# Patient Record
Sex: Male | Born: 1960 | Race: Black or African American | Hispanic: No | Marital: Married | Smoking: Former smoker
Health system: Southern US, Community
[De-identification: ages and names within clinical notes are randomized; demographics above are authoritative.]

## PROBLEM LIST (undated history)

## (undated) DIAGNOSIS — D649 Anemia, unspecified: Secondary | ICD-10-CM

## (undated) DIAGNOSIS — Z800 Family history of malignant neoplasm of digestive organs: Secondary | ICD-10-CM

## (undated) DIAGNOSIS — K219 Gastro-esophageal reflux disease without esophagitis: Secondary | ICD-10-CM

## (undated) DIAGNOSIS — I10 Essential (primary) hypertension: Secondary | ICD-10-CM

## (undated) DIAGNOSIS — E119 Type 2 diabetes mellitus without complications: Secondary | ICD-10-CM

## (undated) DIAGNOSIS — R9431 Abnormal electrocardiogram [ECG] [EKG]: Secondary | ICD-10-CM

## (undated) DIAGNOSIS — E785 Hyperlipidemia, unspecified: Secondary | ICD-10-CM

## (undated) DIAGNOSIS — M7580 Other shoulder lesions, unspecified shoulder: Secondary | ICD-10-CM

## (undated) DIAGNOSIS — M25561 Pain in right knee: Secondary | ICD-10-CM

## (undated) DIAGNOSIS — Z86010 Personal history of colonic polyps: Principal | ICD-10-CM

## (undated) HISTORY — DX: Other shoulder lesions, unspecified shoulder: M75.80

## (undated) HISTORY — DX: Family history of malignant neoplasm of digestive organs: Z80.0

## (undated) HISTORY — DX: Essential (primary) hypertension: I10

## (undated) HISTORY — DX: Personal history of colonic polyps: Z86.010

## (undated) HISTORY — PX: MULTIPLE TOOTH EXTRACTIONS: SHX2053

## (undated) HISTORY — DX: Type 2 diabetes mellitus without complications: E11.9

## (undated) HISTORY — DX: Hyperlipidemia, unspecified: E78.5

## (undated) HISTORY — PX: COLONOSCOPY: SHX174

## (undated) HISTORY — DX: Pain in right knee: M25.561

## (undated) HISTORY — DX: Anemia, unspecified: D64.9

## (undated) HISTORY — PX: POLYPECTOMY: SHX149

## (undated) HISTORY — DX: Abnormal electrocardiogram (ECG) (EKG): R94.31

## (undated) HISTORY — DX: Gastro-esophageal reflux disease without esophagitis: K21.9

---

## 1997-08-23 ENCOUNTER — Emergency Department (HOSPITAL_COMMUNITY): Admission: EM | Admit: 1997-08-23 | Discharge: 1997-08-23 | Payer: Self-pay | Admitting: Emergency Medicine

## 1998-06-04 ENCOUNTER — Encounter: Admission: RE | Admit: 1998-06-04 | Discharge: 1998-09-02 | Payer: Self-pay | Admitting: Family Medicine

## 2007-06-23 ENCOUNTER — Encounter (INDEPENDENT_AMBULATORY_CARE_PROVIDER_SITE_OTHER): Payer: Self-pay | Admitting: Nurse Practitioner

## 2007-06-23 ENCOUNTER — Ambulatory Visit: Payer: Self-pay | Admitting: Internal Medicine

## 2007-06-23 LAB — CONVERTED CEMR LAB
Alkaline Phosphatase: 53 units/L (ref 39–117)
BUN: 11 mg/dL (ref 6–23)
CO2: 23 meq/L (ref 19–32)
Creatinine, Ser: 1.02 mg/dL (ref 0.40–1.50)
Eosinophils Absolute: 0.1 10*3/uL (ref 0.0–0.7)
Eosinophils Relative: 1 % (ref 0–5)
Glucose, Bld: 227 mg/dL — ABNORMAL HIGH (ref 70–99)
HCT: 42.8 % (ref 39.0–52.0)
Hemoglobin: 13.8 g/dL (ref 13.0–17.0)
Lymphocytes Relative: 18 % (ref 12–46)
Lymphs Abs: 1 10*3/uL (ref 0.7–4.0)
MCV: 87.7 fL (ref 78.0–100.0)
Monocytes Absolute: 0.4 10*3/uL (ref 0.1–1.0)
Monocytes Relative: 8 % (ref 3–12)
Platelets: 144 10*3/uL — ABNORMAL LOW (ref 150–400)
RBC: 4.88 M/uL (ref 4.22–5.81)
TSH: 0.809 microintl units/mL (ref 0.350–5.50)
Total Bilirubin: 0.5 mg/dL (ref 0.3–1.2)
WBC: 5.7 10*3/uL (ref 4.0–10.5)

## 2007-06-26 ENCOUNTER — Ambulatory Visit (HOSPITAL_COMMUNITY): Admission: RE | Admit: 2007-06-26 | Discharge: 2007-06-26 | Payer: Self-pay | Admitting: Family Medicine

## 2008-02-27 ENCOUNTER — Ambulatory Visit: Payer: Self-pay | Admitting: Internal Medicine

## 2008-02-27 DIAGNOSIS — E119 Type 2 diabetes mellitus without complications: Secondary | ICD-10-CM | POA: Insufficient documentation

## 2008-02-27 DIAGNOSIS — R9431 Abnormal electrocardiogram [ECG] [EKG]: Secondary | ICD-10-CM | POA: Insufficient documentation

## 2008-04-19 ENCOUNTER — Encounter: Payer: Self-pay | Admitting: Internal Medicine

## 2008-10-24 ENCOUNTER — Ambulatory Visit: Payer: Self-pay | Admitting: Internal Medicine

## 2008-10-24 DIAGNOSIS — M25469 Effusion, unspecified knee: Secondary | ICD-10-CM | POA: Insufficient documentation

## 2008-10-24 DIAGNOSIS — IMO0002 Reserved for concepts with insufficient information to code with codable children: Secondary | ICD-10-CM | POA: Insufficient documentation

## 2008-10-24 DIAGNOSIS — M1710 Unilateral primary osteoarthritis, unspecified knee: Secondary | ICD-10-CM

## 2008-10-24 DIAGNOSIS — M1990 Unspecified osteoarthritis, unspecified site: Secondary | ICD-10-CM | POA: Insufficient documentation

## 2008-10-24 LAB — CONVERTED CEMR LAB
ALT: 21 units/L (ref 0–53)
AST: 18 units/L (ref 0–37)
BUN: 13 mg/dL (ref 6–23)
Basophils Relative: 0.5 % (ref 0.0–3.0)
Bilirubin, Direct: 0 mg/dL (ref 0.0–0.3)
Chloride: 108 meq/L (ref 96–112)
Eosinophils Relative: 1.8 % (ref 0.0–5.0)
GFR calc non Af Amer: 91.67 mL/min (ref >60)
HCT: 37.5 % — ABNORMAL LOW (ref 39.0–52.0)
Ketones, ur: NEGATIVE mg/dL
MCV: 85.7 fL (ref 78.0–100.0)
Monocytes Absolute: 0.5 10*3/uL (ref 0.1–1.0)
Monocytes Relative: 10.4 % (ref 3.0–12.0)
Neutrophils Relative %: 50.5 % (ref 43.0–77.0)
Platelets: 178 10*3/uL (ref 150.0–400.0)
Potassium: 4 meq/L (ref 3.5–5.1)
RBC: 4.37 M/uL (ref 4.22–5.81)
Specific Gravity, Urine: 1.025 (ref 1.000–1.030)
Total Bilirubin: 0.5 mg/dL (ref 0.3–1.2)
Total Protein, Urine: NEGATIVE mg/dL
Total Protein: 6.7 g/dL (ref 6.0–8.3)
Urine Glucose: NEGATIVE mg/dL
WBC: 5 10*3/uL (ref 4.5–10.5)
pH: 6 (ref 5.0–8.0)

## 2008-11-15 ENCOUNTER — Ambulatory Visit: Payer: Self-pay | Admitting: Internal Medicine

## 2010-01-25 IMAGING — CR DG KNEE COMPLETE 4+V*R*
4 series · 4 of 4 positions shown · non-contrast
Comparison: None

CLINICAL DATA: DJD.  Effusion.

RIGHT KNEE - COMPLETE 4+ VIEW

[view not recorded (1 of 4)]
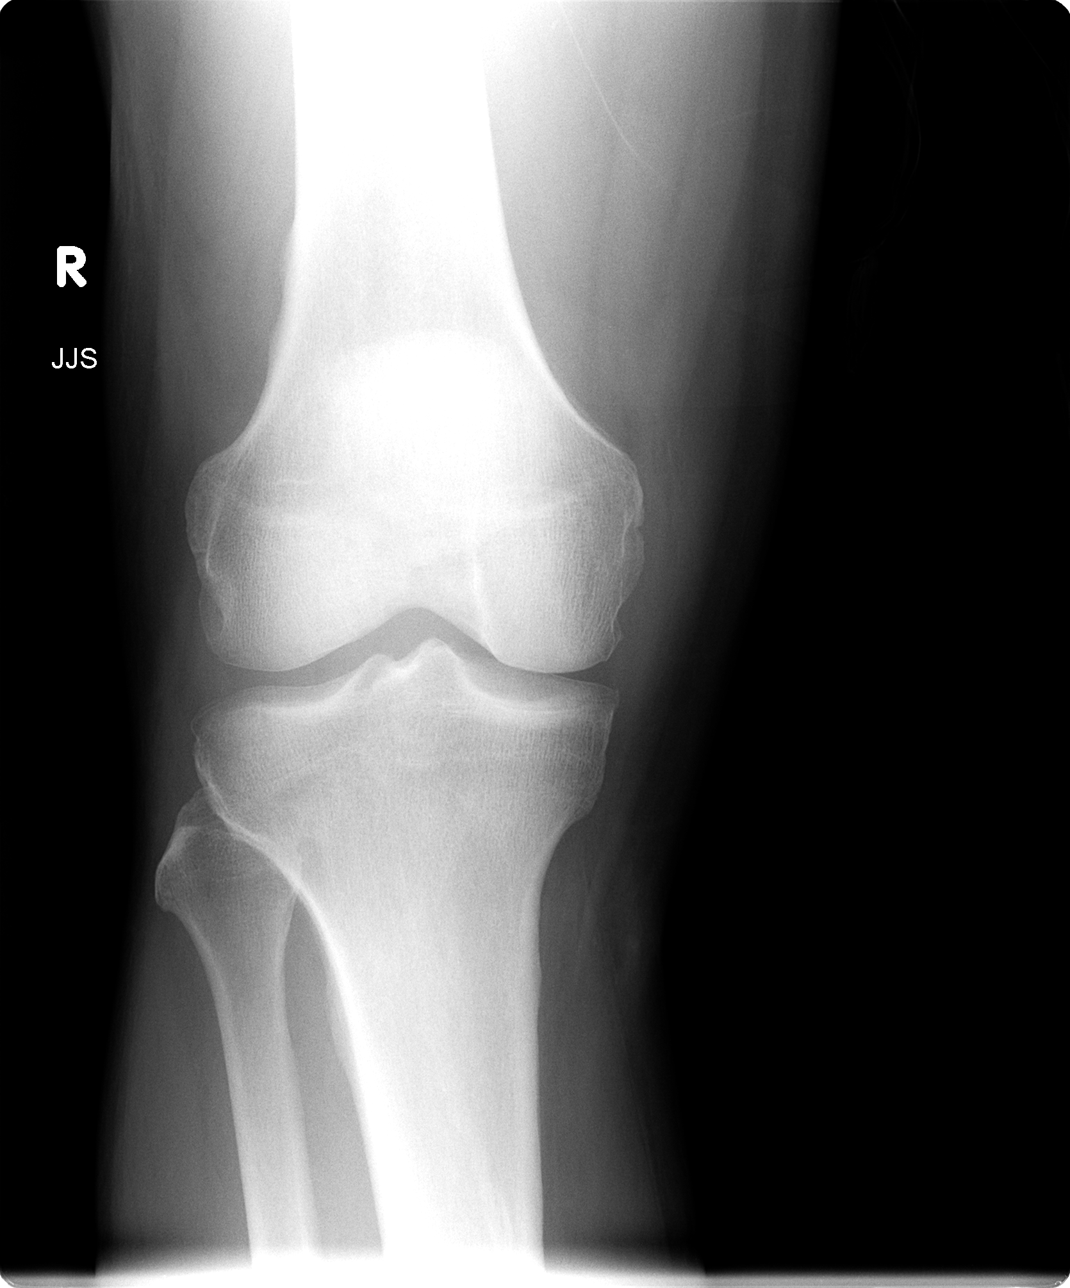

[view not recorded (2 of 4)]
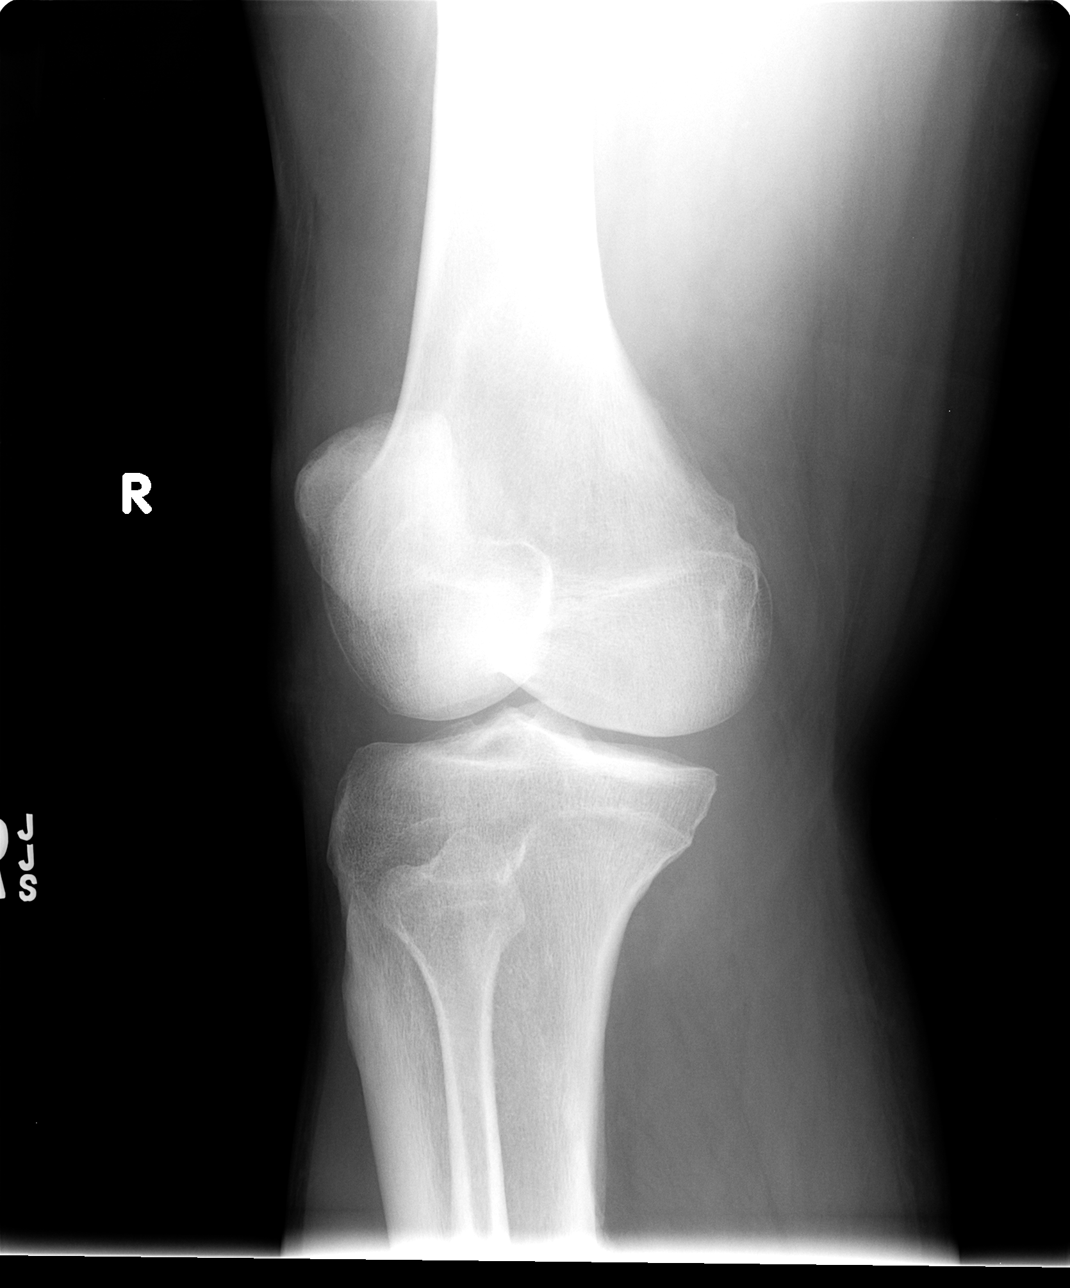

[view not recorded (3 of 4)]
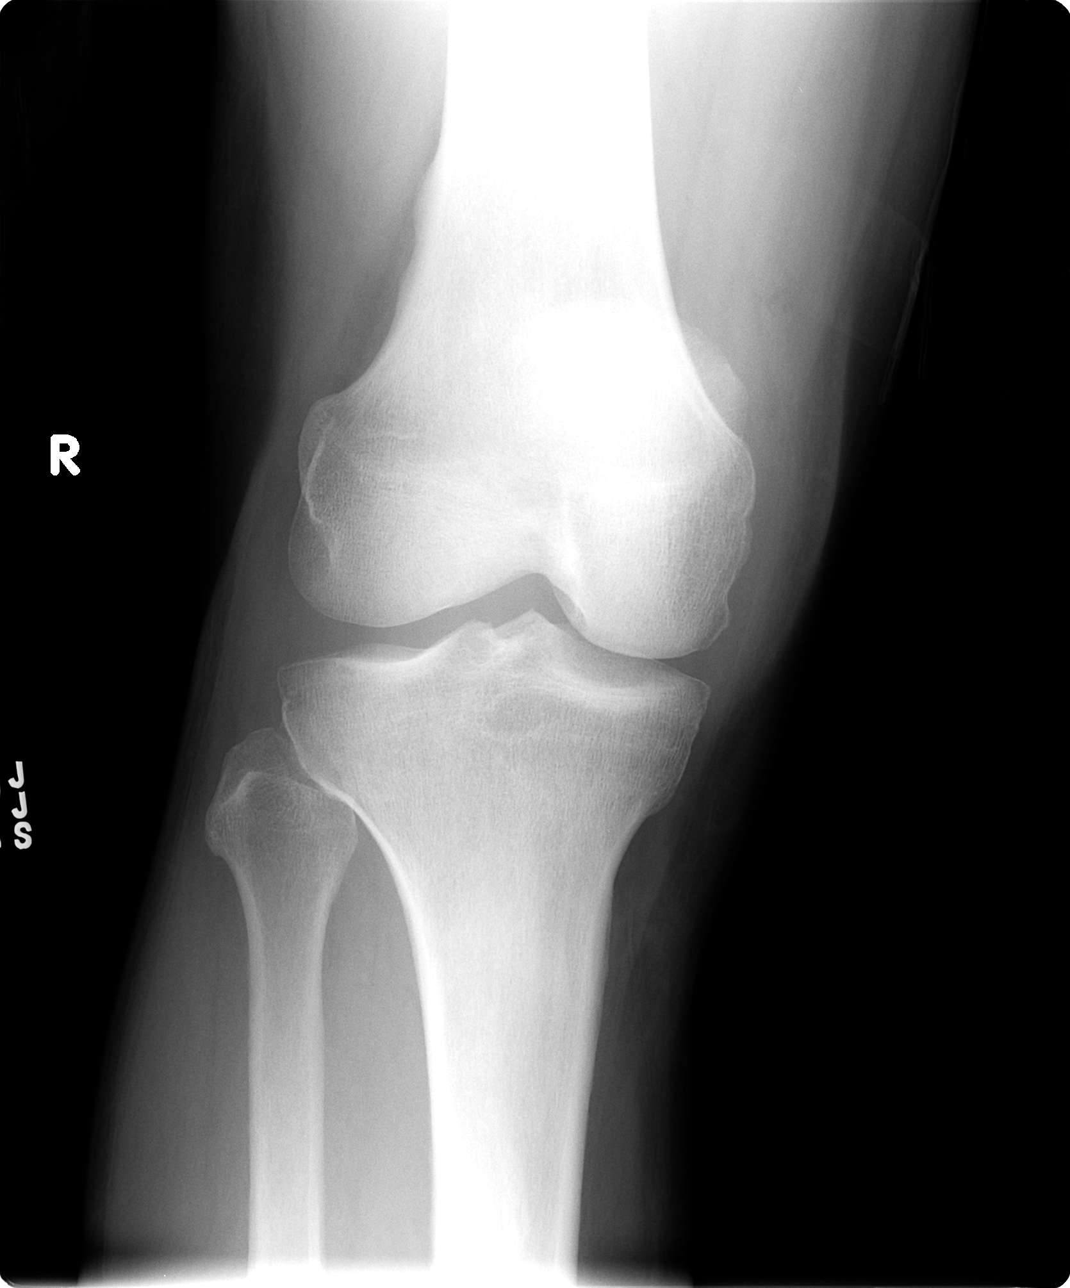

[view not recorded (4 of 4)]
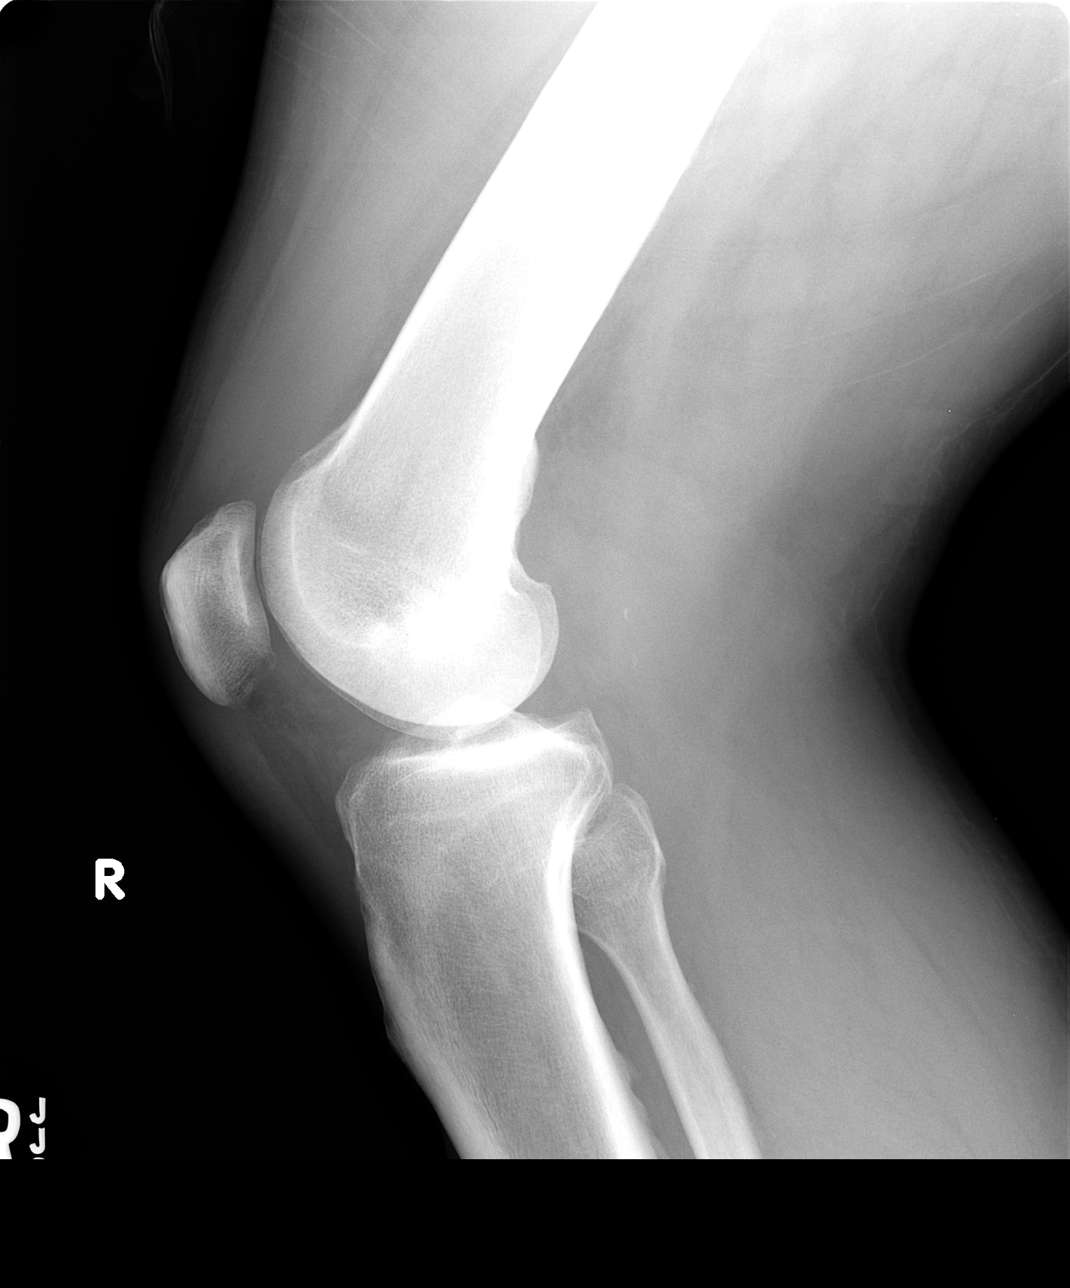

[4 of 4 positions shown; findings below may reference images not displayed]

FINDINGS: There is a joint effusion.  No bony abnormality.  No
joint space narrowing.
IMPRESSION: Right knee joint effusion.

## 2010-04-18 ENCOUNTER — Other Ambulatory Visit: Payer: Self-pay | Admitting: Internal Medicine

## 2010-04-18 ENCOUNTER — Telehealth: Payer: Self-pay | Admitting: Internal Medicine

## 2010-04-18 ENCOUNTER — Other Ambulatory Visit: Payer: BC Managed Care – PPO

## 2010-04-18 ENCOUNTER — Encounter: Payer: Self-pay | Admitting: Internal Medicine

## 2010-04-18 ENCOUNTER — Ambulatory Visit (INDEPENDENT_AMBULATORY_CARE_PROVIDER_SITE_OTHER): Payer: BC Managed Care – PPO | Admitting: Internal Medicine

## 2010-04-18 DIAGNOSIS — IMO0002 Reserved for concepts with insufficient information to code with codable children: Secondary | ICD-10-CM

## 2010-04-18 DIAGNOSIS — D649 Anemia, unspecified: Secondary | ICD-10-CM

## 2010-04-18 DIAGNOSIS — Z23 Encounter for immunization: Secondary | ICD-10-CM

## 2010-04-18 DIAGNOSIS — Z0000 Encounter for general adult medical examination without abnormal findings: Secondary | ICD-10-CM

## 2010-04-18 DIAGNOSIS — E119 Type 2 diabetes mellitus without complications: Secondary | ICD-10-CM

## 2010-04-18 DIAGNOSIS — M1710 Unilateral primary osteoarthritis, unspecified knee: Secondary | ICD-10-CM

## 2010-04-18 DIAGNOSIS — F17200 Nicotine dependence, unspecified, uncomplicated: Secondary | ICD-10-CM

## 2010-04-18 LAB — BASIC METABOLIC PANEL
BUN: 17 mg/dL (ref 6–23)
Calcium: 9 mg/dL (ref 8.4–10.5)
Chloride: 106 mEq/L (ref 96–112)
Creatinine, Ser: 0.7 mg/dL (ref 0.4–1.5)
GFR: 153.5 mL/min (ref >60.00)

## 2010-04-18 LAB — URINALYSIS, ROUTINE W REFLEX MICROSCOPIC
Bilirubin Urine: NEGATIVE
Hgb urine dipstick: NEGATIVE
Total Protein, Urine: NEGATIVE
Urine Glucose: NEGATIVE

## 2010-04-18 LAB — IBC PANEL
Saturation Ratios: 19.3 % — ABNORMAL LOW (ref 20.0–50.0)
Transferrin: 229 mg/dL (ref 212.0–360.0)

## 2010-04-18 LAB — LIPID PANEL
Cholesterol: 158 mg/dL (ref 0–200)
HDL: 55.8 mg/dL (ref >39.00)
LDL Cholesterol: 70 mg/dL (ref 0–99)
Total CHOL/HDL Ratio: 3
Triglycerides: 159 mg/dL — ABNORMAL HIGH (ref 0.0–149.0)
VLDL: 31.8 mg/dL (ref 0.0–40.0)

## 2010-04-18 LAB — HEPATIC FUNCTION PANEL
ALT: 21 U/L (ref 0–53)
Alkaline Phosphatase: 62 U/L (ref 39–117)
Bilirubin, Direct: 0.1 mg/dL (ref 0.0–0.3)
Total Bilirubin: 0.4 mg/dL (ref 0.3–1.2)

## 2010-04-18 LAB — CBC WITH DIFFERENTIAL/PLATELET
Eosinophils Relative: 1.3 % (ref 0.0–5.0)
Lymphocytes Relative: 25.5 % (ref 12.0–46.0)
MCV: 85.1 fl (ref 78.0–100.0)
Monocytes Absolute: 0.4 10*3/uL (ref 0.1–1.0)
Neutrophils Relative %: 65.8 % (ref 43.0–77.0)
Platelets: 152 10*3/uL (ref 150.0–400.0)
WBC: 5.2 10*3/uL (ref 4.5–10.5)

## 2010-04-18 LAB — B12 AND FOLATE PANEL
Folate: 9.3 ng/mL (ref >5.9)
Vitamin B-12: 167 pg/mL — ABNORMAL LOW (ref 211–911)

## 2010-04-18 LAB — CONVERTED CEMR LAB

## 2010-04-18 LAB — HEMOGLOBIN A1C: Hgb A1c MFr Bld: 6.9 % — ABNORMAL HIGH (ref 4.6–6.5)

## 2010-04-22 ENCOUNTER — Ambulatory Visit (INDEPENDENT_AMBULATORY_CARE_PROVIDER_SITE_OTHER): Payer: BC Managed Care – PPO | Admitting: Internal Medicine

## 2010-04-22 ENCOUNTER — Encounter: Payer: Self-pay | Admitting: Internal Medicine

## 2010-04-22 VITALS — BP 104/68 | HR 82 | Temp 97.9°F | Wt 208.0 lb

## 2010-04-22 DIAGNOSIS — E119 Type 2 diabetes mellitus without complications: Secondary | ICD-10-CM

## 2010-04-22 DIAGNOSIS — E538 Deficiency of other specified B group vitamins: Secondary | ICD-10-CM | POA: Insufficient documentation

## 2010-04-22 MED ORDER — CYANOCOBALAMIN 1000 MCG/ML IJ SOLN
1000.0000 ug | Freq: Once | INTRAMUSCULAR | Status: AC
  Administered 2010-04-22: 1000 ug via INTRAMUSCULAR

## 2010-04-22 NOTE — Progress Notes (Signed)
  Subjective:    Patient ID: Ian Salazar, male    DOB: 30-Dec-1960, 50 y.o.   MRN: [id number]  HPI He returns to get his first B12 injection, on recent labs his B12 level was low.    Review of Systems  Constitutional: Negative for activity change, appetite change and fatigue.  Respiratory: Negative for cough, chest tightness and shortness of breath.   Gastrointestinal: Negative for abdominal pain, constipation and blood in stool.  Genitourinary: Negative for urgency and frequency.  Skin: Negative for color change and pallor.  Neurological: Negative for weakness, light-headedness and numbness.       Objective:   Physical Exam  Constitutional: He appears well-nourished. No distress.  HENT:  Head: Normocephalic and atraumatic.  Eyes: Pupils are equal, round, and reactive to light. No scleral icterus.  Neck: No thyromegaly present.  Cardiovascular: Normal rate, regular rhythm and normal heart sounds.  Exam reveals no gallop and no friction rub.   Pulmonary/Chest: No respiratory distress. He has no wheezes. He has no rales. He exhibits no tenderness.  Abdominal: He exhibits no distension and no mass. There is no tenderness. There is no rebound and no guarding.  Musculoskeletal: He exhibits no edema and no tenderness.  Lymphadenopathy:    He has no cervical adenopathy.  Neurological: No cranial nerve deficit.  Skin: Skin is dry. No rash noted. No erythema. No pallor.  Psychiatric: He has a normal mood and affect. Judgment and thought content normal.          Assessment & Plan:

## 2010-04-22 NOTE — Assessment & Plan Note (Signed)
His recent A1C is very good and he has good renal function, so he will continue same meds and lifestyle mods

## 2010-04-22 NOTE — Assessment & Plan Note (Signed)
Start B12 injections.   

## 2010-04-22 NOTE — Assessment & Plan Note (Signed)
Summary: LAST OV:  2010--MEDS---OV---STC   Vital Signs:  Patient profile:   50 year old male Height:      70 inches Weight:      209 pounds BMI:     30.10 O2 Sat:      97 % on Room air Temp:     97.9 degrees F oral Pulse rate:   81 / minute Pulse rhythm:   regular Resp:     16 per minute BP sitting:   118 / 72  (left arm) Cuff size:   large  Vitals Entered By: Rock Nephew CMA (April 18, 2010 8:43 AM)  Nutrition Counseling: Patient's BMI is greater than 25 and therefore counseled on weight management options.  O2 Flow:  Room air CC: follow-up visit, med refills and c/o occ dizziness, Preventive Care Is Patient Diabetic? Yes Did you bring your meter with you today? Yes Pain Assessment Patient in pain? no       Does patient need assistance? Functional Status Self care Ambulation Normal   Primary Care Provider:  Etta Grandchild MD  CC:  follow-up visit, med refills and c/o occ dizziness, and Preventive Care.  History of Present Illness: He returns for a complete physical but he also has chronic medical problems that have not been addressed in 1.5 years.  Preventive Screening-Counseling & Management  Alcohol-Tobacco     Alcohol drinks/day: 2     Alcohol type: beer     >5/day in last 3 mos: no     Alcohol Counseling: not indicated; use of alcohol is not excessive or problematic     Feels need to cut down: no     Feels annoyed by complaints: no     Feels guilty re: drinking: no     Needs 'eye opener' in am: no     Smoking Status: current     Smoking Cessation Counseling: yes     Smoke Cessation Stage: contemplative     Packs/Day: 1     Year Started: 1990     Pack years: 20     Cans of tobacco/week: no     Passive Smoke Exposure: no     Tobacco Counseling: to quit use of tobacco products  Hep-HIV-STD-Contraception     Hepatitis Risk: no risk noted     HIV Risk: no risk noted     STD Risk: no risk noted     Dental Visit-last 6 months yes     Dental Care  Counseling: to seek dental care; no dental care within six months     TSE monthly: yes     Testicular SE Education/Counseling to perform regular STE  Safety-Violence-Falls     Seat Belt Use: yes     Helmet Use: n/a     Firearms in the Home: no firearms in the home     Smoke Detectors: yes      Sexual History:  currently monogamous.        Drug Use:  never.        Blood Transfusions:  no.    Clinical Review Panels:  Immunizations   Last Tetanus Booster:  Tdap (11/15/2008)   Last Flu Vaccine:  Fluvax 3+ (04/18/2010)  Diabetes Management   HgBA1C:  7.0 (10/24/2008)   Creatinine:  1.1 (10/24/2008)   Last Dilated Eye Exam:  normal (04/12/2007)   Last Foot Exam:  yes (04/18/2010)   Last Flu Vaccine:  Fluvax 3+ (04/18/2010)  CBC   WBC:  5.0 (10/24/2008)  RBC:  4.37 (10/24/2008)   Hgb:  12.7 (10/24/2008)   Hct:  37.5 (10/24/2008)   Platelets:  178.0 (10/24/2008)   MCV  85.7 (10/24/2008)   MCHC  33.9 (10/24/2008)   RDW  12.6 (10/24/2008)   PMN:  50.5 (10/24/2008)   Lymphs:  36.8 (10/24/2008)   Monos:  10.4 (10/24/2008)   Eosinophils:  1.8 (10/24/2008)   Basophil:  0.5 (10/24/2008)  Complete Metabolic Panel   Glucose:  137 (10/24/2008)   Sodium:  143 (10/24/2008)   Potassium:  4.0 (10/24/2008)   Chloride:  108 (10/24/2008)   CO2:  31 (10/24/2008)   BUN:  13 (10/24/2008)   Creatinine:  1.1 (10/24/2008)   Albumin:  3.5 (10/24/2008)   Total Protein:  6.7 (10/24/2008)   Calcium:  9.2 (10/24/2008)   Total Bili:  0.5 (10/24/2008)   Alk Phos:  53 (10/24/2008)   SGPT (ALT):  21 (10/24/2008)   SGOT (AST):  18 (10/24/2008)   Medications Prior to Update: 1)  Metformin Hcl 500 Mg Tabs (Metformin Hcl) .... Take 1 Tablet By Mouth Two Times A Day 2)  Onetouch Ultra 2 W/device Kit (Blood Glucose Monitoring Suppl) .... Use As Directed 3)  Onetouch Ultra Test  Strp (Glucose Blood) .... Use Up Toi Three Times A Day For Blood Sugars 4)  Celebrex 200 Mg Caps (Celecoxib) .... Once  Daily For Arthritis  Current Medications (verified): 1)  Metformin Hcl 500 Mg Tabs (Metformin Hcl) .... Take 1 Tablet By Mouth Two Times A Day 2)  Onetouch Ultra 2 W/device Kit (Blood Glucose Monitoring Suppl) .... Use Two Times A Day As Directed 3)  Onetouch Ultra Test  Strp (Glucose Blood) .... Use Two Times A Day As Directed  Allergies (verified): No Known Drug Allergies  Past History:  Family History: Last updated: 02/27/2008 Family History Diabetes 1st degree relative  Social History: Last updated: 02/27/2008 Occupation: [employer] account executive Married Current Smoker Alcohol use-yes Drug use-no Regular exercise-no  Risk Factors: Alcohol Use: 2 (04/18/2010) >5 drinks/d w/in last 3 months: no (04/18/2010) Exercise: no (02/27/2008)  Risk Factors: Smoking Status: current (04/18/2010) Packs/Day: 1 (04/18/2010) Cans of tobacco/wk: no (04/18/2010) Passive Smoke Exposure: no (04/18/2010)  Past Medical History: Diabetes mellitus, type II Left rotator cuff tendonitis Abnormal EKG but normal ETT 5 years ago Anemia-NOS  Past Surgical History: Reviewed history from 02/27/2008 and no changes required. Denies surgical history  Family History: Reviewed history from 02/27/2008 and no changes required. Family History Diabetes 1st degree relative  Social History: Reviewed history from 02/27/2008 and no changes required. Occupation: Production designer, theatre/television/film Married Current Smoker Alcohol use-yes Drug use-no Regular exercise-no Hepatitis Risk:  no risk noted HIV Risk:  no risk noted STD Risk:  no risk noted Dental Care w/in 6 mos.:  yes Seat Belt Use:  yes Sexual History:  currently monogamous Drug Use:  never Blood Transfusions:  no  Review of Systems  The patient denies anorexia, fever, weight loss, weight gain, chest pain, syncope, dyspnea on exertion, peripheral edema, prolonged cough, headaches, hemoptysis, abdominal pain, melena, hematochezia, severe  indigestion/heartburn, hematuria, muscle weakness, suspicious skin lesions, transient blindness, difficulty walking, depression, enlarged lymph nodes, angioedema, and testicular masses.   MS:  Denies joint pain, joint redness, joint swelling, loss of strength, low back pain, muscle aches, and stiffness. Endo:  Denies cold intolerance, excessive hunger, excessive thirst, excessive urination, heat intolerance, polyuria, and weight change. Heme:  Denies abnormal bruising, bleeding, enlarge lymph nodes, fevers, pallor, and skin discoloration.  Physical Exam  General:  alert, well-developed, well-nourished, well-hydrated, appropriate dress, normal appearance, healthy-appearing, cooperative to examination, good hygiene, and overweight-appearing.   Head:  normocephalic, atraumatic, no abnormalities observed, and no abnormalities palpated.   Eyes:  vision grossly intact, pupils equal, pupils round, and no injection.   Ears:  R ear normal and L ear normal.   Nose:  External nasal examination shows no deformity or inflammation. Nasal mucosa are pink and moist without lesions or exudates. Mouth:  Oral mucosa and oropharynx without lesions or exudates.  Teeth in good repair. Neck:  No deformities, masses, or tenderness noted. Lungs:  Normal respiratory effort, chest expands symmetrically. Lungs are clear to auscultation, no crackles or wheezes. Heart:  Normal rate and regular rhythm. S1 and S2 normal without gallop, murmur, click, rub or other extra sounds. Abdomen:  soft, non-tender, normal bowel sounds, no distention, no masses, no guarding, no rigidity, no rebound tenderness, no abdominal hernia, no inguinal hernia, no hepatomegaly, and no splenomegaly.   Rectal:  No external abnormalities noted. Normal sphincter tone. No rectal masses or tenderness. Genitalia:  circumcised, no hydrocele, no varicocele, no scrotal masses, no testicular masses or atrophy, no cutaneous lesions, and no urethral discharge.     Prostate:  Prostate gland firm and smooth, no enlargement, nodularity, tenderness, mass, asymmetry or induration. Msk:  No deformity or scoliosis noted of thoracic or lumbar spine.   Pulses:  R and L carotid,radial,femoral,dorsalis pedis and posterior tibial pulses are full and equal bilaterally Extremities:  No clubbing, cyanosis, edema, or deformity noted with normal full range of motion of all joints.   Neurologic:  No cranial nerve deficits noted. Station and gait are normal. Plantar reflexes are down-going bilaterally. DTRs are symmetrical throughout. Sensory, motor and coordinative functions appear intact. Skin:  Intact without suspicious lesions or rashes Cervical Nodes:  No lymphadenopathy noted Axillary Nodes:  No palpable lymphadenopathy Inguinal Nodes:  No significant adenopathy Psych:  Cognition and judgment appear intact. Alert and cooperative with normal attention span and concentration. No apparent delusions, illusions, hallucinations Additional Exam:  EKS is normal  Diabetes Management Exam:    Foot Exam (with socks and/or shoes not present):       Sensory-Pinprick/Light touch:          Left medial foot (L-4): normal          Left dorsal foot (L-5): normal          Left lateral foot (S-1): normal          Right medial foot (L-4): normal          Right dorsal foot (L-5): normal          Right lateral foot (S-1): normal       Sensory-Monofilament:          Left foot: normal          Right foot: normal       Inspection:          Left foot: normal          Right foot: normal       Nails:          Left foot: normal          Right foot: normal   Impression & Recommendations:  Problem # 1:  ROUTINE GENERAL MEDICAL EXAM@HEALTH  CARE FACL (ICD-V70.0) Assessment New  Orders: TLB-Lipid Panel (80061-LIPID) TLB-BMP (Basic Metabolic Panel-BMET) (80048-METABOL) TLB-CBC Platelet - w/Differential (85025-CBCD) TLB-Hepatic/Liver Function Pnl (80076-HEPATIC) TLB-TSH (Thyroid  Stimulating Hormone) (84443-TSH) TLB-B12 +  Folate Pnl (82746_82607-B12/FOL) TLB-IBC Pnl (Iron/FE;Transferrin) (83550-IBC) TLB-A1C / Hgb A1C (Glycohemoglobin) (83036-A1C) TLB-PSA (Prostate Specific Antigen) (84153-PSA) TLB-Udip w/ Micro (81001-URINE) EKG w/ Interpretation (93000) Hemoccult Guaiac-1 spec.(in office) (45409) Gastroenterology Referral (GI)  Td Booster: Tdap (11/15/2008)   Flu Vax: Fluvax 3+ (04/18/2010)   TSH: 2.72 (10/24/2008)   HgbA1C: 7.0 (10/24/2008)    Discussed using sunscreen, use of alcohol, drug use, self testicular exam, routine dental care, routine eye care, routine physical exam, seat belts, multiple vitamins,  and recommendations for immunizations.  Discussed exercise and checking cholesterol.  Also recommend checking PSA.  Problem # 2:  ANEMIA-NOS (ICD-285.9) Assessment: Unchanged  Orders: TLB-Lipid Panel (80061-LIPID) TLB-BMP (Basic Metabolic Panel-BMET) (80048-METABOL) TLB-CBC Platelet - w/Differential (85025-CBCD) TLB-Hepatic/Liver Function Pnl (80076-HEPATIC) TLB-TSH (Thyroid Stimulating Hormone) (84443-TSH) TLB-B12 + Folate Pnl (81191_47829-F62/ZHY) TLB-IBC Pnl (Iron/FE;Transferrin) (83550-IBC) TLB-A1C / Hgb A1C (Glycohemoglobin) (83036-A1C) TLB-PSA (Prostate Specific Antigen) (84153-PSA) TLB-Udip w/ Micro (81001-URINE) Hemoccult Guaiac-1 spec.(in office) (86578) Gastroenterology Referral (GI)  Hgb: 12.7 (10/24/2008)   Hct: 37.5 (10/24/2008)   Platelets: 178.0 (10/24/2008) RBC: 4.37 (10/24/2008)   RDW: 12.6 (10/24/2008)   WBC: 5.0 (10/24/2008) MCV: 85.7 (10/24/2008)   MCHC: 33.9 (10/24/2008) TSH: 2.72 (10/24/2008)  Problem # 3:  DEGENERATIVE JOINT DISEASE, KNEE (ICD-715.96) Assessment: Improved  The following medications were removed from the medication list:    Celebrex 200 Mg Caps (Celecoxib) ..... Once daily for arthritis  Problem # 4:  DIABETES MELLITUS, TYPE II (ICD-250.00) Assessment: Unchanged  His updated medication list for this  problem includes:    Metformin Hcl 500 Mg Tabs (Metformin hcl) .Marland Kitchen... Take 1 tablet by mouth two times a day  Orders: TLB-Lipid Panel (80061-LIPID) TLB-BMP (Basic Metabolic Panel-BMET) (80048-METABOL) TLB-CBC Platelet - w/Differential (85025-CBCD) TLB-Hepatic/Liver Function Pnl (80076-HEPATIC) TLB-TSH (Thyroid Stimulating Hormone) (84443-TSH) TLB-B12 + Folate Pnl (46962_95284-X32/GMW) TLB-IBC Pnl (Iron/FE;Transferrin) (83550-IBC) TLB-A1C / Hgb A1C (Glycohemoglobin) (83036-A1C) TLB-PSA (Prostate Specific Antigen) (84153-PSA) TLB-Udip w/ Micro (81001-URINE) Ophthalmology Referral (Ophthalmology) Tobacco use cessation intermediate 3-10 minutes (10272)  Labs Reviewed: Creat: 1.1 (10/24/2008)     Last Eye Exam: normal (04/12/2007) Reviewed HgBA1c results: 7.0 (10/24/2008)  Problem # 5:  TOBACCO USE (ICD-305.1) Assessment: New  Orders: Tobacco use cessation intermediate 3-10 minutes (53664)  Encouraged smoking cessation and discussed different methods for smoking cessation.   Complete Medication List: 1)  Metformin Hcl 500 Mg Tabs (Metformin hcl) .... Take 1 tablet by mouth two times a day 2)  Onetouch Ultra 2 W/device Kit (Blood glucose monitoring suppl) .... Use two times a day as directed 3)  Onetouch Ultra Test Strp (Glucose blood) .... Use two times a day as directed  Other Orders: Flu Vaccine 46yrs + (40347) Admin 1st Vaccine (42595)  Colorectal Screening:  Current Recommendations:    Hemoccult: NEG X 1 today    Colonoscopy recommended: scheduled with G.I.  PSA Screening:    Reviewed PSA screening recommendations: PSA ordered  Immunization & Chemoprophylaxis:    Tetanus vaccine: Tdap  (11/15/2008)    Influenza vaccine: Fluvax 3+  (04/18/2010)  Patient Instructions: 1)  Please schedule a follow-up appointment in 1 month. 2)  Tobacco is very bad for your health and your loved ones! You Should stop smoking!. 3)  Stop Smoking Tips: Choose a Quit date. Cut down before  the Quit date. decide what you will do as a substitute when you feel the urge to smoke(gum,toothpick,exercise). 4)  It is important that you exercise regularly at least 20 minutes 5 times a week. If you develop chest pain, have severe difficulty breathing,  or feel very tired , stop exercising immediately and seek medical attention. 5)  You need to lose weight. Consider a lower calorie diet and regular exercise.  6)  Schedule a colonoscopy/sigmoidoscopy to help detect colon cancer. 7)  Take an Aspirin every day. 8)  Check your blood sugars regularly. If your readings are usually above 200 or below 70 you should contact our office. 9)  It is important that your Diabetic A1c level is checked every 3 months. 10)  See your eye doctor yearly to check for diabetic eye damage. 11)  Check your feet each night for sore areas, calluses or signs of infection. 12)  Check your Blood Pressure regularly. If it is above 130/80: you should make an appointment. Prescriptions: ONETOUCH ULTRA TEST  STRP (GLUCOSE BLOOD) Use two times a day as directed  #180 x 3   Entered and Authorized by:   Etta Grandchild MD   Signed by:   Etta Grandchild MD on 04/18/2010   Method used:   Print then Give to Patient   RxID:   [id number] METFORMIN HCL 500 MG TABS (METFORMIN HCL) Take 1 tablet by mouth two times a day  #180 Tablet x 1   Entered and Authorized by:   Etta Grandchild MD   Signed by:   Etta Grandchild MD on 04/18/2010   Method used:   Print then Give to Patient   RxID:   [id number]    Orders Added: 1)  Flu Vaccine 48yrs + [52841] 2)  Admin 1st Vaccine [90471] 3)  TLB-Lipid Panel [80061-LIPID] 4)  TLB-BMP (Basic Metabolic Panel-BMET) [80048-METABOL] 5)  TLB-CBC Platelet - w/Differential [85025-CBCD] 6)  TLB-Hepatic/Liver Function Pnl [80076-HEPATIC] 7)  TLB-TSH (Thyroid Stimulating Hormone) [84443-TSH] 8)  TLB-B12 + Folate Pnl [82746_82607-B12/FOL] 9)  TLB-IBC Pnl (Iron/FE;Transferrin)  [83550-IBC] 10)  TLB-A1C / Hgb A1C (Glycohemoglobin) [83036-A1C] 11)  TLB-PSA (Prostate Specific Antigen) [84153-PSA] 12)  TLB-Udip w/ Micro [81001-URINE] 13)  Ophthalmology Referral [Ophthalmology] 14)  EKG w/ Interpretation [93000] 15)  Hemoccult Guaiac-1 spec.(in office) [82270] 16)  Tobacco use cessation intermediate 3-10 minutes [99406] 17)  Gastroenterology Referral [GI] 18)  New Patient Level IV [99204] 19)  Est. Patient 40-64 years [99396]   Immunizations Administered:  Influenza Vaccine # 1:    Vaccine Type: Fluvax 3+    Site: left deltoid    Mfr: GlaxoSmithKline    Dose: 0.5 ml    Route: IM    Given by: Rock Nephew CMA    Exp. Date: 08/02/2010    Lot #: [id number]    VIS given: 08/27/09 version given April 18, 2010.   Immunizations Administered:  Influenza Vaccine # 1:    Vaccine Type: Fluvax 3+    Site: left deltoid    Mfr: GlaxoSmithKline    Dose: 0.5 ml    Route: IM    Given by: Rock Nephew CMA    Exp. Date: 08/02/2010    Lot #: [id number]    VIS given: 08/27/09 version given April 18, 2010.

## 2010-04-22 NOTE — Patient Instructions (Signed)
Please return for your second B 12 injection in 2 weeks

## 2010-04-22 NOTE — Letter (Signed)
Summary: Lipid Letter  [facility]  [address] [place], [place] [address]   Phone: [phone]  Fax: [phone]    04/18/2010  Ian Salazar [address] [place], [place]  [address]  Dear Ian Salazar:  We have carefully reviewed your last lipid profile from 04/18/2010 and the results are noted below with a summary of recommendations for lipid management.    Cholesterol:       158     Goal: <200   HDL "good" Cholesterol:   25.36     Goal: >40   LDL "bad" Cholesterol:   70     Goal: <100   Triglycerides:       159.0     Goal: <150    YOUR B12 LEVEL IS LOW, PLEASE FOLLOW-UP SOON!    TLC Diet (Therapeutic Lifestyle Change): Saturated Fats & Transfatty acids should be kept < 7% of total calories ***Reduce Saturated Fats Polyunstaurated Fat can be up to 10% of total calories Monounsaturated Fat Fat can be up to 20% of total calories Total Fat should be no greater than 25-35% of total calories Carbohydrates should be 50-60% of total calories Protein should be approximately 15% of total calories Fiber should be at least 20-30 grams a day ***Increased fiber may help lower LDL Total Cholesterol should be < 200mg /day Consider adding plant stanol/sterols to diet (example: Benacol spread) ***A higher intake of unsaturated fat may reduce Triglycerides and Increase HDL    Adjunctive Measures (may lower LIPIDS and reduce risk of Heart Attack) include: Aerobic Exercise (20-30 minutes 3-4 times a week) Limit Alcohol Consumption Weight Reduction Aspirin 75-81 mg a day by mouth (if not allergic or contraindicated) Dietary Fiber 20-30 grams a day by mouth     Current Medications: 1)    Metformin Hcl 500 Mg Tabs (Metformin hcl) .... Take 1 tablet by mouth two times a day 2)    Onetouch Ultra 2 W/device Kit (Blood glucose monitoring suppl) .... Use two times a day as directed 3)    Onetouch Ultra Test  Strp (Glucose blood) .... Use two times a day as directed 4)    Celebrex  200 Mg Caps (Celecoxib) .... Take 1 tablet by mouth once a day  If you have any questions, please call. We appreciate being able to work with you.   Sincerely,    [facility] Ian Grandchild MD

## 2010-04-22 NOTE — Progress Notes (Signed)
  Phone Note Outgoing Call   Summary of Call: LA - his B12 level is low, please ask him to come in for a B12 injection, thanks, TJ Initial call taken by: Etta Grandchild MD,  April 18, 2010 12:41 PM  Follow-up for Phone Call       Follow-up by: Etta Grandchild MD,  April 18, 2010 12:40 PM  Additional Follow-up for Phone Call Additional follow up Details #1::        Patient notified per MD and transferred to scheduling to set up appt.Alvy Beal Archie CMA  April 18, 2010 1:36 PM

## 2010-05-06 ENCOUNTER — Ambulatory Visit: Payer: BC Managed Care – PPO | Admitting: *Deleted

## 2010-05-20 ENCOUNTER — Ambulatory Visit: Payer: BC Managed Care – PPO | Admitting: *Deleted

## 2010-07-01 ENCOUNTER — Encounter: Payer: Self-pay | Admitting: Gastroenterology

## 2011-01-15 ENCOUNTER — Other Ambulatory Visit: Payer: Self-pay | Admitting: Internal Medicine

## 2011-07-03 ENCOUNTER — Other Ambulatory Visit (INDEPENDENT_AMBULATORY_CARE_PROVIDER_SITE_OTHER): Payer: BC Managed Care – PPO

## 2011-07-03 ENCOUNTER — Other Ambulatory Visit: Payer: Self-pay

## 2011-07-03 ENCOUNTER — Ambulatory Visit (INDEPENDENT_AMBULATORY_CARE_PROVIDER_SITE_OTHER)
Admission: RE | Admit: 2011-07-03 | Discharge: 2011-07-03 | Disposition: A | Discharge status: Home / Self Care | Payer: BC Managed Care – PPO | Source: Ambulatory Visit | Attending: Internal Medicine | Admitting: Internal Medicine

## 2011-07-03 ENCOUNTER — Encounter: Payer: Self-pay | Admitting: Internal Medicine

## 2011-07-03 ENCOUNTER — Ambulatory Visit (INDEPENDENT_AMBULATORY_CARE_PROVIDER_SITE_OTHER): Payer: BC Managed Care – PPO | Admitting: Internal Medicine

## 2011-07-03 VITALS — BP 112/62 | HR 86 | Temp 98.2°F | Ht 66.5 in | Wt 208.8 lb

## 2011-07-03 DIAGNOSIS — R091 Pleurisy: Secondary | ICD-10-CM

## 2011-07-03 DIAGNOSIS — F17200 Nicotine dependence, unspecified, uncomplicated: Secondary | ICD-10-CM

## 2011-07-03 DIAGNOSIS — E119 Type 2 diabetes mellitus without complications: Secondary | ICD-10-CM

## 2011-07-03 DIAGNOSIS — H6690 Otitis media, unspecified, unspecified ear: Secondary | ICD-10-CM

## 2011-07-03 LAB — BASIC METABOLIC PANEL
BUN: 11 mg/dL (ref 6–23)
CO2: 27 mEq/L (ref 19–32)
Calcium: 9.2 mg/dL (ref 8.4–10.5)
Creatinine, Ser: 0.8 mg/dL (ref 0.4–1.5)
Glucose, Bld: 120 mg/dL — ABNORMAL HIGH (ref 70–99)

## 2011-07-03 LAB — CBC WITH DIFFERENTIAL/PLATELET
Basophils Absolute: 0 10*3/uL (ref 0.0–0.1)
Eosinophils Absolute: 0.1 10*3/uL (ref 0.0–0.7)
Hemoglobin: 12.3 g/dL — ABNORMAL LOW (ref 13.0–17.0)
Lymphocytes Relative: 16.5 % (ref 12.0–46.0)
MCHC: 32.8 g/dL (ref 30.0–36.0)
Neutro Abs: 5.2 10*3/uL (ref 1.4–7.7)
Neutrophils Relative %: 73 % (ref 43.0–77.0)
RDW: 13.4 % (ref 11.5–14.6)

## 2011-07-03 LAB — HEPATIC FUNCTION PANEL
AST: 29 U/L (ref 0–37)
Alkaline Phosphatase: 55 U/L (ref 39–117)
Bilirubin, Direct: 0.1 mg/dL (ref 0.0–0.3)
Total Bilirubin: 0.4 mg/dL (ref 0.3–1.2)

## 2011-07-03 MED ORDER — MECLIZINE HCL 25 MG PO TABS: 25.0000 mg | ORAL_TABLET | Freq: Three times a day (TID) | ORAL | Status: AC | PRN

## 2011-07-03 MED ORDER — CELECOXIB 200 MG PO CAPS: 200.0000 mg | ORAL_CAPSULE | Freq: Every day | ORAL | Status: DC

## 2011-07-03 MED ORDER — METFORMIN HCL 500 MG PO TABS: 500.0000 mg | ORAL_TABLET | Freq: Two times a day (BID) | ORAL | Status: DC

## 2011-07-03 MED ORDER — AMOXICILLIN-POT CLAVULANATE 875-125 MG PO TABS: 1.0000 | ORAL_TABLET | Freq: Two times a day (BID) | ORAL | Status: AC

## 2011-07-03 NOTE — Patient Instructions (Addendum)
It was good to see you today. Test(s) ordered today. Your results will be called to you after review (48-72hours after test completion). If any changes need to be made, you will be notified at that time. Augmentin antibiotics and meclizine for dizzy/ear symptoms -  Other Medications reviewed, no changes at this time. Refill on medication(s) as discussed today.  Your prescription(s) have been submitted to your pharmacy. Please take as directed and contact our office if you believe you are having problem(s) with the medication(s). Please schedule followup in 3-4 months with Dr Yetta Barre, call sooner if problems. Smoking Cessation, Tips for Success YOU CAN QUIT SMOKING If you are ready to quit smoking, congratulations! You have chosen to help yourself be healthier. Cigarettes bring nicotine, tar, carbon monoxide, and other irritants into your body. Your lungs, heart, and blood vessels will be able to work better without these poisons. There are many different ways to quit smoking. Nicotine gum, nicotine patches, a nicotine inhaler, or nicotine nasal spray can help with physical craving. Hypnosis, support groups, and medicines help break the habit of smoking. Here are some tips to help you quit for good.  Throw away all cigarettes.   Clean and remove all ashtrays from your home, work, and car.   On a card, write down your reasons for quitting. Carry the card with you and read it when you get the urge to smoke.   Cleanse your body of nicotine. Drink enough water and fluids to keep your urine clear or pale yellow. Do this after quitting to flush the nicotine from your body.   Learn to predict your moods. Do not let a bad situation be your excuse to have a cigarette. Some situations in your life might tempt you into wanting a cigarette.   Never have "just one" cigarette. It leads to wanting another and another. Remind yourself of your decision to quit.   Change habits associated with smoking. If you  smoked while driving or when feeling stressed, try other activities to replace smoking. Stand up when drinking your coffee. Brush your teeth after eating. Sit in a different chair when you read the paper. Avoid alcohol while trying to quit, and try to drink fewer caffeinated beverages. Alcohol and caffeine may urge you to smoke.   Avoid foods and drinks that can trigger a desire to smoke, such as sugary or spicy foods and alcohol.   Ask people who smoke not to smoke around you.   Have something planned to do right after eating or having a cup of coffee. Take a walk or exercise to perk you up. This will help to keep you from overeating.   Try a relaxation exercise to calm you down and decrease your stress. Remember, you may be tense and nervous for the first 2 weeks after you quit, but this will pass.   Find new activities to keep your hands busy. Play with a pen, coin, or rubber band. Doodle or draw things on paper.   Brush your teeth right after eating. This will help cut down on the craving for the taste of tobacco after meals. You can try mouthwash, too.   Use oral substitutes, such as lemon drops, carrots, a cinnamon stick, or chewing gum, in place of cigarettes. Keep them handy so they are available when you have the urge to smoke.   When you have the urge to smoke, try deep breathing.   Designate your home as a nonsmoking area.   If you are  a heavy smoker, ask your caregiver about a prescription for nicotine chewing gum. It can ease your withdrawal from nicotine.   Reward yourself. Set aside the cigarette money you save and buy yourself something nice.   Look for support from others. Join a support group or smoking cessation program. Ask someone at home or at work to help you with your plan to quit smoking.   Always ask yourself, "Do I need this cigarette or is this just a reflex?" Tell yourself, "Today, I choose not to smoke," or "I do not want to smoke." You are reminding yourself  of your decision to quit, even if you do smoke a cigarette.  HOW WILL I FEEL WHEN I QUIT SMOKING?  The benefits of not smoking start within days of quitting.   You may have symptoms of withdrawal because your body is used to nicotine (the addictive substance in cigarettes). You may crave cigarettes, be irritable, feel very hungry, cough often, get headaches, or have difficulty concentrating.   The withdrawal symptoms are only temporary. They are strongest when you first quit but will go away within 10 to 14 days.   When withdrawal symptoms occur, stay in control. Think about your reasons for quitting. Remind yourself that these are signs that your body is healing and getting used to being without cigarettes.   Remember that withdrawal symptoms are easier to treat than the major diseases that smoking can cause.   Even after the withdrawal is over, expect periodic urges to smoke. However, these cravings are generally short-lived and will go away whether you smoke or not. Do not smoke!   If you relapse and smoke again, do not lose hope. Most smokers quit 3 times before they are successful.   If you relapse, do not give up! Plan ahead and think about what you will do the next time you get the urge to smoke.  LIFE AS A NONSMOKER: MAKE IT FOR A MONTH, MAKE IT FOR LIFE Day 1: Hang this page where you will see it every day. Day 2: Get rid of all ashtrays, matches, and lighters. Day 3: Drink water. Breathe deeply between sips. Day 4: Avoid places with smoke-filled air, such as bars, clubs, or the smoking section of restaurants. Day 5: Keep track of how much money you save by not smoking. Day 6: Avoid boredom. Keep a good book with you or go to the movies. Day 7: Reward yourself! One week without smoking! Day 8: Make a dental appointment to get your teeth cleaned. Day 9: Decide how you will turn down a cigarette before it is offered to you. Day 10: Review your reasons for quitting. Day 11:  Distract yourself. Stay active to keep your mind off smoking and to relieve tension. Take a walk, exercise, read a book, do a crossword puzzle, or try a new hobby. Day 12: Exercise. Get off the bus before your stop or use stairs instead of escalators. Day 13: Call on friends for support and encouragement. Day 14: Reward yourself! Two weeks without smoking! Day 15: Practice deep breathing exercises. Day 16: Bet a friend that you can stay a nonsmoker. Day 17: Ask to sit in nonsmoking sections of restaurants. Day 18: Hang up "No Smoking" signs. Day 19: Think of yourself as a nonsmoker. Day 20: Each morning, tell yourself you will not smoke. Day 21: Reward yourself! Three weeks without smoking! Day 22: Think of smoking in negative ways. Remember how it stains your teeth, gives you bad  breath, and leaves you short of breath. Day 23: Eat a nutritious breakfast. Day 24:Do not relive your days as a smoker. Day 25: Hold a pencil in your hand when talking on the telephone. Day 26: Tell all your friends you do not smoke. Day 27: Think about how much better food tastes. Day 28: Remember, one cigarette is one too many. Day 29: Take up a hobby that will keep your hands busy. Day 30: Congratulations! One month without smoking! Give yourself a big reward. Your caregiver can direct you to community resources or hospitals for support, which may include:  Group support.   Education.   Hypnosis.   Subliminal therapy.  Document Released: 10/18/2003 Document Revised: 01/08/2011 Document Reviewed: 11/05/2008 [facility] Patient Information 2012 [place], [place].

## 2011-07-03 NOTE — Progress Notes (Signed)
  Subjective:    Patient ID: Ian Salazar, male    DOB: April 20, 1960, 51 y.o.   MRN: [id number]  HPI  complains of various symptoms, especially intermittent vertigo, L ear pain and L pleurisy Onset 3 weeks ago  Also DM - checks cbgs each AM 80-106 - requests med refill  Past Medical History  Diagnosis Date  . Type II or unspecified type diabetes mellitus without mention of complication, not stated as uncontrolled   . Rotator cuff tendonitis     Left  . Abnormal EKG     Normal ETT 5 years ago  . Anemia     Review of Systems  Constitutional: Positive for fever and chills.  HENT: Positive for ear pain, congestion, sneezing and tinnitus.   Respiratory: Positive for cough. Negative for shortness of breath and wheezing.   Cardiovascular: Negative for chest pain and leg swelling.  Gastrointestinal: Positive for nausea and abdominal pain.  Neurological: Positive for dizziness and weakness. Negative for headaches.       Objective:   Physical Exam BP 112/62  Pulse 86  Temp(Src) 98.2 F (36.8 C) (Oral)  Ht 5' 6.5" (1.689 m)  Wt 208 lb 12.8 oz (94.711 kg)  BMI 33.20 kg/m2  SpO2 95% Wt Readings from Last 3 Encounters:  07/03/11 208 lb 12.8 oz (94.711 kg)  04/22/10 208 lb (94.348 kg)  04/18/10 209 lb (94.802 kg)   Constitutional:  He appears well-developed and well-nourished. No distress. Wife at side HENT: L TM red with hazy effusion - OP red with PND, no exudate Neck: Normal range of motion. Neck supple. No JVD present. No thyromegaly present.  Cardiovascular: Normal rate, regular rhythm and normal heart sounds.  No murmur heard. no BLE edema Pulmonary/Chest: Effort normal and breath sounds diminished on L side. No respiratory distress. no wheezes.  Abdominal: Soft. Bowel sounds are normal. Patient exhibits no distension. There is no tenderness.  Neurological: he is alert and oriented to person, place, and time. No cranial nerve deficit. Coordination normal.  Skin: Skin is  warm and dry.  No erythema or ulceration.  Psychiatric: he has a normal mood and affect. behavior is normal. Judgment and thought content normal.   Lab Results  Component Value Date   WBC 5.2 04/18/2010   HGB 13.4 04/18/2010   HCT 39.3 04/18/2010   PLT 152.0 04/18/2010   GLUCOSE 142* 04/18/2010   CHOL 158 04/18/2010   TRIG 159.0* 04/18/2010   HDL 55.80 04/18/2010   LDLCALC 70 04/18/2010   ALT 21 04/18/2010   AST 23 04/18/2010   NA 139 04/18/2010   K 3.8 04/18/2010   CL 106 04/18/2010   CREATININE 0.7 04/18/2010   BUN 17 04/18/2010   CO2 26 04/18/2010   TSH 1.82 04/18/2010   PSA 0.39 04/18/2010   HGBA1C 6.9* 04/18/2010       Assessment & Plan:  L OM - Augmentin - also meclizine for associated with vertigo symptoms - continue allergy sinus OTC meds  L pleurisy - check labs and CXR given duration >3 weeks and smoker status  DM2 - check a1c and refill metformin

## 2011-07-17 ENCOUNTER — Ambulatory Visit (INDEPENDENT_AMBULATORY_CARE_PROVIDER_SITE_OTHER)
Admission: RE | Admit: 2011-07-17 | Discharge: 2011-07-17 | Disposition: A | Discharge status: Home / Self Care | Payer: BC Managed Care – PPO | Source: Ambulatory Visit | Attending: Internal Medicine | Admitting: Internal Medicine

## 2011-07-17 ENCOUNTER — Encounter: Payer: Self-pay | Admitting: Internal Medicine

## 2011-07-17 ENCOUNTER — Ambulatory Visit (INDEPENDENT_AMBULATORY_CARE_PROVIDER_SITE_OTHER): Payer: BC Managed Care – PPO | Admitting: Internal Medicine

## 2011-07-17 ENCOUNTER — Other Ambulatory Visit (INDEPENDENT_AMBULATORY_CARE_PROVIDER_SITE_OTHER): Payer: BC Managed Care – PPO

## 2011-07-17 VITALS — BP 106/68 | HR 80 | Temp 97.7°F | Resp 20 | Wt 213.5 lb

## 2011-07-17 DIAGNOSIS — R9389 Abnormal findings on diagnostic imaging of other specified body structures: Secondary | ICD-10-CM

## 2011-07-17 DIAGNOSIS — E782 Mixed hyperlipidemia: Secondary | ICD-10-CM

## 2011-07-17 DIAGNOSIS — Z0000 Encounter for general adult medical examination without abnormal findings: Secondary | ICD-10-CM

## 2011-07-17 DIAGNOSIS — D649 Anemia, unspecified: Secondary | ICD-10-CM

## 2011-07-17 DIAGNOSIS — R918 Other nonspecific abnormal finding of lung field: Secondary | ICD-10-CM

## 2011-07-17 DIAGNOSIS — E538 Deficiency of other specified B group vitamins: Secondary | ICD-10-CM

## 2011-07-17 DIAGNOSIS — F17200 Nicotine dependence, unspecified, uncomplicated: Secondary | ICD-10-CM

## 2011-07-17 DIAGNOSIS — E119 Type 2 diabetes mellitus without complications: Secondary | ICD-10-CM

## 2011-07-17 LAB — URINALYSIS, ROUTINE W REFLEX MICROSCOPIC
Leukocytes, UA: NEGATIVE
Nitrite: NEGATIVE
Specific Gravity, Urine: 1.025 (ref 1.000–1.030)
Urine Glucose: NEGATIVE
Urobilinogen, UA: 0.2 (ref 0.0–1.0)

## 2011-07-17 LAB — LIPID PANEL
Cholesterol: 142 mg/dL (ref 0–200)
LDL Cholesterol: 71 mg/dL (ref 0–99)
Total CHOL/HDL Ratio: 3

## 2011-07-17 LAB — CBC WITH DIFFERENTIAL/PLATELET
Basophils Absolute: 0 10*3/uL (ref 0.0–0.1)
HCT: 37.7 % — ABNORMAL LOW (ref 39.0–52.0)
Lymphocytes Relative: 40.1 % (ref 12.0–46.0)
Monocytes Relative: 13.1 % — ABNORMAL HIGH (ref 3.0–12.0)
Neutro Abs: 1.5 10*3/uL (ref 1.4–7.7)
RDW: 13.9 % (ref 11.5–14.6)

## 2011-07-17 LAB — FERRITIN: Ferritin: 193.9 ng/mL (ref 22.0–322.0)

## 2011-07-17 LAB — PSA: PSA: 0.34 ng/mL (ref 0.10–4.00)

## 2011-07-17 LAB — TSH: TSH: 1.75 u[IU]/mL (ref 0.35–5.50)

## 2011-07-17 LAB — IBC PANEL: Iron: 67 ug/dL (ref 42–165)

## 2011-07-17 NOTE — Assessment & Plan Note (Signed)
FLP today 

## 2011-07-17 NOTE — Progress Notes (Signed)
Subjective:    Patient ID: Ian Salazar, male    DOB: [birth year], 51 y.o.   MRN: [id number]  Anemia Presents for follow-up visit. There has been no abdominal pain, anorexia, bruising/bleeding easily, confusion, fever, leg swelling, light-headedness, malaise/fatigue, pallor, palpitations, paresthesias, pica or weight loss. Signs of blood loss that are not present include hematemesis, hematochezia and melena. Compliance problems include psychosocial issues.       Review of Systems  Constitutional: Negative for fever, chills, weight loss, malaise/fatigue, diaphoresis, activity change, appetite change, fatigue and unexpected weight change.  HENT: Negative.   Eyes: Negative.   Respiratory: Negative for apnea, cough, choking, chest tightness, shortness of breath, wheezing and stridor.   Cardiovascular: Negative for chest pain, palpitations and leg swelling.  Gastrointestinal: Negative for nausea, vomiting, abdominal pain, diarrhea, constipation, blood in stool, melena, hematochezia, abdominal distention, anal bleeding, anorexia and hematemesis.  Genitourinary: Negative for dysuria, urgency, frequency, hematuria, flank pain, decreased urine volume, discharge, penile swelling, scrotal swelling, enuresis, difficulty urinating, genital sores, penile pain and testicular pain.  Musculoskeletal: Negative for myalgias, back pain, joint swelling, arthralgias and gait problem.  Skin: Negative for color change, pallor, rash and wound.  Neurological: Negative for dizziness, tremors, seizures, syncope, facial asymmetry, speech difficulty, weakness, light-headedness, numbness, headaches and paresthesias.  Hematological: Negative for adenopathy. Does not bruise/bleed easily.  Psychiatric/Behavioral: Negative.  Negative for confusion.       Objective:   Physical Exam  Vitals reviewed. Constitutional: He is oriented to person, place, and time. He appears well-developed and well-nourished. No distress.    HENT:  Head: Normocephalic and atraumatic. No trismus in the jaw.  Right Ear: Hearing, tympanic membrane, external ear and ear canal normal.  Left Ear: Hearing, external ear and ear canal normal.  Mouth/Throat: Oropharynx is clear and moist and mucous membranes are normal. Mucous membranes are not pale, not dry and not cyanotic. No uvula swelling. No oropharyngeal exudate, posterior oropharyngeal edema, posterior oropharyngeal erythema or tonsillar abscesses.  Eyes: Conjunctivae are normal. Right eye exhibits no discharge. Left eye exhibits no discharge. No scleral icterus.  Neck: Normal range of motion. Neck supple. No JVD present. No tracheal deviation present. No thyromegaly present.  Cardiovascular: Normal rate, regular rhythm, normal heart sounds and intact distal pulses.  Exam reveals no gallop and no friction rub.   No murmur heard. Pulmonary/Chest: Effort normal and breath sounds normal. No stridor. No respiratory distress. He has no wheezes. He has no rales. He exhibits no tenderness.  Abdominal: Soft. Bowel sounds are normal. He exhibits no distension and no mass. There is no tenderness. There is no rebound and no guarding. Hernia confirmed negative in the right inguinal area and confirmed negative in the left inguinal area.  Genitourinary: Rectum normal, testes normal and penis normal. Rectal exam shows no external hemorrhoid, no internal hemorrhoid, no fissure, no mass and anal tone normal. Guaiac negative stool. Prostate is enlarged (1+ smooth symmetrical BPH). Prostate is not tender. Right testis shows no mass, no swelling and no tenderness. Right testis is descended. Left testis shows no mass, no swelling and no tenderness. Left testis is descended. Circumcised. No penile tenderness. No discharge found.  Musculoskeletal: Normal range of motion. He exhibits no edema and no tenderness.  Lymphadenopathy:    He has no cervical adenopathy.       Right: No inguinal adenopathy present.        Left: No inguinal adenopathy present.  Neurological: He is oriented to person, place, and time.  Skin:  Skin is warm and dry. No rash noted. He is not diaphoretic. No erythema. No pallor.  Psychiatric: He has a normal mood and affect. His behavior is normal. Judgment and thought content normal.      Lab Results  Component Value Date   WBC 7.1 07/03/2011   HGB 12.3* 07/03/2011   HCT 37.6* 07/03/2011   PLT 162.0 07/03/2011   GLUCOSE 120* 07/03/2011   CHOL 158 04/18/2010   TRIG 159.0* 04/18/2010   HDL 55.80 04/18/2010   LDLCALC 70 04/18/2010   ALT 46 07/03/2011   AST 29 07/03/2011   NA 139 07/03/2011   K 4.0 07/03/2011   CL 103 07/03/2011   CREATININE 0.8 07/03/2011   BUN 11 07/03/2011   CO2 27 07/03/2011   TSH 1.82 04/18/2010   PSA 0.39 04/18/2010   HGBA1C 7.1* 07/03/2011  Dg Chest 2 View  07/03/2011  *RADIOLOGY REPORT*  Clinical Data: Pleurisy.  Cough and smoker.  CHEST - 2 VIEW  Comparison: None  Findings: The heart size appears normal.  No pleural effusion or edema.  There is atelectasis and airspace consolidation involving the lingular portion of the left lung.  The right lung appears clear.  The mediastinal and hilar contours appear normal.  Biapical pleural thickening is noted, right greater than left.  IMPRESSION:  1.  Consolidation and atelectasis of the lingular portion of the left lung. This may be due to pneumonia.  Underlying obstructing mass lesion cannot be excluded and follow-up imaging is advised to ensure resolution.  These results will be called to the ordering clinician or representative by the Radiologist Assistant, and communication documented in the PACS Dashboard.  Original Report Authenticated By: Rosealee Albee, M.D.     Assessment & Plan:

## 2011-07-17 NOTE — Assessment & Plan Note (Signed)
His recent a1c looks good, he needs an eye exam

## 2011-07-17 NOTE — Assessment & Plan Note (Signed)
Recheck his CBC and check his B12 level today

## 2011-07-17 NOTE — Assessment & Plan Note (Signed)
He is cutting back but not ready to quit, he does not want any assistance from me

## 2011-07-17 NOTE — Assessment & Plan Note (Signed)
He has no s/s today. I will recheck his CXR and refer him for a CT

## 2011-07-17 NOTE — Assessment & Plan Note (Signed)
Exam done, labs ordered, vaccines were addressed, pt ed material was given 

## 2011-07-17 NOTE — Assessment & Plan Note (Signed)
He has no s/s of blood loss, I will recheck his CBC today and will check vit levels as well

## 2011-07-17 NOTE — Patient Instructions (Signed)
Anemia, Nonspecific Your exam and blood tests show you are anemic. This means your blood (hemoglobin) level is low. Normal hemoglobin values are 12 to 15 g/dL for females and 14 to 17 g/dL for males. Make a note of your hemoglobin level today. The hematocrit percent is also used to measure anemia. A normal hematocrit is 38% to 46% in females and 42% to 49% in males. Make a note of your hematocrit level today. CAUSES  Anemia can be due to many different causes.  Excessive bleeding from periods (in women).   Intestinal bleeding.   Poor nutrition.   Kidney, thyroid, liver, and bone marrow diseases.  SYMPTOMS  Anemia can come on suddenly (acute). It can also come on slowly. Symptoms can include:  Minor weakness.   Dizziness.   Palpitations.   Shortness of breath.  Symptoms may be absent until half your hemoglobin is missing if it comes on slowly. Anemia due to acute blood loss from an injury or internal bleeding may require blood transfusion if the loss is severe. Hospital care is needed if you are anemic and there is significant continual blood loss. TREATMENT   Stool tests for blood (Hemoccult) and additional lab tests are often needed. This determines the best treatment.   Further checking on your condition and your response to treatment is very important. It often takes many weeks to correct anemia.  Depending on the cause, treatment can include:  Supplements of iron.   Vitamins B12 and folic acid.   Hormone medicines.If your anemia is due to bleeding, finding the cause of the blood loss is very important. This will help avoid further problems.  SEEK IMMEDIATE MEDICAL CARE IF:   You develop fainting, extreme weakness, shortness of breath, or chest pain.   You develop heavy vaginal bleeding.   You develop bloody or black, tarry stools or vomit up blood.   You develop a high fever, rash, repeated vomiting, or dehydration.  Document Released: 02/27/2004 Document Revised:  01/08/2011 Document Reviewed: 12/04/2008 [facility] Patient Information 2012 [place], [place].Health Maintenance, Males A healthy lifestyle and preventative care can promote health and wellness.  Maintain regular health, dental, and eye exams.   Eat a healthy diet. Foods like vegetables, fruits, whole grains, low-fat dairy products, and lean protein foods contain the nutrients you need without too many calories. Decrease your intake of foods high in solid fats, added sugars, and salt. Get information about a proper diet from your caregiver, if necessary.   Regular physical exercise is one of the most important things you can do for your health. Most adults should get at least 150 minutes of moderate-intensity exercise (any activity that increases your heart rate and causes you to sweat) each week. In addition, most adults need muscle-strengthening exercises on 2 or more days a week.    Maintain a healthy weight. The body mass index (BMI) is a screening tool to identify possible weight problems. It provides an estimate of body fat based on height and weight. Your caregiver can help determine your BMI, and can help you achieve or maintain a healthy weight. For adults 20 years and older:   A BMI below 18.5 is considered underweight.   A BMI of 18.5 to 24.9 is normal.   A BMI of 25 to 29.9 is considered overweight.   A BMI of 30 and above is considered obese.   Maintain normal blood lipids and cholesterol by exercising and minimizing your intake of saturated fat. Eat a balanced diet with plenty  of fruits and vegetables. Blood tests for lipids and cholesterol should begin at age 24 and be repeated every 5 years. If your lipid or cholesterol levels are high, you are over 50, or you are a high risk for heart disease, you may need your cholesterol levels checked more frequently.Ongoing high lipid and cholesterol levels should be treated with medicines, if diet and exercise are not effective.   If you  smoke, find out from your caregiver how to quit. If you do not use tobacco, do not start.   If you choose to drink alcohol, do not exceed 2 drinks per day. One drink is considered to be 12 ounces (355 mL) of beer, 5 ounces (148 mL) of wine, or 1.5 ounces (44 mL) of liquor.   Avoid use of street drugs. Do not share needles with anyone. Ask for help if you need support or instructions about stopping the use of drugs.   High blood pressure causes heart disease and increases the risk of stroke. Blood pressure should be checked at least every 1 to 2 years. Ongoing high blood pressure should be treated with medicines if weight loss and exercise are not effective.   If you are 81 to 51 years old, ask your caregiver if you should take aspirin to prevent heart disease.   Diabetes screening involves taking a blood sample to check your fasting blood sugar level. This should be done once every 3 years, after age 83, if you are within normal weight and without risk factors for diabetes. Testing should be considered at a younger age or be carried out more frequently if you are overweight and have at least 1 risk factor for diabetes.   Colorectal cancer can be detected and often prevented. Most routine colorectal cancer screening begins at the age of 44 and continues through age 49. However, your caregiver may recommend screening at an earlier age if you have risk factors for colon cancer. On a yearly basis, your caregiver may provide home test kits to check for hidden blood in the stool. Use of a small camera at the end of a tube, to directly examine the colon (sigmoidoscopy or colonoscopy), can detect the earliest forms of colorectal cancer. Talk to your caregiver about this at age 54, when routine screening begins. Direct examination of the colon should be repeated every 5 to 10 years through age 76, unless early forms of pre-cancerous polyps or small growths are found.   Hepatitis C blood testing is recommended  for all people born from [birth year] through 1965 and any individual with known risks for hepatitis C.   Healthy men should no longer receive prostate-specific antigen (PSA) blood tests as part of routine cancer screening. Consult with your caregiver about prostate cancer screening.   Testicular cancer screening is not recommended for adolescents or adult males who have no symptoms. Screening includes self-exam, caregiver exam, and other screening tests. Consult with your caregiver about any symptoms you have or any concerns you have about testicular cancer.   Practice safe sex. Use condoms and avoid high-risk sexual practices to reduce the spread of sexually transmitted infections (STIs).   Use sunscreen with a sun protection factor (SPF) of 30 or greater. Apply sunscreen liberally and repeatedly throughout the day. You should seek shade when your shadow is shorter than you. Protect yourself by wearing long sleeves, pants, a wide-brimmed hat, and sunglasses year round, whenever you are outdoors.   Notify your caregiver of new moles or changes in moles,  especially if there is a change in shape or color. Also notify your caregiver if a mole is larger than the size of a pencil eraser.   A one-time screening for abdominal aortic aneurysm (AAA) and surgical repair of large AAAs by sound wave imaging (ultrasonography) is recommended for ages 17 to 44 years who are current or former smokers.   Stay current with your immunizations.  Document Released: 07/18/2007 Document Revised: 01/08/2011 Document Reviewed: 06/16/2010 [facility] Patient Information 2012 [place], [place].

## 2011-08-13 ENCOUNTER — Ambulatory Visit (AMBULATORY_SURGERY_CENTER): Payer: BC Managed Care – PPO

## 2011-08-13 ENCOUNTER — Encounter: Payer: Self-pay | Admitting: Internal Medicine

## 2011-08-13 VITALS — Ht 69.5 in | Wt 216.8 lb

## 2011-08-13 DIAGNOSIS — Z1211 Encounter for screening for malignant neoplasm of colon: Secondary | ICD-10-CM

## 2011-08-13 MED ORDER — MOVIPREP 100 G PO SOLR: ORAL | Status: DC

## 2011-08-27 ENCOUNTER — Encounter: Payer: BC Managed Care – PPO | Admitting: Internal Medicine

## 2011-10-02 ENCOUNTER — Ambulatory Visit: Payer: BC Managed Care – PPO | Admitting: Internal Medicine

## 2011-12-22 ENCOUNTER — Other Ambulatory Visit: Payer: Self-pay | Admitting: Internal Medicine

## 2015-01-14 ENCOUNTER — Ambulatory Visit (INDEPENDENT_AMBULATORY_CARE_PROVIDER_SITE_OTHER): Payer: BLUE CROSS/BLUE SHIELD | Admitting: Urgent Care

## 2015-01-14 ENCOUNTER — Other Ambulatory Visit: Payer: Self-pay | Admitting: *Deleted

## 2015-01-14 ENCOUNTER — Encounter: Payer: Self-pay | Admitting: Urgent Care

## 2015-01-14 VITALS — BP 122/76 | HR 100 | Temp 98.6°F | Resp 16 | Ht 68.0 in | Wt 227.2 lb

## 2015-01-14 DIAGNOSIS — Z0000 Encounter for general adult medical examination without abnormal findings: Secondary | ICD-10-CM

## 2015-01-14 DIAGNOSIS — Z1211 Encounter for screening for malignant neoplasm of colon: Secondary | ICD-10-CM | POA: Diagnosis not present

## 2015-01-14 DIAGNOSIS — R3589 Other polyuria: Secondary | ICD-10-CM

## 2015-01-14 DIAGNOSIS — K625 Hemorrhage of anus and rectum: Secondary | ICD-10-CM | POA: Diagnosis not present

## 2015-01-14 DIAGNOSIS — E1165 Type 2 diabetes mellitus with hyperglycemia: Secondary | ICD-10-CM

## 2015-01-14 DIAGNOSIS — M62830 Muscle spasm of back: Secondary | ICD-10-CM | POA: Diagnosis not present

## 2015-01-14 DIAGNOSIS — R358 Other polyuria: Secondary | ICD-10-CM

## 2015-01-14 DIAGNOSIS — F17200 Nicotine dependence, unspecified, uncomplicated: Secondary | ICD-10-CM | POA: Diagnosis not present

## 2015-01-14 DIAGNOSIS — IMO0001 Reserved for inherently not codable concepts without codable children: Secondary | ICD-10-CM

## 2015-01-14 LAB — POCT URINALYSIS DIP (MANUAL ENTRY)
Bilirubin, UA: NEGATIVE
Ketones, POC UA: NEGATIVE
Leukocytes, UA: NEGATIVE
NITRITE UA: NEGATIVE
PROTEIN UA: NEGATIVE
SPEC GRAV UA: 1.025
UROBILINOGEN UA: 0.2
pH, UA: 5

## 2015-01-14 LAB — COMPREHENSIVE METABOLIC PANEL
ALK PHOS: 56 U/L (ref 40–115)
ALT: 18 U/L (ref 9–46)
AST: 15 U/L (ref 10–35)
Albumin: 4 g/dL (ref 3.6–5.1)
BILIRUBIN TOTAL: 0.4 mg/dL (ref 0.2–1.2)
BUN: 9 mg/dL (ref 7–25)
CALCIUM: 9.4 mg/dL (ref 8.6–10.3)
CO2: 27 mmol/L (ref 20–31)
Chloride: 104 mmol/L (ref 98–110)
Creat: 0.73 mg/dL (ref 0.70–1.33)
Glucose, Bld: 171 mg/dL — ABNORMAL HIGH (ref 65–99)
POTASSIUM: 4 mmol/L (ref 3.5–5.3)
Sodium: 139 mmol/L (ref 135–146)
TOTAL PROTEIN: 6.7 g/dL (ref 6.1–8.1)

## 2015-01-14 LAB — LIPID PANEL
CHOL/HDL RATIO: 2.3 ratio (ref <=5.0)
CHOLESTEROL: 138 mg/dL (ref 125–200)
HDL: 59 mg/dL (ref >=40)
LDL Cholesterol: 71 mg/dL (ref <130)
Triglycerides: 41 mg/dL (ref <150)
VLDL: 8 mg/dL (ref <30)

## 2015-01-14 LAB — CBC
HEMATOCRIT: 42 % (ref 39.0–52.0)
HEMOGLOBIN: 13.8 g/dL (ref 13.0–17.0)
MCH: 27.8 pg (ref 26.0–34.0)
MCHC: 32.9 g/dL (ref 30.0–36.0)
MCV: 84.7 fL (ref 78.0–100.0)
MPV: 11.1 fL (ref 8.6–12.4)
Platelets: 199 10*3/uL (ref 150–400)
RBC: 4.96 MIL/uL (ref 4.22–5.81)
RDW: 13.2 % (ref 11.5–15.5)
WBC: 5 10*3/uL (ref 4.0–10.5)

## 2015-01-14 LAB — POCT GLYCOSYLATED HEMOGLOBIN (HGB A1C): Hemoglobin A1C: 9.4

## 2015-01-14 MED ORDER — LISINOPRIL 5 MG PO TABS: 5.0000 mg | ORAL_TABLET | Freq: Every day | ORAL | Status: DC

## 2015-01-14 MED ORDER — BLOOD GLUCOSE MONITOR KIT: PACK | Status: DC

## 2015-01-14 MED ORDER — METFORMIN HCL 1000 MG PO TABS: 1000.0000 mg | ORAL_TABLET | Freq: Two times a day (BID) | ORAL | Status: DC

## 2015-01-14 MED ORDER — CYCLOBENZAPRINE HCL 10 MG PO TABS: 5.0000 mg | ORAL_TABLET | Freq: Every day | ORAL | Status: DC

## 2015-01-14 MED ORDER — GLUCOSE BLOOD VI STRP: ORAL_STRIP | Status: DC

## 2015-01-14 MED ORDER — ACCU-CHEK SOFTCLIX LANCETS MISC: Status: DC

## 2015-01-14 NOTE — Progress Notes (Signed)
MRN: [id number]  Subjective:   [name] is a 54 y.o. male presenting for annual physical exam and back pain, diabetes.  Medical care team includes: PCP: None. Vision: Last eye exam was in 2015. Dental: Seeing dentist for cleanings and plans on having a tooth extraction. Specialists: None.  Patient is married, works at a [employer]. Smokes 1/2 pack per day, has ~20 year pack history. Drinks ~2 alcoholic drinks per week. Diet is mixed, eats a lot of rice, drinks some sodas.  Back pain - reports intermittent pain over his upper back. Pain is like a tightness in nature, worse with work and increased activity. Has not tried medications for relief. Denies trauma, fever, weight loss, night sweats.  Diabetes - ran out of Metformin and has not had follow up for this in >2 years. Admits unhealthy diet and no exercise. Admits polyuria, polyphagia. Denies foot infections, skin infections. ROS as below.  Ian Salazar has a current medication list which includes the following prescription(s): glucose blood and metformin. He has No Known Allergies.  Ian Salazar  has a past medical history of Type II or unspecified type diabetes mellitus without mention of complication, not stated as uncontrolled; Rotator cuff tendonitis; Abnormal EKG; Anemia; and Knee pain, right. Also  has past surgical history that includes Multiple tooth extractions.  His family history includes Diabetes in his maternal grandmother, other, and sister; Heart disease in his sister. There is no history of Alcohol abuse, Cancer, Stroke, Hypertension, or Hyperlipidemia.  Immunizations: Flu 2016, last TDAP 2010  Review of Systems  Constitutional: Negative for fever, chills, weight loss, malaise/fatigue and diaphoresis.  HENT: Negative for congestion, ear discharge, ear pain, hearing loss, nosebleeds, sore throat and tinnitus.   Eyes: Negative for blurred vision, double vision, photophobia, pain, discharge and redness.  Respiratory:  Positive for wheezing. Negative for cough and shortness of breath.   Cardiovascular: Negative for chest pain, palpitations and leg swelling.  Gastrointestinal: Positive for blood in stool (intermittent, sometimes on toilet paper). Negative for nausea, vomiting, abdominal pain, diarrhea and constipation.  Genitourinary: Positive for frequency. Negative for dysuria, urgency, hematuria and flank pain.  Musculoskeletal: Positive for back pain. Negative for falls and neck pain.  Skin: Negative for itching and rash.  Neurological: Negative for dizziness, tingling, seizures, loss of consciousness, weakness and headaches.  Endo/Heme/Allergies: Positive for polydipsia.  Psychiatric/Behavioral: Negative for depression, suicidal ideas, hallucinations, memory loss and substance abuse. The patient is not nervous/anxious and does not have insomnia.    Objective:   Vitals: BP 122/76 mmHg  Pulse 100  Temp(Src) 98.6 F (37 C) (Oral)  Resp 16  Ht 5\' 8"  (1.727 m)  Wt 227 lb 3.2 oz (103.057 kg)  BMI 34.55 kg/m2  SpO2 98%  Physical Exam  Constitutional: He is oriented to person, place, and time. He appears well-developed and well-nourished.  HENT:  TM's intact bilaterally, no effusions or erythema. Nasal turbinates pink and moist, nasal passages patent. No sinus tenderness. Oropharynx clear, mucous membranes moist, upper dentures otherwise dentition in good repair.  Eyes: Conjunctivae and EOM are normal. Pupils are equal, round, and reactive to light. Right eye exhibits no discharge. Left eye exhibits no discharge. No scleral icterus.  Neck: Normal range of motion. Neck supple. No thyromegaly present.  Cardiovascular: Normal rate, regular rhythm and intact distal pulses.  Exam reveals no gallop and no friction rub.   No murmur heard. Pulmonary/Chest: No stridor. No respiratory distress. He has no wheezes. He has no rales.  Abdominal: Soft. Bowel sounds are normal. He exhibits no distension and no mass.  There is no tenderness.  Genitourinary: Rectal exam shows no external hemorrhoid, no fissure, no mass and no tenderness.  Musculoskeletal: Normal range of motion. He exhibits no edema.       Cervical back: He exhibits tenderness (over spasm) and spasm (significant and tender spasm over area depicted). He exhibits normal range of motion, no bony tenderness, no swelling, no edema, no deformity and no laceration.       Thoracic back: He exhibits normal range of motion, no tenderness, no bony tenderness, no swelling, no edema, no deformity, no laceration and no spasm.       Back:  Strength 5/5 throughout.  Lymphadenopathy:    He has no cervical adenopathy.  Neurological: He is alert and oriented to person, place, and time. He has normal reflexes. Coordination normal.  Skin: Skin is warm and dry. No rash noted. No erythema. No pallor.  Psychiatric: He has a normal mood and affect.   Results for orders placed or performed in visit on 01/14/15 (from the past 24 hour(s))  POCT glycosylated hemoglobin (Hb A1C)     Status: Abnormal   Collection Time: 01/14/15  2:32 PM  Result Value Ref Range   Hemoglobin A1C 9.4   POCT urinalysis dipstick     Status: Abnormal   Collection Time: 01/14/15  2:33 PM  Result Value Ref Range   Color, UA yellow yellow   Clarity, UA clear clear   Glucose, UA >=1,000 (A) negative   Bilirubin, UA negative negative   Ketones, POC UA negative negative   Spec Grav, UA 1.025    Blood, UA trace-intact (A) negative   pH, UA 5.0    Protein Ur, POC negative negative   Urobilinogen, UA 0.2    Nitrite, UA Negative Negative   Leukocytes, UA Negative Negative   Assessment and Plan :   1. Annual physical exam - Labs pending, patient is medically stable - Discussed healthy lifestyle, diet, exercise, preventative care, vaccinations, and addressed patient's concerns.   2. Uncontrolled type 2 diabetes mellitus without complication, without long-term current use of insulin  ([place]) 3. Polyuria - Restart Metformin, titrate up to 1000mg  BID. Patient is to start checking fasting blood sugar.  - Counseled on dietary modifications and starting exercise. - Follow up in 4 weeks.  4. Muscle spasm of back - Recommended stretching, hydration, Flexeril. Counseled on good posture. RTC if no improvement in 2 weeks.  5. Screen for colon cancer 6. Anal bleeding - Ambulatory referral to Gastroenterology  7. Tobacco use disorder - Patient will consider medical therapy, he has previously been able to cut down to smoking 1 cigarette but was not able to quit entirely. Will consider starting Chantix or Wellbutrin with patient for smoking cessation.  Jaynee Eagles, PA-C [facility] [phone] 01/14/2015  1:00 PM

## 2015-01-14 NOTE — Patient Instructions (Signed)
Keeping you healthy  Get these tests  Blood pressure- Have your blood pressure checked once a year by your healthcare provider.  Normal blood pressure is 120/80  Weight- Have your body mass index (BMI) calculated to screen for obesity.  BMI is a measure of body fat based on height and weight. You can also calculate your own BMI at [email or web address].  Cholesterol- Have your cholesterol checked every year.  Diabetes- Have your blood sugar checked regularly if you have high blood pressure, high cholesterol, have a family history of diabetes or if you are overweight.  Screening for Colon Cancer- Colonoscopy starting at age 32.  Screening may begin sooner depending on your family history and other health conditions. Follow up colonoscopy as directed by your Gastroenterologist.  Screening for Prostate Cancer- Both blood work (PSA) and a rectal exam help screen for Prostate Cancer.  Screening begins at age 56 with African-American men and at age 99 with Caucasian men.  Screening may begin sooner depending on your family history.  Take these medicines  Aspirin- One aspirin daily can help prevent Heart disease and Stroke.  Flu shot- Every fall.  Tetanus- Every 10 years.  Zostavax- Once after the age of 18 to prevent Shingles.  Pneumonia shot- Once after the age of 46; if you are younger than 103, ask your healthcare provider if you need a Pneumonia shot.  Take these steps  Don't smoke- If you do smoke, talk to your doctor about quitting.  For tips on how to quit, go to www.smokefree.gov or call 1-800-QUIT-NOW.  Be physically active- Exercise 5 days a week for at least 30 minutes.  If you are not already physically active start slow and gradually work up to 30 minutes of moderate physical activity.  Examples of moderate activity include walking briskly, mowing the yard, dancing, swimming, bicycling, etc.  Eat a healthy diet- Eat a variety of healthy food such as fruits, vegetables, low  fat milk, low fat cheese, yogurt, lean meant, poultry, fish, beans, tofu, etc. For more information go to www.thenutritionsource.org  Drink alcohol in moderation- Limit alcohol intake to less than two drinks a day. Never drink and drive.  Dentist- Brush and floss twice daily; visit your dentist twice a year.  Depression- Your emotional health is as important as your physical health. If you're feeling down, or losing interest in things you would normally enjoy please talk to your healthcare provider.  Eye exam- Visit your eye doctor every year.  Safe sex- If you may be exposed to a sexually transmitted infection, use a condom.  Seat belts- Seat belts can save your life; always wear one.  Smoke/Carbon Monoxide detectors- These detectors need to be installed on the appropriate level of your home.  Replace batteries at least once a year.  Skin cancer- When out in the sun, cover up and use sunscreen 15 SPF or higher.  Violence- If anyone is threatening you, please tell your healthcare provider.  Living Will/ Health care power of attorney- Speak with your healthcare provider and family.   Diabetes Mellitus and Food It is important for you to manage your blood sugar (glucose) level. Your blood glucose level can be greatly affected by what you eat. Eating healthier foods in the appropriate amounts throughout the day at about the same time each day will help you control your blood glucose level. It can also help slow or prevent worsening of your diabetes mellitus. Healthy eating may even help you improve the level of your blood  pressure and reach or maintain a healthy weight.  General recommendations for healthful eating and cooking habits include:  Eating meals and snacks regularly. Avoid going long periods of time without eating to lose weight.  Eating a diet that consists mainly of plant-based foods, such as fruits, vegetables, nuts, legumes, and whole grains.  Using low-heat cooking  methods, such as baking, instead of high-heat cooking methods, such as deep frying. Work with your dietitian to make sure you understand how to use the Nutrition Facts information on food labels. HOW CAN FOOD AFFECT ME? Carbohydrates Carbohydrates affect your blood glucose level more than any other type of food. Your dietitian will help you determine how many carbohydrates to eat at each meal and teach you how to count carbohydrates. Counting carbohydrates is important to keep your blood glucose at a healthy level, especially if you are using insulin or taking certain medicines for diabetes mellitus. Alcohol Alcohol can cause sudden decreases in blood glucose (hypoglycemia), especially if you use insulin or take certain medicines for diabetes mellitus. Hypoglycemia can be a life-threatening condition. Symptoms of hypoglycemia (sleepiness, dizziness, and disorientation) are similar to symptoms of having too much alcohol.  If your health care provider has given you approval to drink alcohol, do so in moderation and use the following guidelines:  Women should not have more than one drink per day, and men should not have more than two drinks per day. One drink is equal to:  12 oz of beer.  5 oz of wine.  1 oz of hard liquor.  Do not drink on an empty stomach.  Keep yourself hydrated. Have water, diet soda, or unsweetened iced tea.  Regular soda, juice, and other mixers might contain a lot of carbohydrates and should be counted. WHAT FOODS ARE NOT RECOMMENDED? As you make food choices, it is important to remember that all foods are not the same. Some foods have fewer nutrients per serving than other foods, even though they might have the same number of calories or carbohydrates. It is difficult to get your body what it needs when you eat foods with fewer nutrients. Examples of foods that you should avoid that are high in calories and carbohydrates but low in nutrients include:  Trans fats (most  processed foods list trans fats on the Nutrition Facts label).  Regular soda.  Juice.  Candy.  Sweets, such as cake, pie, doughnuts, and cookies.  Fried foods. WHAT FOODS CAN I EAT? Eat nutrient-rich foods, which will nourish your body and keep you healthy. The food you should eat also will depend on several factors, including:  The calories you need.  The medicines you take.  Your weight.  Your blood glucose level.  Your blood pressure level.  Your cholesterol level. You should eat a variety of foods, including:  Protein.  Lean cuts of meat.  Proteins low in saturated fats, such as fish, egg whites, and beans. Avoid processed meats.  Fruits and vegetables.  Fruits and vegetables that may help control blood glucose levels, such as apples, mangoes, and yams.  Dairy products.  Choose fat-free or low-fat dairy products, such as milk, yogurt, and cheese.  Grains, bread, pasta, and rice.  Choose whole grain products, such as multigrain bread, whole oats, and brown rice. These foods may help control blood pressure.  Fats.  Foods containing healthful fats, such as nuts, avocado, olive oil, canola oil, and fish. DOES EVERYONE WITH DIABETES MELLITUS HAVE THE SAME MEAL PLAN? Because every person with diabetes  mellitus is different, there is not one meal plan that works for everyone. It is very important that you meet with a dietitian who will help you create a meal plan that is just right for you.   This information is not intended to replace advice given to you by your health care provider. Make sure you discuss any questions you have with your health care provider.   Document Released: 10/16/2004 Document Revised: 02/09/2014 Document Reviewed: 12/16/2012 Elsevier Interactive Patient Education 2016 Else[employer]   Muscle Cramps and Spasms Muscle cramps and spasms occur when a muscle or muscles tighten and you have no control over this tightening (involuntary muscle  contraction). They are a common problem and can develop in any muscle. The most common place is in the calf muscles of the leg. Both muscle cramps and muscle spasms are involuntary muscle contractions, but they also have differences:   Muscle cramps are sporadic and painful. They may last a few seconds to a quarter of an hour. Muscle cramps are often more forceful and last longer than muscle spasms.  Muscle spasms may or may not be painful. They may also last just a few seconds or much longer. CAUSES  It is uncommon for cramps or spasms to be due to a serious underlying problem. In many cases, the cause of cramps or spasms is unknown. Some common causes are:   Overexertion.   Overuse from repetitive motions (doing the same thing over and over).   Remaining in a certain position for a long period of time.   Improper preparation, form, or technique while performing a sport or activity.   Dehydration.   Injury.   Side effects of some medicines.   Abnormally low levels of the salts and ions in your blood (electrolytes), especially potassium and calcium. This could happen if you are taking water pills (diuretics) or you are pregnant.  Some underlying medical problems can make it more likely to develop cramps or spasms. These include, but are not limited to:   Diabetes.   Parkinson disease.   Hormone disorders, such as thyroid problems.   Alcohol abuse.   Diseases specific to muscles, joints, and bones.   Blood vessel disease where not enough blood is getting to the muscles.  HOME CARE INSTRUCTIONS   Stay well hydrated. Drink enough water and fluids to keep your urine clear or pale yellow.  It may be helpful to massage, stretch, and relax the affected muscle.  For tight or tense muscles, use a warm towel, heating pad, or hot shower water directed to the affected area.  If you are sore or have pain after a cramp or spasm, applying ice to the affected area may  relieve discomfort.  Put ice in a plastic bag.  Place a towel between your skin and the bag.  Leave the ice on for 15-20 minutes, 03-04 times a day.  Medicines used to treat a known cause of cramps or spasms may help reduce their frequency or severity. Only take over-the-counter or prescription medicines as directed by your caregiver. SEEK MEDICAL CARE IF:  Your cramps or spasms get more severe, more frequent, or do not improve over time.  MAKE SURE YOU:   Understand these instructions.  Will watch your condition.  Will get help right away if you are not doing well or get worse.   This information is not intended to replace advice given to you by your health care provider. Make sure you discuss any questions you  have with your health care provider.   Document Released: 07/11/2001 Document Revised: 05/16/2012 Document Reviewed: 01/06/2012 Elsevier Interactive Patient Education [employer].

## 2015-01-14 NOTE — Telephone Encounter (Signed)
Pt called and needed rx for strips and lancets.  rx sent in.

## 2015-01-15 LAB — TSH: TSH: 0.786 u[IU]/mL (ref 0.350–4.500)

## 2015-01-17 ENCOUNTER — Encounter: Payer: Self-pay | Admitting: Urgent Care

## 2015-01-17 ENCOUNTER — Encounter: Payer: Self-pay | Admitting: Internal Medicine

## 2015-02-14 ENCOUNTER — Encounter: Payer: Self-pay | Admitting: Urgent Care

## 2015-02-14 ENCOUNTER — Ambulatory Visit (INDEPENDENT_AMBULATORY_CARE_PROVIDER_SITE_OTHER): Payer: BLUE CROSS/BLUE SHIELD | Admitting: Urgent Care

## 2015-02-14 VITALS — BP 114/71 | HR 106 | Temp 98.5°F | Resp 16 | Ht 69.0 in | Wt 226.2 lb

## 2015-02-14 DIAGNOSIS — F17200 Nicotine dependence, unspecified, uncomplicated: Secondary | ICD-10-CM

## 2015-02-14 DIAGNOSIS — E1165 Type 2 diabetes mellitus with hyperglycemia: Secondary | ICD-10-CM | POA: Diagnosis not present

## 2015-02-14 DIAGNOSIS — M62830 Muscle spasm of back: Secondary | ICD-10-CM | POA: Diagnosis not present

## 2015-02-14 DIAGNOSIS — IMO0001 Reserved for inherently not codable concepts without codable children: Secondary | ICD-10-CM

## 2015-02-14 MED ORDER — VARENICLINE TARTRATE 0.5 MG PO TABS: ORAL_TABLET | ORAL | Status: DC

## 2015-02-14 MED ORDER — VARENICLINE TARTRATE 1 MG PO TABS: 1.0000 mg | ORAL_TABLET | Freq: Two times a day (BID) | ORAL | Status: DC

## 2015-02-14 NOTE — Patient Instructions (Addendum)
Varenicline oral tablets What is this medicine? VARENICLINE (var EN i kleen) is used to help people quit smoking. It can reduce the symptoms caused by stopping smoking. It is used with a patient support program recommended by your physician. This medicine may be used for other purposes; ask your health care provider or pharmacist if you have questions. What should I tell my health care provider before I take this medicine? They need to know if you have any of these conditions: -bipolar disorder, depression, schizophrenia or other mental illness -heart disease -if you often drink alcohol -kidney disease -peripheral vascular disease -seizures -stroke -suicidal thoughts, plans, or attempt; a previous suicide attempt by you or a family member -an unusual or allergic reaction to varenicline, other medicines, foods, dyes, or preservatives -pregnant or trying to get pregnant -breast-feeding How should I use this medicine? Take this medicine by mouth after eating. Take with a full glass of water. Follow the directions on the prescription label. Take your doses at regular intervals. Do not take your medicine more often than directed. There are 3 ways you can use this medicine to help you quit smoking; talk to your health care professional to decide which plan is right for you: 1) you can choose a quit date and start this medicine 1 week before the quit date, or, 2) you can start taking this medicine before you choose a quit date, and then pick a quit date between day 8 and 35 days of treatment, or, 3) if you are not sure that you are able or willing to quit smoking right away, start taking this medicine and slowly decrease the amount you smoke as directed by your health care professional with the goal of being cigarette-free by week 12 of treatment. Stick to your plan; ask about support groups or other ways to help you remain cigarette-free. If you are motivated to quit smoking and did not succeed  during a previous attempt with this medicine for reasons other than side effects, or if you returned to smoking after this treatment, speak with your health care professional about whether another course of this medicine may be right for you. A special MedGuide will be given to you by the pharmacist with each prescription and refill. Be sure to read this information carefully each time. Talk to your pediatrician regarding the use of this medicine in children. This medicine is not approved for use in children. Overdosage: If you think you have taken too much of this medicine contact a poison control center or emergency room at once. NOTE: This medicine is only for you. Do not share this medicine with others. What if I miss a dose? If you miss a dose, take it as soon as you can. If it is almost time for your next dose, take only that dose. Do not take double or extra doses. What may interact with this medicine? -alcohol or any product that contains alcohol -insulin -other stop smoking aids -theophylline -warfarin This list may not describe all possible interactions. Give your health care provider a list of all the medicines, herbs, non-prescription drugs, or dietary supplements you use. Also tell them if you smoke, drink alcohol, or use illegal drugs. Some items may interact with your medicine. What should I watch for while using this medicine? Visit your doctor or health care professional for regular check ups. Ask for ongoing advice and encouragement from your doctor or healthcare professional, friends, and family to help you quit. If you smoke while on  this medication, quit again Your mouth may get dry. Chewing sugarless gum or sucking hard candy, and drinking plenty of water may help. Contact your doctor if the problem does not go away or is severe. You may get drowsy or dizzy. Do not drive, use machinery, or do anything that needs mental alertness until you know how this medicine affects you. Do  not stand or sit up quickly, especially if you are an older patient. This reduces the risk of dizzy or fainting spells. Sleepwalking can happen during treatment with this medicine, and can sometimes lead to behavior that is harmful to you, other people, or property. Stop taking this medicine and tell your doctor if you start sleepwalking or have other unusual sleep-related activity. Decrease the amount of alcoholic beverages that you drink during treatment with this medicine until you know if this medicine affects your ability to tolerate alcohol. Some people have experienced increased drunkenness (intoxication), unusual or sometimes aggressive behavior, or no memory of things that have happened (amnesia) during treatment with this medicine. The use of this medicine may increase the chance of suicidal thoughts or actions. Pay special attention to how you are responding while on this medicine. Any worsening of mood, or thoughts of suicide or dying should be reported to your health care professional right away. What side effects may I notice from receiving this medicine? Side effects that you should report to your doctor or health care professional as soon as possible: -allergic reactions like skin rash, itching or hives, swelling of the face, lips, tongue, or throat -acting aggressive, being angry or violent, or acting on dangerous impulses -breathing problems -changes in vision -chest pain or chest tightness -confusion, trouble speaking or understanding -new or worsening depression, anxiety, or panic attacks -extreme increase in activity and talking (mania) -fast, irregular heartbeat -feeling faint or lightheaded, falls -fever -pain in legs when walking -problems with balance, talking, walking -redness, blistering, peeling or loosening of the skin, including inside the mouth -ringing in ears -seeing or hearing things that aren't there (hallucinations) -seizures -sleepwalking -sudden numbness  or weakness of the face, arm or leg -thoughts about suicide or dying, or attempts to commit suicide -trouble passing urine or change in the amount of urine -unusual bleeding or bruising -unusually weak or tired Side effects that usually do not require medical attention (report to your doctor or health care professional if they continue or are bothersome): -constipation -headache -nausea, vomiting -strange dreams -stomach gas -trouble sleeping This list may not describe all possible side effects. Call your doctor for medical advice about side effects. You may report side effects to FDA at 1-800-FDA-1088. Where should I keep my medicine? Keep out of the reach of children. Store at room temperature between 15 and 30 degrees C (59 and 86 degrees F). Throw away any unused medicine after the expiration date. NOTE: This sheet is a summary. It may not cover all possible information. If you have questions about this medicine, talk to your doctor, pharmacist, or health care provider.    2016, Elsevier/[employer] Standard. (2014-10-04 16:14:23)    Smoking Cessation, Tips for Success If you are ready to quit smoking, congratulations! You have chosen to help yourself be healthier. Cigarettes bring nicotine, tar, carbon monoxide, and other irritants into your body. Your lungs, heart, and blood vessels will be able to work better without these poisons. There are many different ways to quit smoking. Nicotine gum, nicotine patches, a nicotine inhaler, or nicotine nasal spray can help with physical  craving. Hypnosis, support groups, and medicines help break the habit of smoking. WHAT THINGS CAN I DO TO MAKE QUITTING EASIER?  Here are some tips to help you quit for good:  Pick a date when you will quit smoking completely. Tell all of your friends and family about your plan to quit on that date.  Do not try to slowly cut down on the number of cigarettes you are smoking. Pick a quit date and quit smoking  completely starting on that day.  Throw away all cigarettes.   Clean and remove all ashtrays from your home, work, and car.  On a card, write down your reasons for quitting. Carry the card with you and read it when you get the urge to smoke.  Cleanse your body of nicotine. Drink enough water and fluids to keep your urine clear or pale yellow. Do this after quitting to flush the nicotine from your body.  Learn to predict your moods. Do not let a bad situation be your excuse to have a cigarette. Some situations in your life might tempt you into wanting a cigarette.  Never have "just one" cigarette. It leads to wanting another and another. Remind yourself of your decision to quit.  Change habits associated with smoking. If you smoked while driving or when feeling stressed, try other activities to replace smoking. Stand up when drinking your coffee. Brush your teeth after eating. Sit in a different chair when you read the paper. Avoid alcohol while trying to quit, and try to drink fewer caffeinated beverages. Alcohol and caffeine may urge you to smoke.  Avoid foods and drinks that can trigger a desire to smoke, such as sugary or spicy foods and alcohol.  Ask people who smoke not to smoke around you.  Have something planned to do right after eating or having a cup of coffee. For example, plan to take a walk or exercise.  Try a relaxation exercise to calm you down and decrease your stress. Remember, you may be tense and nervous for the first 2 weeks after you quit, but this will pass.  Find new activities to keep your hands busy. Play with a pen, coin, or rubber band. Doodle or draw things on paper.  Brush your teeth right after eating. This will help cut down on the craving for the taste of tobacco after meals. You can also try mouthwash.   Use oral substitutes in place of cigarettes. Try using lemon drops, carrots, cinnamon sticks, or chewing gum. Keep them handy so they are available when  you have the urge to smoke.  When you have the urge to smoke, try deep breathing.  Designate your home as a nonsmoking area.  If you are a heavy smoker, ask your health care provider about a prescription for nicotine chewing gum. It can ease your withdrawal from nicotine.  Reward yourself. Set aside the cigarette money you save and buy yourself something nice.  Look for support from others. Join a support group or smoking cessation program. Ask someone at home or at work to help you with your plan to quit smoking.  Always ask yourself, "Do I need this cigarette or is this just a reflex?" Tell yourself, "Today, I choose not to smoke," or "I do not want to smoke." You are reminding yourself of your decision to quit.  Do not replace cigarette smoking with electronic cigarettes (commonly called e-cigarettes). The safety of e-cigarettes is unknown, and some may contain harmful chemicals.  If you relapse,  do not give up! Plan ahead and think about what you will do the next time you get the urge to smoke. HOW WILL I FEEL WHEN I QUIT SMOKING? You may have symptoms of withdrawal because your body is used to nicotine (the addictive substance in cigarettes). You may crave cigarettes, be irritable, feel very hungry, cough often, get headaches, or have difficulty concentrating. The withdrawal symptoms are only temporary. They are strongest when you first quit but will go away within 10-14 days. When withdrawal symptoms occur, stay in control. Think about your reasons for quitting. Remind yourself that these are signs that your body is healing and getting used to being without cigarettes. Remember that withdrawal symptoms are easier to treat than the major diseases that smoking can cause.  Even after the withdrawal is over, expect periodic urges to smoke. However, these cravings are generally short lived and will go away whether you smoke or not. Do not smoke! WHAT RESOURCES ARE AVAILABLE TO HELP ME QUIT  SMOKING? Your health care provider can direct you to community resources or hospitals for support, which may include:  Group support.  Education.  Hypnosis.  Therapy.   This information is not intended to replace advice given to you by your health care provider. Make sure you discuss any questions you have with your health care provider.   Document Released: 10/18/2003 Document Revised: 02/09/2014 Document Reviewed: 07/07/2012 Elsevier Interactive Patient Education [employer].

## 2015-02-14 NOTE — Progress Notes (Signed)
    MRN: [id number] DOB: 1960-05-19  Subjective:   Ian Salazar is a 55 y.o. male presenting for follow up on uncontrolled diabetes. Reports that he is checking his blood sugar at home twice daily, usually runs between 116 (fasting) - 160 two hours post-prandial. Denies chest pain, shob, n/v, abdominal pain, loose stools, polydipsia, polyuria, foot infections. He has not had a diabetic eye exam in years. He has made significant dietary changes and plans on continuing these efforts. Denies adverse effects from Metformin and lisinopril.  Back spasms - patient notes significant improvement in his back pain, spasms with Flexeril. He admits bad posture in his ADL and knows that it gets worse with this so he plans on working on this and stretching before strenuous activity.  Smoking - patient was smoking 1ppd but is down to 1 pack every 3-4 days. He would like to start Chantix to help him quit smoking for good. His wife is a former smoker and would like to be right there with her. He also reports having a new grandchild over the holidays and is very excited about this.  Jaymari has a current medication list which includes the following prescription(s): cyclobenzaprine, lisinopril, metformin, accu-chek softclix lancets, blood glucose meter kit and supplies, and glucose blood. Also has No Known Allergies.  Abubakr  has a past medical history of Type II or unspecified type diabetes mellitus without mention of complication, not stated as uncontrolled; Rotator cuff tendonitis; Abnormal EKG; Anemia; and Knee pain, right. Also  has past surgical history that includes Multiple tooth extractions.  Objective:   Vitals: BP 114/71 mmHg  Pulse 106  Temp(Src) 98.5 F (36.9 C) (Oral)  Resp 16  Ht '5\' 9"'$  (1.753 m)  Wt 226 lb 3.2 oz (102.604 kg)  BMI 33.39 kg/m2  SpO2 96%  Physical Exam  Constitutional: He is oriented to person, place, and time. He appears well-developed and well-nourished.  HENT:    Mouth/Throat: Oropharynx is clear and moist.  Eyes: Pupils are equal, round, and reactive to light. No scleral icterus.  Cardiovascular: Normal rate, regular rhythm and intact distal pulses.  Exam reveals no gallop and no friction rub.   No murmur heard. Pulmonary/Chest: No respiratory distress. He has no wheezes. He has no rales.  Abdominal: Soft. Bowel sounds are normal. He exhibits no distension and no mass. There is no tenderness.  Musculoskeletal: He exhibits no edema.  Feet without skin defects. Slightly hyperkeratotic great toe nails.  Neurological: He is alert and oriented to person, place, and time.  Skin: Skin is warm and dry. No rash noted. No erythema. No pallor.    Assessment and Plan :   1. Uncontrolled type 2 diabetes mellitus without complication, without long-term current use of insulin ([place]) - Will wait until next visit to recheck A1c. Home blood sugars are reassuring. Continue Metformin and lisinopril.  2. Muscle spasm of back - Improved, recommended modification of daily activities and Flexeril PRN.  3. Tobacco use disorder - Counseled on smoking cessation, start Chantix. F/u in 3 months or sooner if adverse effects develop.  Jaynee Eagles, PA-C [facility] [phone] 02/14/2015 3:40 PM

## 2015-03-15 ENCOUNTER — Ambulatory Visit (AMBULATORY_SURGERY_CENTER): Payer: Self-pay | Admitting: *Deleted

## 2015-03-15 VITALS — Ht 69.5 in | Wt 232.0 lb

## 2015-03-15 DIAGNOSIS — Z1211 Encounter for screening for malignant neoplasm of colon: Secondary | ICD-10-CM

## 2015-03-15 NOTE — Progress Notes (Signed)
No egg or soy allergy known to patient  No issues with past sedation with any surgeries  or procedures, no intubation problems  No diet pills per patient No home 02 use per patient  No blood thinners per patient   emmi video to e mail

## 2015-03-21 LAB — HM COLONOSCOPY

## 2015-03-25 HISTORY — PX: COLONOSCOPY W/ POLYPECTOMY: SHX1380

## 2015-03-28 DIAGNOSIS — Z860101 Personal history of adenomatous and serrated colon polyps: Secondary | ICD-10-CM | POA: Insufficient documentation

## 2015-03-28 DIAGNOSIS — Z86010 Personal history of colonic polyps: Secondary | ICD-10-CM | POA: Insufficient documentation

## 2015-03-28 HISTORY — DX: Personal history of adenomatous and serrated colon polyps: Z86.0101

## 2015-03-28 HISTORY — DX: Personal history of colonic polyps: Z86.010

## 2015-03-29 ENCOUNTER — Encounter: Payer: Self-pay | Admitting: Internal Medicine

## 2015-03-29 ENCOUNTER — Ambulatory Visit: Payer: BLUE CROSS/BLUE SHIELD | Admitting: Internal Medicine

## 2015-03-29 VITALS — BP 116/71 | HR 82 | Temp 97.8°F | Resp 15 | Ht 69.5 in | Wt 232.0 lb

## 2015-03-29 DIAGNOSIS — Z1211 Encounter for screening for malignant neoplasm of colon: Secondary | ICD-10-CM

## 2015-03-29 DIAGNOSIS — D123 Benign neoplasm of transverse colon: Secondary | ICD-10-CM | POA: Diagnosis not present

## 2015-03-29 DIAGNOSIS — D124 Benign neoplasm of descending colon: Secondary | ICD-10-CM | POA: Diagnosis not present

## 2015-03-29 LAB — HM COLONOSCOPY

## 2015-03-29 MED ORDER — SODIUM CHLORIDE 0.9 % IV SOLN
500.0000 mL | INTRAVENOUS | Status: DC
Start: 2015-03-29 — End: 2015-03-29

## 2015-03-29 NOTE — Patient Instructions (Addendum)
I found and removed 2 tiny polyps.  I will let you know pathology results and when to have another routine colonoscopy by mail.  I appreciate the opportunity to care for you. Gatha Mayer, MD, [facility]  Polyps (handout given)  YOU HAD AN ENDOSCOPIC PROCEDURE TODAY AT [place]:   Refer to the procedure report that was given to you for any specific questions about what was found during the examination.  If the procedure report does not answer your questions, please call your gastroenterologist to clarify.  If you requested that your care partner not be given the details of your procedure findings, then the procedure report has been included in a sealed envelope for you to review at your convenience later.  YOU SHOULD EXPECT: Some feelings of bloating in the abdomen. Passage of more gas than usual.  Walking can help get rid of the air that was put into your GI tract during the procedure and reduce the bloating. If you had a lower endoscopy (such as a colonoscopy or flexible sigmoidoscopy) you may notice spotting of blood in your stool or on the toilet paper. If you underwent a bowel prep for your procedure, you may not have a normal bowel movement for a few days.  Please Note:  You might notice some irritation and congestion in your nose or some drainage.  This is from the oxygen used during your procedure.  There is no need for concern and it should clear up in a day or so.  SYMPTOMS TO REPORT IMMEDIATELY:   Following lower endoscopy (colonoscopy or flexible sigmoidoscopy):  Excessive amounts of blood in the stool  Significant tenderness or worsening of abdominal pains  Swelling of the abdomen that is new, acute  Fever of 100F or higher   For urgent or emergent issues, a gastroenterologist can be reached at any hour by calling [phone].   DIET: Your first meal following the procedure should be a small meal and then it is ok to progress to your normal diet.  Heavy or fried foods are harder to digest and may make you feel nauseous or bloated.  Likewise, meals heavy in dairy and vegetables can increase bloating.  Drink plenty of fluids but you should avoid alcoholic beverages for 24 hours.  ACTIVITY:  You should plan to take it easy for the rest of today and you should NOT DRIVE or use heavy machinery until tomorrow (because of the sedation medicines used during the test).    FOLLOW UP: Our staff will call the number listed on your records the next business day following your procedure to check on you and address any questions or concerns that you may have regarding the information given to you following your procedure. If we do not reach you, we will leave a message.  However, if you are feeling well and you are not experiencing any problems, there is no need to return our call.  We will assume that you have returned to your regular daily activities without incident.  If any biopsies were taken you will be contacted by phone or by letter within the next 1-3 weeks.  Please call us at [phone] if you have not heard about the biopsies in 3 weeks.    SIGNATURES/CONFIDENTIALITY: You and/or your care partner have signed paperwork which will be entered into your electronic medical record.  These signatures attest to the fact that that the information above on your After Visit Summary has been reviewed  and is understood.  Full responsibility of the confidentiality of this discharge information lies with you and/or your care-partner.

## 2015-03-29 NOTE — Progress Notes (Signed)
Called to room to assist during endoscopic procedure.  Patient ID and intended procedure confirmed with present staff. Received instructions for my participation in the procedure from the performing physician.  

## 2015-03-29 NOTE — Op Note (Signed)
[facility]  [address] [place], [id number]   COLONOSCOPY PROCEDURE REPORT  PATIENT: Ian, Salazar  MR#: [id number] BIRTHDATE: 11-01-1960 , 36  yrs. old GENDER: male ENDOSCOPIST: Gatha Mayer, MD, [facility] PROCEDURE DATE:  03/29/2015 PROCEDURE:   Colonoscopy, screening and Colonoscopy with biopsy First Screening Colonoscopy - Avg.  risk and is 50 yrs.  old or older Yes.  Prior Negative Screening - Now for repeat screening. N/A  History of Adenoma - Now for follow-up colonoscopy & has been > or = to 3 yrs.  N/A  Polyps removed today? Yes ASA CLASS:   Class II INDICATIONS:Screening for colonic neoplasia and Colorectal Neoplasm Risk Assessment for this procedure is average risk. MEDICATIONS: Propofol 300 mg IV, Monitored anesthesia care, and Lidocaine 40 mg IV  DESCRIPTION OF PROCEDURE:   After the risks benefits and alternatives of the procedure were thoroughly explained, informed consent was obtained.  The digital rectal exam revealed no abnormalities of the rectum, revealed no prostatic nodules, and revealed the prostate was not enlarged.   The [id number] endoscope was introduced through the anus and advanced to the cecum, which was identified by both the appendix and ileocecal valve. No adverse events experienced.   The quality of the prep was excellent.  (MiraLax was used)  The instrument was then slowly withdrawn as the colon was fully examined. Estimated blood loss is zero unless otherwise noted in this procedure report.      COLON FINDINGS: Two polypoid shaped sessile polyps ranging from 2 to 55mm in size were found in the descending colon and transverse colon.  Polypectomies were performed with cold forceps.  The resection was complete, the polyp tissue was completely retrieved and sent to histology.   The examination was otherwise normal. Retroflexed views revealed no abnormalities. The time to cecum = 4.0 Withdrawal time = 12.0   The  scope was withdrawn and the procedure completed. COMPLICATIONS: There were no immediate complications.  ENDOSCOPIC IMPRESSION: 1.   Two sessile polyps ranging from 2 to 23mm in size were found in the descending colon and transverse colon; polypectomies were performed with cold forceps 2.   The examination was otherwise normal - excellent prep - first screening  RECOMMENDATIONS: Timing of repeat colonoscopy will be determined by pathology findings.  eSigned:  Gatha Mayer, MD, [facility] 03/29/2015 12:04 PM   cc: The Patient and Jaynee Eagles, PA-C

## 2015-03-29 NOTE — Progress Notes (Signed)
Stable to RR 

## 2015-03-31 NOTE — Addendum Note (Signed)
Addended by: Janith Lima on: 03/31/2015 12:56 PM   Modules accepted: Miquel Dunn

## 2015-04-01 ENCOUNTER — Telehealth: Payer: Self-pay | Admitting: *Deleted

## 2015-04-01 NOTE — Telephone Encounter (Signed)
No answer. Name identifier. Message left to call if questions or concerns. 

## 2015-04-04 ENCOUNTER — Encounter: Payer: Self-pay | Admitting: Internal Medicine

## 2015-04-04 DIAGNOSIS — Z86010 Personal history of colonic polyps: Secondary | ICD-10-CM

## 2015-04-04 NOTE — Progress Notes (Signed)
Quick Note:  2 adenomas - diminutive Recall 2022 colonoscopy ______

## 2015-05-03 ENCOUNTER — Encounter: Payer: Self-pay | Admitting: Family Medicine

## 2015-05-16 ENCOUNTER — Ambulatory Visit: Payer: BLUE CROSS/BLUE SHIELD | Admitting: Urgent Care

## 2016-12-12 ENCOUNTER — Encounter: Payer: Self-pay | Admitting: Urgent Care

## 2016-12-12 ENCOUNTER — Ambulatory Visit: Payer: PRIVATE HEALTH INSURANCE | Admitting: Urgent Care

## 2016-12-12 VITALS — BP 147/95 | HR 113 | Temp 98.3°F | Resp 16 | Ht 69.5 in | Wt 222.2 lb

## 2016-12-12 DIAGNOSIS — E669 Obesity, unspecified: Secondary | ICD-10-CM

## 2016-12-12 DIAGNOSIS — Z794 Long term (current) use of insulin: Secondary | ICD-10-CM | POA: Diagnosis not present

## 2016-12-12 DIAGNOSIS — IMO0002 Reserved for concepts with insufficient information to code with codable children: Secondary | ICD-10-CM

## 2016-12-12 DIAGNOSIS — Z6832 Body mass index (BMI) 32.0-32.9, adult: Secondary | ICD-10-CM

## 2016-12-12 DIAGNOSIS — R030 Elevated blood-pressure reading, without diagnosis of hypertension: Secondary | ICD-10-CM

## 2016-12-12 DIAGNOSIS — E1165 Type 2 diabetes mellitus with hyperglycemia: Secondary | ICD-10-CM

## 2016-12-12 LAB — POCT GLYCOSYLATED HEMOGLOBIN (HGB A1C): HEMOGLOBIN A1C: 11.4

## 2016-12-12 MED ORDER — LISINOPRIL 10 MG PO TABS: 10.0000 mg | ORAL_TABLET | Freq: Every day | ORAL | 1 refills | Status: DC

## 2016-12-12 MED ORDER — INSULIN GLARGINE 100 UNITS/ML SOLOSTAR PEN: 20.0000 [IU] | PEN_INJECTOR | Freq: Every day | SUBCUTANEOUS | 1 refills | Status: DC

## 2016-12-12 MED ORDER — METFORMIN HCL 1000 MG PO TABS: 1000.0000 mg | ORAL_TABLET | Freq: Two times a day (BID) | ORAL | 1 refills | Status: DC

## 2016-12-12 MED ORDER — BLOOD GLUCOSE MONITOR KIT: PACK | 0 refills | Status: AC

## 2016-12-12 NOTE — Progress Notes (Addendum)
MRN: [id number]  Subjective:   Ian Salazar is a 56 y.o. male who presents for follow up of Type 2 Diabetes Mellitus.   Patient is currently out of metformin for the past 3 months. Also does not have his lisinopril. Patient is not checking home blood sugars. Admits occasional (once a month) lower abdominal pain that lasts 1-2 days and resolves with use of Zantac. Patient denies blurred vision, polydipsia, chest pain, nausea, vomiting, hematuria, polyuria, skin infections, numbness or tingling. Patient is checking their feet daily. Denies foot concerns. Diet is somewhat compliant. He does drink beers, eats potatoes. Patient is not actively exercising.  Known diabetic complications: none  Ian Salazar has a current medication list which includes the following prescription(s): accu-chek softclix lancets, blood glucose meter kit and supplies, glucose blood, lisinopril, and metformin. Also has No Known Allergies.  Ian Salazar  has a past medical history of Abnormal EKG, Anemia, GERD (gastroesophageal reflux disease), adenomatous colonic polyps (03/28/2015), Knee pain, right, Rotator cuff tendonitis, and Type II or unspecified type diabetes mellitus without mention of complication, not stated as uncontrolled. Also  has a past surgical history that includes Multiple tooth extractions.  Immunizations: Flu vaccine updated 11/02/2016.  Objective:   PHYSICAL EXAM BP (!) 147/95   Pulse (!) 113   Temp 98.3 F (36.8 C) (Oral)   Resp 16   Ht 5' 9.5" (1.765 m)   Wt 222 lb 3.2 oz (100.8 kg)   SpO2 100%   BMI 32.34 kg/m   BP Readings from Last 3 Encounters:  12/12/16 (!) 147/95  03/29/15 116/71  02/14/15 114/71    The 10-year ASCVD risk score Mikey Bussing DC Jr., et al., 2013) is: 24.6%   Values used to calculate the score:     Age: 29 years     Sex: Male     Is Non-Hispanic African American: Yes     Diabetic: Yes     Tobacco smoker: Yes     Systolic Blood Pressure: 825 mmHg     Is BP treated: No  HDL Cholesterol: 62 mg/dL     Total Cholesterol: 190 mg/dL   Physical Exam  Constitutional: He is oriented to person, place, and time. He appears well-developed and well-nourished.  HENT:  Mouth/Throat: Oropharynx is clear and moist.  Eyes: No scleral icterus.  Cardiovascular: Normal rate, regular rhythm and intact distal pulses. Exam reveals no gallop and no friction rub.  No murmur heard. Pulmonary/Chest: No respiratory distress. He has no wheezes. He has no rales.  Abdominal: Soft. Bowel sounds are normal. He exhibits no distension and no mass. There is no tenderness. There is no guarding.  Musculoskeletal: He exhibits no edema.  Neurological: He is alert and oriented to person, place, and time.  Skin: Skin is warm and dry.  Psychiatric: He has a normal mood and affect.   Diabetic Foot Exam - Simple   Simple Foot Form Diabetic Foot exam was performed with the following findings:  Yes 12/12/2016  9:33 AM  Visual Inspection No deformities, no ulcerations, no other skin breakdown bilaterally:  Yes Sensation Testing Intact to touch and monofilament testing bilaterally:  Yes Pulse Check Posterior Tibialis and Dorsalis pulse intact bilaterally:  Yes Comments    Results for orders placed or performed in visit on 12/12/16 (from the past 24 hour(s))  POCT glycosylated hemoglobin (Hb A1C)     Status: None   Collection Time: 12/12/16 10:01 AM  Result Value Ref Range   Hemoglobin A1C 11.4  Assessment and Plan :   1. Uncontrolled type 2 diabetes mellitus with hyperglycemia ([place]) 2. Uncontrolled type 2 diabetes mellitus with insulin therapy ([place]) 3. Elevated blood pressure reading 4. Class 1 obesity without serious comorbidity with body mass index (BMI) of 32.0 to 32.9 in adult, unspecified obesity type - Counseled on need for major dietary modifications, exercise. Patient will start Lantus, restart Metformin. Check fasting blood sugar levels daily. Return-to-clinic precautions  discussed, patient verbalized understanding. Otherwise, f/u in 4 weeks. Refilled lisinopril, increased this to 47m daily. Labs pending.  MJaynee Eagles PA-C [facility] [phone]/11/2016 9:34 AM

## 2016-12-12 NOTE — Patient Instructions (Addendum)
Metformin Dosing (to be taken with food) Week 1: take 1/2 tablet twice a day. Week 2: take 1 tablet in the morning, 1/2 tablet at night. Week 3: take 1 tablets twice a day.      Hypertension Hypertension is another name for high blood pressure. High blood pressure forces your heart to work harder to pump blood. This can cause problems over time. There are two numbers in a blood pressure reading. There is a top number (systolic) over a bottom number (diastolic). It is best to have a blood pressure below 120/80. Healthy choices can help lower your blood pressure. You may need medicine to help lower your blood pressure if:  Your blood pressure cannot be lowered with healthy choices.  Your blood pressure is higher than 130/80.  Follow these instructions at home: Eating and drinking  If directed, follow the DASH eating plan. This diet includes: ? Filling half of your plate at each meal with fruits and vegetables. ? Filling one quarter of your plate at each meal with whole grains. Whole grains include whole wheat pasta, brown rice, and whole grain bread. ? Eating or drinking low-fat dairy products, such as skim milk or low-fat yogurt. ? Filling one quarter of your plate at each meal with low-fat (lean) proteins. Low-fat proteins include fish, skinless chicken, eggs, beans, and tofu. ? Avoiding fatty meat, cured and processed meat, or chicken with skin. ? Avoiding premade or processed food.  Eat less than 1,500 mg of salt (sodium) a day.  Limit alcohol use to no more than 1 drink a day for nonpregnant women and 2 drinks a day for men. One drink equals 12 oz of beer, 5 oz of wine, or 1 oz of hard liquor. Lifestyle  Work with your doctor to stay at a healthy weight or to lose weight. Ask your doctor what the best weight is for you.  Get at least 30 minutes of exercise that causes your heart to beat faster (aerobic exercise) most days of the week. This may include walking, swimming, or  biking.  Get at least 30 minutes of exercise that strengthens your muscles (resistance exercise) at least 3 days a week. This may include lifting weights or pilates.  Do not use any products that contain nicotine or tobacco. This includes cigarettes and e-cigarettes. If you need help quitting, ask your doctor.  Check your blood pressure at home as told by your doctor.  Keep all follow-up visits as told by your doctor. This is important. Medicines  Take over-the-counter and prescription medicines only as told by your doctor. Follow directions carefully.  Do not skip doses of blood pressure medicine. The medicine does not work as well if you skip doses. Skipping doses also puts you at risk for problems.  Ask your doctor about side effects or reactions to medicines that you should watch for. Contact a doctor if:  You think you are having a reaction to the medicine you are taking.  You have headaches that keep coming back (recurring).  You feel dizzy.  You have swelling in your ankles.  You have trouble with your vision. Get help right away if:  You get a very bad headache.  You start to feel confused.  You feel weak or numb.  You feel faint.  You get very bad pain in your: ? Chest. ? Belly (abdomen).  You throw up (vomit) more than once.  You have trouble breathing. Summary  Hypertension is another name for high blood pressure.  Making healthy choices can help lower blood pressure. If your blood pressure cannot be controlled with healthy choices, you may need to take medicine. This information is not intended to replace advice given to you by your health care provider. Make sure you discuss any questions you have with your health care provider. Document Released: 07/08/2007 Document Revised: 12/18/2015 Document Reviewed: 12/18/2015 Elsevier Interactive Patient Education  2018 [employer].    Type 2 Diabetes Mellitus, Diagnosis, Adult Type 2 diabetes (type 2  diabetes mellitus) is a long-term (chronic) disease. It may be caused by one or both of these problems:  Your body does not make enough of a hormone called insulin.  Your body does not react in a normal way to insulin that it makes.  Insulin lets sugars (glucose) go into cells in the body. This gives you energy. If you have type 2 diabetes, sugars cannot get into cells. This causes high blood sugar (hyperglycemia). Your doctor will set treatment goals for you. Generally, you should have these blood sugar levels:  Before meals (preprandial): 80-130 mg/dL (4.4-7.2 mmol/L).  After meals (postprandial): below 180 mg/dL (10 mmol/L).  A1c (hemoglobin A1c) level: less than 7%.  Follow these instructions at home: Questions to Ask Your Doctor  You may want to ask these questions:  Do I need to meet with a diabetes educator?  Where can I find a support group for people with diabetes?  What equipment will I need to care for myself at home?  What diabetes medicines do I need? When should I take them?  How often do I need to check my blood sugar?  What number can I call if I have questions?  When is my next doctor's visit?  General instructions  Take over-the-counter and prescription medicines only as told by your doctor.  Keep all follow-up visits as told by your doctor. This is important. Contact a doctor if:  Your blood sugar is at or above 240 mg/dL (13.3 mmol/L) for 2 days in a row.  You have been sick or have had a fever for 2 days or more and you are not getting better.  You have any of these problems for more than 6 hours: ? You cannot eat or drink. ? You feel sick to your stomach (nauseous). ? You throw up (vomit). ? You have watery poop (diarrhea). Get help right away if:  Your blood sugar is lower than 54 mg/dL (3 mmol/L).  You get confused.  You have trouble: ? Thinking clearly. ? Breathing.  You have moderate or large ketone levels in your pee  (urine). This information is not intended to replace advice given to you by your health care provider. Make sure you discuss any questions you have with your health care provider. Document Released: 10/29/2007 Document Revised: 06/27/2015 Document Reviewed: 02/22/2015 Elsevier Interactive Patient Education  2018 [employer].     IF you received an x-ray today, you will receive an invoice from [facility]. Please contact [facility] at [phone] with questions or concerns regarding your invoice.   IF you received labwork today, you will receive an invoice from [place] [facility] [facility]. Please contact [facility] at [phone] with questions or concerns regarding your invoice.   Our billing staff will not be able to assist you with questions regarding bills from these companies.  You will be contacted with the lab results as soon as they are available. The fastest way to get your results is to activate your My Chart account. Instructions are located  on the last page of this paperwork. If you have not heard from [place] regarding the results in 2 weeks, please contact this office.

## 2016-12-13 LAB — LIPID PANEL
CHOL/HDL RATIO: 3.1 ratio (ref 0.0–5.0)
Cholesterol, Total: 190 mg/dL (ref 100–199)
HDL: 62 mg/dL (ref >39)
LDL Calculated: 100 mg/dL — ABNORMAL HIGH (ref 0–99)
TRIGLYCERIDES: 140 mg/dL (ref 0–149)
VLDL Cholesterol Cal: 28 mg/dL (ref 5–40)

## 2016-12-13 LAB — COMPREHENSIVE METABOLIC PANEL
A/G RATIO: 1.9 (ref 1.2–2.2)
ALT: 39 IU/L (ref 0–44)
AST: 31 IU/L (ref 0–40)
Albumin: 4.6 g/dL (ref 3.5–5.5)
Alkaline Phosphatase: 71 IU/L (ref 39–117)
BUN/Creatinine Ratio: 10 (ref 9–20)
BUN: 9 mg/dL (ref 6–24)
CALCIUM: 10.5 mg/dL — AB (ref 8.7–10.2)
CO2: 23 mmol/L (ref 20–29)
Chloride: 98 mmol/L (ref 96–106)
Creatinine, Ser: 0.87 mg/dL (ref 0.76–1.27)
GFR calc Af Amer: 112 mL/min/{1.73_m2} (ref >59)
GFR, EST NON AFRICAN AMERICAN: 96 mL/min/{1.73_m2} (ref >59)
Globulin, Total: 2.4 g/dL (ref 1.5–4.5)
Glucose: 242 mg/dL — ABNORMAL HIGH (ref 65–99)
POTASSIUM: 4.1 mmol/L (ref 3.5–5.2)
SODIUM: 141 mmol/L (ref 134–144)
Total Protein: 7 g/dL (ref 6.0–8.5)

## 2016-12-17 ENCOUNTER — Other Ambulatory Visit: Payer: Self-pay | Admitting: Urgent Care

## 2016-12-17 MED ORDER — ATORVASTATIN CALCIUM 20 MG PO TABS: 20.0000 mg | ORAL_TABLET | Freq: Every day | ORAL | 3 refills | Status: DC

## 2017-01-21 ENCOUNTER — Ambulatory Visit: Payer: PRIVATE HEALTH INSURANCE | Admitting: Urgent Care

## 2017-01-21 VITALS — BP 122/70 | HR 92 | Temp 98.1°F | Resp 16 | Ht 69.5 in | Wt 225.0 lb

## 2017-01-21 DIAGNOSIS — Z794 Long term (current) use of insulin: Secondary | ICD-10-CM | POA: Diagnosis not present

## 2017-01-21 DIAGNOSIS — I10 Essential (primary) hypertension: Secondary | ICD-10-CM | POA: Diagnosis not present

## 2017-01-21 DIAGNOSIS — R0789 Other chest pain: Secondary | ICD-10-CM | POA: Diagnosis not present

## 2017-01-21 DIAGNOSIS — IMO0002 Reserved for concepts with insufficient information to code with codable children: Secondary | ICD-10-CM

## 2017-01-21 DIAGNOSIS — E1165 Type 2 diabetes mellitus with hyperglycemia: Secondary | ICD-10-CM

## 2017-01-21 LAB — GLUCOSE, POCT (MANUAL RESULT ENTRY): POC GLUCOSE: 145 mg/dL — AB (ref 70–99)

## 2017-01-21 MED ORDER — OMEPRAZOLE 20 MG PO CPDR: 20.0000 mg | DELAYED_RELEASE_CAPSULE | Freq: Every day | ORAL | 1 refills | Status: DC

## 2017-01-21 MED ORDER — KETOCONAZOLE 2 % EX CREA: 1.0000 "application " | TOPICAL_CREAM | Freq: Every day | CUTANEOUS | 0 refills | Status: DC

## 2017-01-21 NOTE — Progress Notes (Signed)
   MRN: [id number] DOB: January 06, 1961  Subjective:   Ian Salazar is a 56 y.o. male presenting for follow up on Hypertension and diabetes. Currently managed with lisinopril, Lantus and metformin for HTN and diabetes respectively. His a1c check through his employer wellness program was 7.1% this past Monday. Glucometer readings are in the 90's for fasting levels this past week. Reports that he has had intermittent mid-sternal chest pain and epigastric, belly pain this past week. Typically occurs in the mornings after he drinks coffee. Has tried Ranitidine with some relief. Denies diaphoresis, dizziness, shortness of breath, heart racing, palpitations, nausea, vomiting, hematuria, lower leg swelling. Quit smoking this week.   [name] has a current medication list which includes the following prescription(s): accu-chek softclix lancets, blood glucose meter kit and supplies, glucose blood, insulin glargine, lisinopril, metformin, and atorvastatin. Also has No Known Allergies.  Chetan  has a past medical history of Abnormal EKG, Anemia, GERD (gastroesophageal reflux disease), adenomatous colonic polyps (03/28/2015), Knee pain, right, Rotator cuff tendonitis, and Type II or unspecified type diabetes mellitus without mention of complication, not stated as uncontrolled. Also  has a past surgical history that includes Multiple tooth extractions.  Objective:   Vitals: BP 122/70   Pulse 92   Temp 98.1 F (36.7 C) (Oral)   Resp 16   Ht 5' 9.5" (1.765 m)   Wt 225 lb (102.1 kg)   SpO2 96%   BMI 32.75 kg/m   Wt Readings from Last 3 Encounters:  01/21/17 225 lb (102.1 kg)  12/12/16 222 lb 3.2 oz (100.8 kg)  03/29/15 232 lb (105.2 kg)    Physical Exam  Constitutional: He is oriented to person, place, and time. He appears well-developed and well-nourished.  HENT:  Mouth/Throat: Oropharynx is clear and moist.  Cardiovascular: Normal rate, regular rhythm and intact distal pulses. Exam reveals no gallop and  no friction rub.  No murmur heard. Pulmonary/Chest: No respiratory distress. He has no wheezes. He has no rales.  Abdominal: Soft. Bowel sounds are normal. He exhibits no distension and no mass. There is tenderness (epigastric). There is no guarding.  Musculoskeletal: He exhibits no edema.  Neurological: He is alert and oriented to person, place, and time.  Skin: Skin is warm and dry.  Psychiatric: He has a normal mood and affect.   Results for orders placed or performed in visit on 01/21/17 (from the past 24 hour(s))  POCT glucose (manual entry)     Status: Abnormal   Collection Time: 01/21/17  9:20 AM  Result Value Ref Range   POC Glucose 145 (A) 70 - 99 mg/dl   ECG interpretation - normal sinus rhythm at 81bpm.   Assessment and Plan :   Uncontrolled type 2 diabetes mellitus with insulin therapy ([place]) - Plan: POCT glucose (manual entry), EKG 12-Lead  Essential hypertension - Plan: EKG 12-Lead  Atypical chest pain  Stop Lantus to avoid hypoglycemia, maintain Metformin. Blood pressure well controlled. Atypical chest pain may be caused by GERD, coffee. Recommended patient start Prilosec. Strict ER and return-to-clinic precautions discussed, patient verbalized understanding. Will refer to cardiology for heart disease screen. Recheck in 3 months.  Jaynee Eagles, PA-C [facility] [phone] 01/21/2017  8:51 AM

## 2017-01-21 NOTE — Patient Instructions (Addendum)
Food Choices for Gastroesophageal Reflux Disease, Adult When you have gastroesophageal reflux disease (GERD), the foods you eat and your eating habits are very important. Choosing the right foods can help ease the discomfort of GERD. Consider working with a diet and nutrition specialist (dietitian) to help you make healthy food choices. What general guidelines should I follow? Eating plan  Choose healthy foods low in fat, such as fruits, vegetables, whole grains, low-fat dairy products, and lean meat, fish, and poultry.  Eat frequent, small meals instead of three large meals each day. Eat your meals slowly, in a relaxed setting. Avoid bending over or lying down until 2-3 hours after eating.  Limit high-fat foods such as fatty meats or fried foods.  Limit your intake of oils, butter, and shortening to less than 8 teaspoons each day.  Avoid the following: ? Foods that cause symptoms. These may be different for different people. Keep a food diary to keep track of foods that cause symptoms. ? Alcohol. ? Drinking large amounts of liquid with meals. ? Eating meals during the 2-3 hours before bed.  Cook foods using methods other than frying. This may include baking, grilling, or broiling. Lifestyle   Maintain a healthy weight. Ask your health care provider what weight is healthy for you. If you need to lose weight, work with your health care provider to do so safely.  Exercise for at least 30 minutes on 5 or more days each week, or as told by your health care provider.  Avoid wearing clothes that fit tightly around your waist and chest.  Do not use any products that contain nicotine or tobacco, such as cigarettes and e-cigarettes. If you need help quitting, ask your health care provider.  Sleep with the head of your bed raised. Use a wedge under the mattress or blocks under the bed frame to raise the head of the bed. What foods are not recommended? The items listed may not be a complete  list. Talk with your dietitian about what dietary choices are best for you. Grains Pastries or quick breads with added fat. French toast. Vegetables Deep fried vegetables. French fries. Any vegetables prepared with added fat. Any vegetables that cause symptoms. For some people this may include tomatoes and tomato products, chili peppers, onions and garlic, and horseradish. Fruits Any fruits prepared with added fat. Any fruits that cause symptoms. For some people this may include citrus fruits, such as oranges, grapefruit, pineapple, and lemons. Meats and other protein foods High-fat meats, such as fatty beef or pork, hot dogs, ribs, ham, sausage, salami and bacon. Fried meat or protein, including fried fish and fried chicken. Nuts and nut butters. Dairy Whole milk and chocolate milk. Sour cream. Cream. Ice cream. Cream cheese. Milk shakes. Beverages Coffee and tea, with or without caffeine. Carbonated beverages. Sodas. Energy drinks. Fruit juice made with acidic fruits (such as orange or grapefruit). Tomato juice. Alcoholic drinks. Fats and oils Butter. Margarine. Shortening. Ghee. Sweets and desserts Chocolate and cocoa. Donuts. Seasoning and other foods Pepper. Peppermint and spearmint. Any condiments, herbs, or seasonings that cause symptoms. For some people, this may include curry, hot sauce, or vinegar-based salad dressings. Summary  When you have gastroesophageal reflux disease (GERD), food and lifestyle choices are very important to help ease the discomfort of GERD.  Eat frequent, small meals instead of three large meals each day. Eat your meals slowly, in a relaxed setting. Avoid bending over or lying down until 2-3 hours after eating.  Limit high-fat   foods such as fatty meat or fried foods. This information is not intended to replace advice given to you by your health care provider. Make sure you discuss any questions you have with your health care provider. Document Released:  01/19/2005 Document Revised: 01/21/2016 Document Reviewed: 01/21/2016 Elsevier Interactive Patient Education  2018 [employer].     Diabetes Mellitus and Nutrition When you have diabetes (diabetes mellitus), it is very important to have healthy eating habits because your blood sugar (glucose) levels are greatly affected by what you eat and drink. Eating healthy foods in the appropriate amounts, at about the same times every day, can help you:  Control your blood glucose.  Lower your risk of heart disease.  Improve your blood pressure.  Reach or maintain a healthy weight.  Every person with diabetes is different, and each person has different needs for a meal plan. Your health care provider may recommend that you work with a diet and nutrition specialist (dietitian) to make a meal plan that is best for you. Your meal plan may vary depending on factors such as:  The calories you need.  The medicines you take.  Your weight.  Your blood glucose, blood pressure, and cholesterol levels.  Your activity level.  Other health conditions you have, such as heart or kidney disease.  How do carbohydrates affect me? Carbohydrates affect your blood glucose level more than any other type of food. Eating carbohydrates naturally increases the amount of glucose in your blood. Carbohydrate counting is a method for keeping track of how many carbohydrates you eat. Counting carbohydrates is important to keep your blood glucose at a healthy level, especially if you use insulin or take certain oral diabetes medicines. It is important to know how many carbohydrates you can safely have in each meal. This is different for every person. Your dietitian can help you calculate how many carbohydrates you should have at each meal and for snack. Foods that contain carbohydrates include:  Bread, cereal, rice, pasta, and crackers.  Potatoes and corn.  Peas, beans, and lentils.  Milk and yogurt.  Fruit and  juice.  Desserts, such as cakes, cookies, ice cream, and candy.  How does alcohol affect me? Alcohol can cause a sudden decrease in blood glucose (hypoglycemia), especially if you use insulin or take certain oral diabetes medicines. Hypoglycemia can be a life-threatening condition. Symptoms of hypoglycemia (sleepiness, dizziness, and confusion) are similar to symptoms of having too much alcohol. If your health care provider says that alcohol is safe for you, follow these guidelines:  Limit alcohol intake to no more than 1 drink per day for nonpregnant women and 2 drinks per day for men. One drink equals 12 oz of beer, 5 oz of wine, or 1 oz of hard liquor.  Do not drink on an empty stomach.  Keep yourself hydrated with water, diet soda, or unsweetened iced tea.  Keep in mind that regular soda, juice, and other mixers may contain a lot of sugar and must be counted as carbohydrates.  What are tips for following this plan? Reading food labels  Start by checking the serving size on the label. The amount of calories, carbohydrates, fats, and other nutrients listed on the label are based on one serving of the food. Many foods contain more than one serving per package.  Check the total grams (g) of carbohydrates in one serving. You can calculate the number of servings of carbohydrates in one serving by dividing the total carbohydrates by 15. For  example, if a food has 30 g of total carbohydrates, it would be equal to 2 servings of carbohydrates.  Check the number of grams (g) of saturated and trans fats in one serving. Choose foods that have low or no amount of these fats.  Check the number of milligrams (mg) of sodium in one serving. Most people should limit total sodium intake to less than 2,300 mg per day.  Always check the nutrition information of foods labeled as "low-fat" or "nonfat". These foods may be higher in added sugar or refined carbohydrates and should be avoided.  Talk to your  dietitian to identify your daily goals for nutrients listed on the label. Shopping  Avoid buying canned, premade, or processed foods. These foods tend to be high in fat, sodium, and added sugar.  Shop around the outside edge of the grocery store. This includes fresh fruits and vegetables, bulk grains, fresh meats, and fresh dairy. Cooking  Use low-heat cooking methods, such as baking, instead of high-heat cooking methods like deep frying.  Cook using healthy oils, such as olive, canola, or sunflower oil.  Avoid cooking with butter, cream, or high-fat meats. Meal planning  Eat meals and snacks regularly, preferably at the same times every day. Avoid going long periods of time without eating.  Eat foods high in fiber, such as fresh fruits, vegetables, beans, and whole grains. Talk to your dietitian about how many servings of carbohydrates you can eat at each meal.  Eat 4-6 ounces of lean protein each day, such as lean meat, chicken, fish, eggs, or tofu. 1 ounce is equal to 1 ounce of meat, chicken, or fish, 1 egg, or 1/4 cup of tofu.  Eat some foods each day that contain healthy fats, such as avocado, nuts, seeds, and fish. Lifestyle   Check your blood glucose regularly.  Exercise at least 30 minutes 5 or more days each week, or as told by your health care provider.  Take medicines as told by your health care provider.  Do not use any products that contain nicotine or tobacco, such as cigarettes and e-cigarettes. If you need help quitting, ask your health care provider.  Work with a Social worker or diabetes educator to identify strategies to manage stress and any emotional and social challenges. What are some questions to ask my health care provider?  Do I need to meet with a diabetes educator?  Do I need to meet with a dietitian?  What number can I call if I have questions?  When are the best times to check my blood glucose? Where to find more information:  American Diabetes  Association: diabetes.org/food-and-fitness/food  Academy of Nutrition and Dietetics: PokerClues.dk  [employer] of Diabetes and Digestive and Kidney Diseases (NIH): ContactWire.be Summary  A healthy meal plan will help you control your blood glucose and maintain a healthy lifestyle.  Working with a diet and nutrition specialist (dietitian) can help you make a meal plan that is best for you.  Keep in mind that carbohydrates and alcohol have immediate effects on your blood glucose levels. It is important to count carbohydrates and to use alcohol carefully. This information is not intended to replace advice given to you by your health care provider. Make sure you discuss any questions you have with your health care provider. Document Released: 10/16/2004 Document Revised: 02/24/2016 Document Reviewed: 02/24/2016 Elsevier Interactive Patient Education  2018 [employer].     IF you received an x-ray today, you will receive an invoice from [facility].  Please contact [facility] at [phone] with questions or concerns regarding your invoice.   IF you received labwork today, you will receive an invoice from [facility]. Please contact [facility] at [phone] with questions or concerns regarding your invoice.   Our billing staff will not be able to assist you with questions regarding bills from these companies.  You will be contacted with the lab results as soon as they are available. The fastest way to get your results is to activate your My Chart account. Instructions are located on the last page of this paperwork. If you have not heard from [place] regarding the results in 2 weeks, please contact this office.

## 2017-02-23 ENCOUNTER — Other Ambulatory Visit: Payer: Self-pay | Admitting: Family Medicine

## 2017-04-22 ENCOUNTER — Encounter: Payer: Self-pay | Admitting: Urgent Care

## 2017-04-22 ENCOUNTER — Ambulatory Visit (INDEPENDENT_AMBULATORY_CARE_PROVIDER_SITE_OTHER): Payer: PRIVATE HEALTH INSURANCE | Admitting: Urgent Care

## 2017-04-22 ENCOUNTER — Other Ambulatory Visit: Payer: Self-pay

## 2017-04-22 VITALS — BP 133/73 | HR 111 | Temp 98.4°F | Resp 18 | Ht 69.5 in | Wt 219.2 lb

## 2017-04-22 DIAGNOSIS — E860 Dehydration: Secondary | ICD-10-CM

## 2017-04-22 DIAGNOSIS — IMO0002 Reserved for concepts with insufficient information to code with codable children: Secondary | ICD-10-CM | POA: Insufficient documentation

## 2017-04-22 DIAGNOSIS — E119 Type 2 diabetes mellitus without complications: Secondary | ICD-10-CM

## 2017-04-22 DIAGNOSIS — Z794 Long term (current) use of insulin: Secondary | ICD-10-CM

## 2017-04-22 DIAGNOSIS — I10 Essential (primary) hypertension: Secondary | ICD-10-CM | POA: Diagnosis not present

## 2017-04-22 DIAGNOSIS — E1165 Type 2 diabetes mellitus with hyperglycemia: Secondary | ICD-10-CM | POA: Insufficient documentation

## 2017-04-22 DIAGNOSIS — Z720 Tobacco use: Secondary | ICD-10-CM

## 2017-04-22 DIAGNOSIS — E782 Mixed hyperlipidemia: Secondary | ICD-10-CM | POA: Diagnosis not present

## 2017-04-22 MED ORDER — LISINOPRIL 10 MG PO TABS: 10.0000 mg | ORAL_TABLET | Freq: Every day | ORAL | 1 refills | Status: DC

## 2017-04-22 MED ORDER — METFORMIN HCL 1000 MG PO TABS: 1000.0000 mg | ORAL_TABLET | Freq: Two times a day (BID) | ORAL | 1 refills | Status: DC

## 2017-04-22 NOTE — Patient Instructions (Addendum)
Diabetes Mellitus and Nutrition When you have diabetes (diabetes mellitus), it is very important to have healthy eating habits because your blood sugar (glucose) levels are greatly affected by what you eat and drink. Eating healthy foods in the appropriate amounts, at about the same times every day, can help you:  Control your blood glucose.  Lower your risk of heart disease.  Improve your blood pressure.  Reach or maintain a healthy weight.  Every person with diabetes is different, and each person has different needs for a meal plan. Your health care provider may recommend that you work with a diet and nutrition specialist (dietitian) to make a meal plan that is best for you. Your meal plan may vary depending on factors such as:  The calories you need.  The medicines you take.  Your weight.  Your blood glucose, blood pressure, and cholesterol levels.  Your activity level.  Other health conditions you have, such as heart or kidney disease.  How do carbohydrates affect me? Carbohydrates affect your blood glucose level more than any other type of food. Eating carbohydrates naturally increases the amount of glucose in your blood. Carbohydrate counting is a method for keeping track of how many carbohydrates you eat. Counting carbohydrates is important to keep your blood glucose at a healthy level, especially if you use insulin or take certain oral diabetes medicines. It is important to know how many carbohydrates you can safely have in each meal. This is different for every person. Your dietitian can help you calculate how many carbohydrates you should have at each meal and for snack. Foods that contain carbohydrates include:  Bread, cereal, rice, pasta, and crackers.  Potatoes and corn.  Peas, beans, and lentils.  Milk and yogurt.  Fruit and juice.  Desserts, such as cakes, cookies, ice cream, and candy.  How does alcohol affect me? Alcohol can cause a sudden decrease in blood  glucose (hypoglycemia), especially if you use insulin or take certain oral diabetes medicines. Hypoglycemia can be a life-threatening condition. Symptoms of hypoglycemia (sleepiness, dizziness, and confusion) are similar to symptoms of having too much alcohol. If your health care provider says that alcohol is safe for you, follow these guidelines:  Limit alcohol intake to no more than 1 drink per day for nonpregnant women and 2 drinks per day for men. One drink equals 12 oz of beer, 5 oz of wine, or 1 oz of hard liquor.  Do not drink on an empty stomach.  Keep yourself hydrated with water, diet soda, or unsweetened iced tea.  Keep in mind that regular soda, juice, and other mixers may contain a lot of sugar and must be counted as carbohydrates.  What are tips for following this plan? Reading food labels  Start by checking the serving size on the label. The amount of calories, carbohydrates, fats, and other nutrients listed on the label are based on one serving of the food. Many foods contain more than one serving per package.  Check the total grams (g) of carbohydrates in one serving. You can calculate the number of servings of carbohydrates in one serving by dividing the total carbohydrates by 15. For example, if a food has 30 g of total carbohydrates, it would be equal to 2 servings of carbohydrates.  Check the number of grams (g) of saturated and trans fats in one serving. Choose foods that have low or no amount of these fats.  Check the number of milligrams (mg) of sodium in one serving. Most people   should limit total sodium intake to less than 2,300 mg per day.  Always check the nutrition information of foods labeled as "low-fat" or "nonfat". These foods may be higher in added sugar or refined carbohydrates and should be avoided.  Talk to your dietitian to identify your daily goals for nutrients listed on the label. Shopping  Avoid buying canned, premade, or processed foods. These  foods tend to be high in fat, sodium, and added sugar.  Shop around the outside edge of the grocery store. This includes fresh fruits and vegetables, bulk grains, fresh meats, and fresh dairy. Cooking  Use low-heat cooking methods, such as baking, instead of high-heat cooking methods like deep frying.  Cook using healthy oils, such as olive, canola, or sunflower oil.  Avoid cooking with butter, cream, or high-fat meats. Meal planning  Eat meals and snacks regularly, preferably at the same times every day. Avoid going long periods of time without eating.  Eat foods high in fiber, such as fresh fruits, vegetables, beans, and whole grains. Talk to your dietitian about how many servings of carbohydrates you can eat at each meal.  Eat 4-6 ounces of lean protein each day, such as lean meat, chicken, fish, eggs, or tofu. 1 ounce is equal to 1 ounce of meat, chicken, or fish, 1 egg, or 1/4 cup of tofu.  Eat some foods each day that contain healthy fats, such as avocado, nuts, seeds, and fish. Lifestyle   Check your blood glucose regularly.  Exercise at least 30 minutes 5 or more days each week, or as told by your health care provider.  Take medicines as told by your health care provider.  Do not use any products that contain nicotine or tobacco, such as cigarettes and e-cigarettes. If you need help quitting, ask your health care provider.  Work with a counselor or diabetes educator to identify strategies to manage stress and any emotional and social challenges. What are some questions to ask my health care provider?  Do I need to meet with a diabetes educator?  Do I need to meet with a dietitian?  What number can I call if I have questions?  When are the best times to check my blood glucose? Where to find more information:  American Diabetes Association: diabetes.org/food-and-fitness/food  Academy of Nutrition and Dietetics:  www.eatright.org/resources/health/diseases-and-conditions/diabetes  National Institute of Diabetes and Digestive and Kidney Diseases (NIH): www.niddk.nih.gov/health-information/diabetes/overview/diet-eating-physical-activity Summary  A healthy meal plan will help you control your blood glucose and maintain a healthy lifestyle.  Working with a diet and nutrition specialist (dietitian) can help you make a meal plan that is best for you.  Keep in mind that carbohydrates and alcohol have immediate effects on your blood glucose levels. It is important to count carbohydrates and to use alcohol carefully. This information is not intended to replace advice given to you by your health care provider. Make sure you discuss any questions you have with your health care provider. Document Released: 10/16/2004 Document Revised: 02/24/2016 Document Reviewed: 02/24/2016 Elsevier Interactive Patient Education  2018 [employer] Inc.     IF you received an x-ray today, you will receive an invoice from [facility]. Please contact [facility] at [phone] with questions or concerns regarding your invoice.   IF you received labwork today, you will receive an invoice from [facility]. Please contact [facility] at [phone] with questions or concerns regarding your invoice.   Our billing staff will not be able to assist you with questions regarding bills from these companies.    You will be contacted with the lab results as soon as they are available. The fastest way to get your results is to activate your My Chart account. Instructions are located on the last page of this paperwork. If you have not heard from us regarding the results in 2 weeks, please contact this office.      

## 2017-04-22 NOTE — Progress Notes (Signed)
   MRN: [id number]  Subjective:   Ian Salazar is a 57 y.o. male who presents for follow up of Type 2 Diabetes Mellitus. Patient is currently managed with metformin. Reports that he is doing better. He is glad to be off of Lantus and has worked hard to make dietary changes. He had recent a1c check at another practice and was at 7.3%. Patient denies blurred vision, polydipsia, chest pain, nausea, vomiting, abdominal pain, hematuria, polyuria, skin infections, numbness or tingling. Patient is checking their feet daily. Denies foot concerns. He is still smoking. Admits that he drank heavily last night, was celebrating as his oldest daughter had her baby.   Known diabetic complications: none  Ian Salazar has a current medication list which includes the following prescription(s): atorvastatin, blood glucose meter kit and supplies, glucose blood, lisinopril, metformin, omeprazole, and onetouch delica lancets 65Y. Also has No Known Allergies.   Ian Salazar  has a past medical history of Abnormal EKG, Anemia, GERD (gastroesophageal reflux disease), adenomatous colonic polyps (03/28/2015), Knee pain, right, Rotator cuff tendonitis, and Type II or unspecified type diabetes mellitus without mention of complication, not stated as uncontrolled. Also  has a past surgical history that includes Multiple tooth extractions.  Objective:   PHYSICAL EXAM BP 133/73   Pulse (!) 111   Temp 98.4 F (36.9 C) (Oral)   Resp 18   Ht 5' 9.5" (1.765 m)   Wt 219 lb 3.2 oz (99.4 kg)   SpO2 97%   BMI 31.91 kg/m   BP Readings from Last 3 Encounters:  04/22/17 133/73  01/21/17 122/70  12/12/16 (!) 147/95    Physical Exam  Constitutional: He is oriented to person, place, and time. He appears well-developed and well-nourished.  HENT:  Mouth/Throat: Oropharynx is clear and moist.  Eyes: Pupils are equal, round, and reactive to light. No scleral icterus.  Cardiovascular: Normal rate, regular rhythm and intact distal pulses.  Exam reveals no gallop and no friction rub.  No murmur heard. Pulmonary/Chest: No respiratory distress. He has no wheezes. He has no rales.  Neurological: He is alert and oriented to person, place, and time.  Skin: Skin is warm and dry.  Psychiatric: He has a normal mood and affect.   Assessment and Plan :   Controlled type 2 diabetes mellitus without complication, without long-term current use of insulin (Ian Salazar) - Plan: Comprehensive metabolic panel, Microalbumin / creatinine urine ratio, Hemoglobin A1c, CANCELED: Lipid panel  Essential hypertension - Plan: lisinopril (PRINIVIL,ZESTRIL) 10 MG tablet, metFORMIN (GLUCOPHAGE) 1000 MG tablet  Mixed hyperlipidemia  Dehydration  Tobacco use  Improved, labs pending. Maintain Metformin. Refilled metformin and lisinopril. Encouraged patient to continue practicing healthy diet. Counseled on risks on alcohol use and smoking. Follow up in 6 months.  Jaynee Eagles, PA-C [facility] [phone] 04/22/2017 8:22 AM

## 2017-07-14 ENCOUNTER — Other Ambulatory Visit: Payer: Self-pay | Admitting: Urgent Care

## 2017-10-28 ENCOUNTER — Encounter: Payer: Self-pay | Admitting: Urgent Care

## 2017-10-28 ENCOUNTER — Other Ambulatory Visit: Payer: Self-pay

## 2017-10-28 ENCOUNTER — Ambulatory Visit (INDEPENDENT_AMBULATORY_CARE_PROVIDER_SITE_OTHER): Payer: PRIVATE HEALTH INSURANCE | Admitting: Urgent Care

## 2017-10-28 VITALS — BP 134/82 | HR 100 | Temp 98.3°F | Resp 18 | Ht 69.5 in | Wt 223.4 lb

## 2017-10-28 DIAGNOSIS — E782 Mixed hyperlipidemia: Secondary | ICD-10-CM | POA: Diagnosis not present

## 2017-10-28 DIAGNOSIS — Z720 Tobacco use: Secondary | ICD-10-CM

## 2017-10-28 DIAGNOSIS — E119 Type 2 diabetes mellitus without complications: Secondary | ICD-10-CM

## 2017-10-28 DIAGNOSIS — I10 Essential (primary) hypertension: Secondary | ICD-10-CM

## 2017-10-28 MED ORDER — LISINOPRIL 10 MG PO TABS: 10.0000 mg | ORAL_TABLET | Freq: Every day | ORAL | 3 refills | Status: DC

## 2017-10-28 MED ORDER — OMEPRAZOLE 20 MG PO CPDR: DELAYED_RELEASE_CAPSULE | ORAL | 3 refills | Status: DC

## 2017-10-28 MED ORDER — ATORVASTATIN CALCIUM 20 MG PO TABS: 20.0000 mg | ORAL_TABLET | Freq: Every day | ORAL | 3 refills | Status: DC

## 2017-10-28 MED ORDER — METFORMIN HCL 1000 MG PO TABS: 1000.0000 mg | ORAL_TABLET | Freq: Two times a day (BID) | ORAL | 3 refills | Status: DC

## 2017-10-28 NOTE — Patient Instructions (Addendum)
At PCP, you can work with Dr. Carlota Raspberry or Dr. Mitchel Honour on your healthcare.    [facility] [address] [place], [place] [address]  [phone] I'll be starting in October 2019.     If you have lab work done today you will be contacted with your lab results within the next 2 weeks.  If you have not heard from [place] then please contact us. The fastest way to get your results is to register for My Chart.     Smoking Tobacco Information Smoking tobacco will very likely harm your health. Tobacco contains a poisonous (toxic), colorless chemical called nicotine. Nicotine affects the brain and makes tobacco addictive. This change in your brain can make it hard to stop smoking. Tobacco also has other toxic chemicals that can hurt your body and raise your risk of many cancers. How can smoking tobacco affect me? Smoking tobacco can increase your chances of having serious health conditions, such as:  Cancer. Smoking is most commonly associated with lung cancer, but can lead to cancer in other parts of the body.  Chronic obstructive pulmonary disease (COPD). This is a long-term lung condition that makes it hard to breathe. It also gets worse over time.  High blood pressure (hypertension), heart disease, stroke, or heart attack.  Lung infections, such as pneumonia.  Cataracts. This is when the lenses in the eyes become clouded.  Digestive problems. This may include peptic ulcers, heartburn, and gastroesophageal reflux disease (GERD).  Oral health problems, such as gum disease and tooth loss.  Loss of taste and smell.  Smoking can affect your appearance by causing:  Wrinkles.  Yellow or stained teeth, fingers, and fingernails.  Smoking tobacco can also affect your social life.  Many workplaces, [employer], hotels, and public places are tobacco-free. This means that you may experience challenges in finding places to smoke when away from home.  The cost of a  smoking habit can be expensive. Expenses for someone who smokes come in two ways: ? You spend money on a regular basis to buy tobacco. ? Your health care costs in the long-term are higher if you smoke.  Tobacco smoke can also affect the health of those around you. Children of smokers have greater chances of: ? Sudden infant death syndrome (SIDS). ? Ear infections. ? Lung infections.  What lifestyle changes can be made?  Do not start smoking. Quit if you already do.  To quit smoking: ? Make a plan to quit smoking and commit yourself to it. Look for programs to help you and ask your health care provider for recommendations and ideas. ? Talk with your health care provider about using nicotine replacement medicines to help you quit. Medicine replacement medicines include gum, lozenges, patches, sprays, or pills. ? Do not replace cigarette smoking with electronic cigarettes, which are commonly called e-cigarettes. The safety of e-cigarettes is not known, and some may contain harmful chemicals. ? Avoid places, people, or situations that tempt you to smoke. ? If you try to quit but return to smoking, don't give up hope. It is very common for people to try a number of times before they fully succeed. When you feel ready again, give it another try.  Quitting smoking might affect the way you eat as well as your weight. Be prepared to monitor your eating habits. Get support in planning and following a healthy diet.  Ask your health care provider about having regular tests (screenings) to check for cancer. This  may include blood tests, imaging tests, and other tests.  Exercise regularly. Consider taking walks, joining a gym, or doing yoga or exercise classes.  Develop skills to manage your stress. These skills include meditation. What are the benefits of quitting smoking? By quitting smoking, you may:  Lower your risk of getting cancer and other diseases caused by smoking.  Live  longer.  Breathe better.  Lower your blood pressure and heart rate.  Stop your addiction to tobacco.  Stop creating secondhand smoke that hurts other people.  Improve your sense of taste and smell.  Look better over time, due to having fewer wrinkles and less staining.  What can happen if changes are not made? If you do not stop smoking, you may:  Get cancer and other diseases.  Develop COPD or other long-term (chronic) lung conditions.  Develop serious problems with your heart and blood vessels (cardiovascular system).  Need more tests to screen for problems caused by smoking.  Have higher, long-term healthcare costs from medicines or treatments related to smoking.  Continue to have worsening changes in your lungs, mouth, and nose.  Where to find support: To get support to quit smoking, consider:  Asking your health care provider for more information and resources.  Taking classes to learn more about quitting smoking.  Looking for local organizations that offer resources about quitting smoking.  Joining a support group for people who want to quit smoking in your local community.  Where to find more information: You may find more information about quitting smoking from:  HelpGuide.org: www.helpguide.org/articles/addictions/how-to-quit-smoking.htm  https://hall.com/: smokefree.gov  American Lung Association: www.lung.org  Contact a health care provider if:  You have problems breathing.  Your lips, nose, or fingers turn blue.  You have chest pain.  You are coughing up blood.  You feel faint or you pass out.  You have other noticeable changes that cause you to worry. Summary  Smoking tobacco can negatively affect your health, the health of those around you, your finances, and your social life.  Do not start smoking. Quit if you already do. If you need help quitting, ask your health care provider.  Think about joining a support group for people who want to  quit smoking in your local community. There are many effective programs that will help you to quit this behavior. This information is not intended to replace advice given to you by your health care provider. Make sure you discuss any questions you have with your health care provider. Document Released: 02/04/2016 Document Revised: 02/04/2016 Document Reviewed: 02/04/2016 Elsevier Interactive Patient Education  2018 [employer].      IF you received an x-ray today, you will receive an invoice from [facility]. Please contact [facility] at [phone] with questions or concerns regarding your invoice.   IF you received labwork today, you will receive an invoice from [place]. Please contact [facility] at [phone] with questions or concerns regarding your invoice.   Our billing staff will not be able to assist you with questions regarding bills from these companies.  You will be contacted with the lab results as soon as they are available. The fastest way to get your results is to activate your My Chart account. Instructions are located on the last page of this paperwork. If you have not heard from [place] regarding the results in 2 weeks, please contact this office.

## 2017-10-28 NOTE — Progress Notes (Signed)
    MRN: [id number] DOB: 27-Jan-1961  Subjective:   Ian Salazar is a 57 y.o. male presenting for follow up on DM.  At his last office visit, patient had reported an A1c of 7.3% with a different practice.  This was back in April 22, 2017 and his point-of-care glucose was consistent with his A1c.  Today, patient reports for follow-up.  He reports that he had his a1c checked again and was 6.5%. HDL was at 52, LDL 72, total cholesterol was 137.  Started smoking again, less than 1/2ppd. Denies dizziness, chronic headache, blurred vision, chest pain, shortness of breath, heart racing, palpitations, nausea, vomiting, abdominal pain, hematuria, lower leg swelling.   Ryker has a current medication list which includes the following prescription(s): blood glucose meter kit and supplies, glucose blood, lisinopril, metformin, omeprazole, onetouch delica lancets 09B, and atorvastatin. Also has No Known Allergies.  [name]  has a past medical history of Abnormal EKG, Anemia, GERD (gastroesophageal reflux disease), adenomatous colonic polyps (03/28/2015), Knee pain, right, Rotator cuff tendonitis, and Type II or unspecified type diabetes mellitus without mention of complication, not stated as uncontrolled. Also  has a past surgical history that includes Multiple tooth extractions.  Objective:   Vitals: BP 134/82   Pulse 100   Temp 98.3 F (36.8 C) (Oral)   Resp 18   Ht 5' 9.5" (1.765 m)   Wt 223 lb 6.4 oz (101.3 kg)   SpO2 98%   BMI 32.52 kg/m   BP Readings from Last 3 Encounters:  10/28/17 134/82  04/22/17 133/73  01/21/17 122/70   Wt Readings from Last 3 Encounters:  10/28/17 223 lb 6.4 oz (101.3 kg)  04/22/17 219 lb 3.2 oz (99.4 kg)  01/21/17 225 lb (102.1 kg)   Physical Exam  Constitutional: He is oriented to person, place, and time. He appears well-developed and well-nourished.  Cardiovascular: Normal rate.  Pulmonary/Chest: Effort normal.  Neurological: He is alert and oriented to person,  place, and time.   Assessment and Plan :   Controlled type 2 diabetes mellitus without complication, without long-term current use of insulin (HCC)  Essential hypertension - Plan: lisinopril (PRINIVIL,ZESTRIL) 10 MG tablet, metFORMIN (GLUCOPHAGE) 1000 MG tablet  Mixed hyperlipidemia  Tobacco use  Patient is doing very well, encourage continued healthy diet.  Also emphasized need for quitting tobacco use.  Patient is in agreement and will try to quit on his own.  Refills provided for up to 1 year.  Follow-up due at that point.  Jaynee Eagles, PA-C [facility] [phone] 10/28/2017  8:49 AM

## 2018-06-08 ENCOUNTER — Encounter: Payer: Self-pay | Admitting: Family Medicine

## 2018-06-08 ENCOUNTER — Other Ambulatory Visit: Payer: Self-pay

## 2018-06-08 ENCOUNTER — Ambulatory Visit (INDEPENDENT_AMBULATORY_CARE_PROVIDER_SITE_OTHER): Payer: PRIVATE HEALTH INSURANCE | Admitting: Family Medicine

## 2018-06-08 DIAGNOSIS — H6123 Impacted cerumen, bilateral: Secondary | ICD-10-CM | POA: Diagnosis not present

## 2018-06-08 NOTE — Patient Instructions (Addendum)
Thank you for coming in today. Ears look good after procedure.  Return to the clinic or go to the nearest emergency room if any of your symptoms worsen or new symptoms occur.    Earwax Buildup, Adult The ears produce a substance called earwax that helps keep bacteria out of the ear and protects the skin in the ear canal. Occasionally, earwax can build up in the ear and cause discomfort or hearing loss. What increases the risk? This condition is more likely to develop in people who:  Are male.  Are elderly.  Naturally produce more earwax.  Clean their ears often with cotton swabs.  Use earplugs often.  Use in-ear headphones often.  Wear hearing aids.  Have narrow ear canals.  Have earwax that is overly thick or sticky.  Have eczema.  Are dehydrated.  Have excess hair in the ear canal. What are the signs or symptoms? Symptoms of this condition include:  Reduced or muffled hearing.  A feeling of fullness in the ear or feeling that the ear is plugged.  Fluid coming from the ear.  Ear pain.  Ear itch.  Ringing in the ear.  Coughing.  An obvious piece of earwax that can be seen inside the ear canal. How is this diagnosed? This condition may be diagnosed based on:  Your symptoms.  Your medical history.  An ear exam. During the exam, your health care provider will look into your ear with an instrument called an otoscope. You may have tests, including a hearing test. How is this treated? This condition may be treated by:  Using ear drops to soften the earwax.  Having the earwax removed by a health care provider. The health care provider may: ? Flush the ear with water. ? Use an instrument that has a loop on the end (curette). ? Use a suction device.  Surgery to remove the wax buildup. This may be done in severe cases. Follow these instructions at home:   Take over-the-counter and prescription medicines only as told by your health care  provider.  Do not put any objects, including cotton swabs, into your ear. You can clean the opening of your ear canal with a washcloth or facial tissue.  Follow instructions from your health care provider about cleaning your ears. Do not over-clean your ears.  Drink enough fluid to keep your urine clear or pale yellow. This will help to thin the earwax.  Keep all follow-up visits as told by your health care provider. If earwax builds up in your ears often or if you use hearing aids, consider seeing your health care provider for routine, preventive ear cleanings. Ask your health care provider how often you should schedule your cleanings.  If you have hearing aids, clean them according to instructions from the manufacturer and your health care provider. Contact a health care provider if:  You have ear pain.  You develop a fever.  You have blood, pus, or other fluid coming from your ear.  You have hearing loss.  You have ringing in your ears that does not go away.  Your symptoms do not improve with treatment.  You feel like the room is spinning (vertigo). Summary  Earwax can build up in the ear and cause discomfort or hearing loss.  The most common symptoms of this condition include reduced or muffled hearing and a feeling of fullness in the ear or feeling that the ear is plugged.  This condition may be diagnosed based on your symptoms,  your medical history, and an ear exam.  This condition may be treated by using ear drops to soften the earwax or by having the earwax removed by a health care provider.  Do not put any objects, including cotton swabs, into your ear. You can clean the opening of your ear canal with a washcloth or facial tissue. This information is not intended to replace advice given to you by your health care provider. Make sure you discuss any questions you have with your health care provider. Document Released: 02/27/2004 Document Revised: 12/31/2016 Document  Reviewed: 04/01/2016 Elsevier Interactive Patient Education  2019 [employer], Adult The ears produce a substance called earwax that helps keep bacteria out of the ear and protects the skin in the ear canal. Occasionally, earwax can build up in the ear and cause discomfort or hearing loss. What increases the risk? This condition is more likely to develop in people who:  Are male.  Are elderly.  Naturally produce more earwax.  Clean their ears often with cotton swabs.  Use earplugs often.  Use in-ear headphones often.  Wear hearing aids.  Have narrow ear canals.  Have earwax that is overly thick or sticky.  Have eczema.  Are dehydrated.  Have excess hair in the ear canal. What are the signs or symptoms? Symptoms of this condition include:  Reduced or muffled hearing.  A feeling of fullness in the ear or feeling that the ear is plugged.  Fluid coming from the ear.  Ear pain.  Ear itch.  Ringing in the ear.  Coughing.  An obvious piece of earwax that can be seen inside the ear canal. How is this diagnosed? This condition may be diagnosed based on:  Your symptoms.  Your medical history.  An ear exam. During the exam, your health care provider will look into your ear with an instrument called an otoscope. You may have tests, including a hearing test. How is this treated? This condition may be treated by:  Using ear drops to soften the earwax.  Having the earwax removed by a health care provider. The health care provider may: ? Flush the ear with water. ? Use an instrument that has a loop on the end (curette). ? Use a suction device.  Surgery to remove the wax buildup. This may be done in severe cases. Follow these instructions at home:   Take over-the-counter and prescription medicines only as told by your health care provider.  Do not put any objects, including cotton swabs, into your ear. You can clean the opening of your ear  canal with a washcloth or facial tissue.  Follow instructions from your health care provider about cleaning your ears. Do not over-clean your ears.  Drink enough fluid to keep your urine clear or pale yellow. This will help to thin the earwax.  Keep all follow-up visits as told by your health care provider. If earwax builds up in your ears often or if you use hearing aids, consider seeing your health care provider for routine, preventive ear cleanings. Ask your health care provider how often you should schedule your cleanings.  If you have hearing aids, clean them according to instructions from the manufacturer and your health care provider. Contact a health care provider if:  You have ear pain.  You develop a fever.  You have blood, pus, or other fluid coming from your ear.  You have hearing loss.  You have ringing in your ears that does not go away.  Your symptoms do not improve with treatment.  You feel like the room is spinning (vertigo). Summary  Earwax can build up in the ear and cause discomfort or hearing loss.  The most common symptoms of this condition include reduced or muffled hearing and a feeling of fullness in the ear or feeling that the ear is plugged.  This condition may be diagnosed based on your symptoms, your medical history, and an ear exam.  This condition may be treated by using ear drops to soften the earwax or by having the earwax removed by a health care provider.  Do not put any objects, including cotton swabs, into your ear. You can clean the opening of your ear canal with a washcloth or facial tissue. This information is not intended to replace advice given to you by your health care provider. Make sure you discuss any questions you have with your health care provider. Document Released: 02/27/2004 Document Revised: 12/31/2016 Document Reviewed: 04/01/2016 Elsevier Interactive Patient Education  [employer].   If you have lab work done today  you will be contacted with your lab results within the next 2 weeks.  If you have not heard from [place] then please contact us. The fastest way to get your results is to register for My Chart.   IF you received an x-ray today, you will receive an invoice from [facility] Radiology. Please contact [facility] at [phone] with questions or concerns regarding your invoice.   IF you received labwork today, you will receive an invoice from [place]. Please contact [facility] at [phone] with questions or concerns regarding your invoice.   Our billing staff will not be able to assist you with questions regarding bills from these companies.  You will be contacted with the lab results as soon as they are available. The fastest way to get your results is to activate your My Chart account. Instructions are located on the last page of this paperwork. If you have not heard from [place] regarding the results in 2 weeks, please contact this office.

## 2018-06-08 NOTE — Progress Notes (Signed)
Subjective:    Patient ID: Ian Salazar, male    DOB: [birth year], 58 y.o.   MRN: [id number]  HPI  Ian Salazar is a 58 y.o. male Presents today for: Chief Complaint  Patient presents with  . Cerumen Impaction    left ear has been stopped up for 2-3 days now. Tried to get out but have no luck.   L ear stopped up 3 days.  otc tx: debrox - no relief.  No recent nasal congestion No ear pain. No fever.no ear rash.   SH:  [employer].   Per HPI.  Review of Systems     Objective:   Physical Exam Constitutional:      General: He is not in acute distress.    Appearance: He is well-developed.  HENT:     Head: Normocephalic and atraumatic.     Right Ear: External ear normal. No swelling or tenderness. There is impacted cerumen.     Left Ear: External ear normal. Decreased hearing noted. No swelling or tenderness. There is impacted cerumen.  Cardiovascular:     Rate and Rhythm: Normal rate.  Pulmonary:     Effort: Pulmonary effort is normal.  Neurological:     Mental Status: He is alert and oriented to person, place, and time.    Vitals:   06/08/18 1548  BP: 108/68  Pulse: 87  Resp: 16  Temp: 97.8 F (36.6 C)  TempSrc: Oral  SpO2: 99%  Weight: 227 lb 3.2 oz (103.1 kg)  Height: 5' 9.5" (1.765 m)   4:10 PM Impacted cerumen left and right side.  He is still able to hear some out of the right ear.  Unable to visualize TM either ear.  Small amount of cerumen with dry skin removed with alligator forceps at the distal left canal.  Lavage performed by medical assistant  4:49 PM Exam after lavage - canals clear, no effusion, no pain, and hearing well.      Assessment & Plan:   [name] is a 58 y.o. male Bilateral impacted cerumen  - lavage performed after small amount removed manually with forceps. Clearing of both canals, tolerated without difficulty. rtc precautions given.   No orders of the defined types were placed in this encounter.  Patient  Instructions     Thank you for coming in today. Ears look good after procedure.  Return to the clinic or go to the nearest emergency room if any of your symptoms worsen or new symptoms occur.    Earwax Buildup, Adult The ears produce a substance called earwax that helps keep bacteria out of the ear and protects the skin in the ear canal. Occasionally, earwax can build up in the ear and cause discomfort or hearing loss. What increases the risk? This condition is more likely to develop in people who:  Are male.  Are elderly.  Naturally produce more earwax.  Clean their ears often with cotton swabs.  Use earplugs often.  Use in-ear headphones often.  Wear hearing aids.  Have narrow ear canals.  Have earwax that is overly thick or sticky.  Have eczema.  Are dehydrated.  Have excess hair in the ear canal. What are the signs or symptoms? Symptoms of this condition include:  Reduced or muffled hearing.  A feeling of fullness in the ear or feeling that the ear is plugged.  Fluid coming from the ear.  Ear pain.  Ear itch.  Ringing in the ear.  Coughing.  An obvious piece of earwax that can be seen inside the ear canal. How is this diagnosed? This condition may be diagnosed based on:  Your symptoms.  Your medical history.  An ear exam. During the exam, your health care provider will look into your ear with an instrument called an otoscope. You may have tests, including a hearing test. How is this treated? This condition may be treated by:  Using ear drops to soften the earwax.  Having the earwax removed by a health care provider. The health care provider may: ? Flush the ear with water. ? Use an instrument that has a loop on the end (curette). ? Use a suction device.  Surgery to remove the wax buildup. This may be done in severe cases. Follow these instructions at home:   Take over-the-counter and prescription medicines only as told by your health  care provider.  Do not put any objects, including cotton swabs, into your ear. You can clean the opening of your ear canal with a washcloth or facial tissue.  Follow instructions from your health care provider about cleaning your ears. Do not over-clean your ears.  Drink enough fluid to keep your urine clear or pale yellow. This will help to thin the earwax.  Keep all follow-up visits as told by your health care provider. If earwax builds up in your ears often or if you use hearing aids, consider seeing your health care provider for routine, preventive ear cleanings. Ask your health care provider how often you should schedule your cleanings.  If you have hearing aids, clean them according to instructions from the manufacturer and your health care provider. Contact a health care provider if:  You have ear pain.  You develop a fever.  You have blood, pus, or other fluid coming from your ear.  You have hearing loss.  You have ringing in your ears that does not go away.  Your symptoms do not improve with treatment.  You feel like the room is spinning (vertigo). Summary  Earwax can build up in the ear and cause discomfort or hearing loss.  The most common symptoms of this condition include reduced or muffled hearing and a feeling of fullness in the ear or feeling that the ear is plugged.  This condition may be diagnosed based on your symptoms, your medical history, and an ear exam.  This condition may be treated by using ear drops to soften the earwax or by having the earwax removed by a health care provider.  Do not put any objects, including cotton swabs, into your ear. You can clean the opening of your ear canal with a washcloth or facial tissue. This information is not intended to replace advice given to you by your health care provider. Make sure you discuss any questions you have with your health care provider. Document Released: 02/27/2004 Document Revised: 12/31/2016 Document  Reviewed: 04/01/2016 Elsevier Interactive Patient Education  2019 [place], Adult The ears produce a substance called earwax that helps keep bacteria out of the ear and protects the skin in the ear canal. Occasionally, earwax can build up in the ear and cause discomfort or hearing loss. What increases the risk? This condition is more likely to develop in people who:  Are male.  Are elderly.  Naturally produce more earwax.  Clean their ears often with cotton swabs.  Use earplugs often.  Use in-ear headphones often.  Wear hearing aids.  Have narrow ear canals.  Have earwax that  is overly thick or sticky.  Have eczema.  Are dehydrated.  Have excess hair in the ear canal. What are the signs or symptoms? Symptoms of this condition include:  Reduced or muffled hearing.  A feeling of fullness in the ear or feeling that the ear is plugged.  Fluid coming from the ear.  Ear pain.  Ear itch.  Ringing in the ear.  Coughing.  An obvious piece of earwax that can be seen inside the ear canal. How is this diagnosed? This condition may be diagnosed based on:  Your symptoms.  Your medical history.  An ear exam. During the exam, your health care provider will look into your ear with an instrument called an otoscope. You may have tests, including a hearing test. How is this treated? This condition may be treated by:  Using ear drops to soften the earwax.  Having the earwax removed by a health care provider. The health care provider may: ? Flush the ear with water. ? Use an instrument that has a loop on the end (curette). ? Use a suction device.  Surgery to remove the wax buildup. This may be done in severe cases. Follow these instructions at home:   Take over-the-counter and prescription medicines only as told by your health care provider.  Do not put any objects, including cotton swabs, into your ear. You can clean the opening of your ear  canal with a washcloth or facial tissue.  Follow instructions from your health care provider about cleaning your ears. Do not over-clean your ears.  Drink enough fluid to keep your urine clear or pale yellow. This will help to thin the earwax.  Keep all follow-up visits as told by your health care provider. If earwax builds up in your ears often or if you use hearing aids, consider seeing your health care provider for routine, preventive ear cleanings. Ask your health care provider how often you should schedule your cleanings.  If you have hearing aids, clean them according to instructions from the manufacturer and your health care provider. Contact a health care provider if:  You have ear pain.  You develop a fever.  You have blood, pus, or other fluid coming from your ear.  You have hearing loss.  You have ringing in your ears that does not go away.  Your symptoms do not improve with treatment.  You feel like the room is spinning (vertigo). Summary  Earwax can build up in the ear and cause discomfort or hearing loss.  The most common symptoms of this condition include reduced or muffled hearing and a feeling of fullness in the ear or feeling that the ear is plugged.  This condition may be diagnosed based on your symptoms, your medical history, and an ear exam.  This condition may be treated by using ear drops to soften the earwax or by having the earwax removed by a health care provider.  Do not put any objects, including cotton swabs, into your ear. You can clean the opening of your ear canal with a washcloth or facial tissue. This information is not intended to replace advice given to you by your health care provider. Make sure you discuss any questions you have with your health care provider. Document Released: 02/27/2004 Document Revised: 12/31/2016 Document Reviewed: 04/01/2016 Elsevier Interactive Patient Education  [employer].   If you have lab work done today  you will be contacted with your lab results within the next 2 weeks.  If you have  not heard from [place] then please contact us. The fastest way to get your results is to register for My Chart.   IF you received an x-ray today, you will receive an invoice from [facility]. Please contact [facility] at [phone] with questions or concerns regarding your invoice.   IF you received labwork today, you will receive an invoice from [facility]. Please contact [facility] at [phone] with questions or concerns regarding your invoice.   Our billing staff will not be able to assist you with questions regarding bills from these companies.  You will be contacted with the lab results as soon as they are available. The fastest way to get your results is to activate your My Chart account. Instructions are located on the last page of this paperwork. If you have not heard from [place] regarding the results in 2 weeks, please contact this office.       Signed,   Merri Ray, MD [facility].  06/08/18 4:50 PM

## 2018-07-08 ENCOUNTER — Ambulatory Visit: Payer: PRIVATE HEALTH INSURANCE | Admitting: Family Medicine

## 2018-07-08 ENCOUNTER — Encounter: Payer: Self-pay | Admitting: Family Medicine

## 2018-07-08 ENCOUNTER — Other Ambulatory Visit: Payer: Self-pay

## 2018-07-08 VITALS — BP 112/68 | HR 94 | Temp 98.2°F | Resp 14 | Ht 69.0 in | Wt 228.0 lb

## 2018-07-08 DIAGNOSIS — M545 Low back pain, unspecified: Secondary | ICD-10-CM

## 2018-07-08 DIAGNOSIS — F17210 Nicotine dependence, cigarettes, uncomplicated: Secondary | ICD-10-CM

## 2018-07-08 DIAGNOSIS — Z23 Encounter for immunization: Secondary | ICD-10-CM | POA: Diagnosis not present

## 2018-07-08 DIAGNOSIS — E119 Type 2 diabetes mellitus without complications: Secondary | ICD-10-CM | POA: Diagnosis not present

## 2018-07-08 DIAGNOSIS — Z716 Tobacco abuse counseling: Secondary | ICD-10-CM

## 2018-07-08 DIAGNOSIS — E785 Hyperlipidemia, unspecified: Secondary | ICD-10-CM | POA: Diagnosis not present

## 2018-07-08 LAB — COMPREHENSIVE METABOLIC PANEL
ALT: 14 IU/L (ref 0–44)
AST: 15 IU/L (ref 0–40)
Albumin/Globulin Ratio: 1.9 (ref 1.2–2.2)
Albumin: 4.5 g/dL (ref 3.8–4.9)
Alkaline Phosphatase: 63 IU/L (ref 39–117)
BUN/Creatinine Ratio: 13 (ref 9–20)
BUN: 13 mg/dL (ref 6–24)
Bilirubin Total: 0.2 mg/dL (ref 0.0–1.2)
CO2: 22 mmol/L (ref 20–29)
Calcium: 9.7 mg/dL (ref 8.7–10.2)
Chloride: 103 mmol/L (ref 96–106)
Creatinine, Ser: 0.99 mg/dL (ref 0.76–1.27)
GFR calc Af Amer: 97 mL/min/{1.73_m2} (ref >59)
GFR calc non Af Amer: 84 mL/min/{1.73_m2} (ref >59)
Globulin, Total: 2.4 g/dL (ref 1.5–4.5)
Glucose: 165 mg/dL — ABNORMAL HIGH (ref 65–99)
Potassium: 3.9 mmol/L (ref 3.5–5.2)
Sodium: 140 mmol/L (ref 134–144)
Total Protein: 6.9 g/dL (ref 6.0–8.5)

## 2018-07-08 LAB — POCT GLYCOSYLATED HEMOGLOBIN (HGB A1C): Hemoglobin A1C: 7.8 % — AB (ref 4.0–5.6)

## 2018-07-08 LAB — LIPID PANEL
Chol/HDL Ratio: 2.2 ratio (ref 0.0–5.0)
Cholesterol, Total: 113 mg/dL (ref 100–199)
HDL: 52 mg/dL (ref >39)
LDL Calculated: 52 mg/dL (ref 0–99)
Triglycerides: 44 mg/dL (ref 0–149)
VLDL Cholesterol Cal: 9 mg/dL (ref 5–40)

## 2018-07-08 LAB — POCT URINALYSIS DIP (MANUAL ENTRY)
Bilirubin, UA: NEGATIVE
Blood, UA: NEGATIVE
Glucose, UA: NEGATIVE mg/dL
Ketones, POC UA: NEGATIVE mg/dL
Leukocytes, UA: NEGATIVE
Nitrite, UA: NEGATIVE
Protein Ur, POC: NEGATIVE mg/dL
Spec Grav, UA: 1.02 (ref 1.010–1.025)
Urobilinogen, UA: 1 E.U./dL
pH, UA: 7 (ref 5.0–8.0)

## 2018-07-08 LAB — GLUCOSE, POCT (MANUAL RESULT ENTRY): POC Glucose: 176 mg/dl — AB (ref 70–99)

## 2018-07-08 MED ORDER — GLUCOSE BLOOD VI STRP: ORAL_STRIP | 11 refills | Status: DC

## 2018-07-08 MED ORDER — MELOXICAM 7.5 MG PO TABS: 7.5000 mg | ORAL_TABLET | Freq: Every day | ORAL | 0 refills | Status: DC | PRN

## 2018-07-08 MED ORDER — VARENICLINE TARTRATE 0.5 MG X 11 & 1 MG X 42 PO MISC: ORAL | 0 refills | Status: DC

## 2018-07-08 MED ORDER — VARENICLINE TARTRATE 1 MG PO TABS: 1.0000 mg | ORAL_TABLET | Freq: Two times a day (BID) | ORAL | 2 refills | Status: DC

## 2018-07-08 NOTE — Progress Notes (Signed)
Subjective:    Patient ID: Ian Salazar, male    DOB: [birth year], 58 y.o.   MRN: [id number]  HPI Ian Salazar is a 58 y.o. male Presents today for: Chief Complaint  Patient presents with  . Back Pain    low back pain on both side right side worse. Pain comes and go. Can feel it when bend down to try to pick something up. This has been going on for 3 weeks now. Denies any urinary issus. Patient stated feels like it muscle relarted  . Diabetes    Need a refill on test strips. Patiernt is past due on diabetes exam he stated he has an appt in Sept was not sure if you wanted to tackle this today or not   Low Back Pain: Notes with bending down and sitting up, feels pressure feeling in low back both sides.  Present for 3 weeks.  No prior back surgery/injection.  No sleep difficulty.  No bowel or bladder incontinence, no saddle anesthesia, no lower extremity weakness.  No urinary symptoms.  Tx: occasional ibuprofen 87m - 1 per day for past few weeks.   Diabetes:  No recent testing in office, but did report an A1c of 6.5 at different practice when discussed last year with MJaynee Eagles  Had also reported an HDL of 52, LDL 72, total cholesterol 137. Started smoking back and 1/2 pack/day at last visit in September, 2019. Continued on metformin 1000 mg twice daily. No new side effects.  Home readings 160-168- fasting.  Lisinopril 10 mg as ACE inhibitor, Lipitor 20 mg as statin. Microalbumin: Overdue for testing Optho, foot exam, pneumovax: Overdue for ophthalmology exam June last year - scheduling).and pneumovax today. .  Foot exam today.  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  Diabetic Foot Exam - Simple   No data filed       Lab Results  Component Value Date   HGBA1C 11.4 12/12/2016   HGBA1C 9.4 01/14/2015   HGBA1C 7.1 (H) 07/03/2011   Lab Results  Component Value Date   LDLCALC 100 (H) 12/12/2016   CREATININE 0.87 12/12/2016   Tobacco abuse.  1/2 ppd. Quit  for 1 month. Ready to quit again. Cold [place] past for 30 days. Would like try Chantix (no side effects when taking prior - only took 3 days).  4 min conversation.   Hyperlipidemia:  Lab Results  Component Value Date   CHOL 190 12/12/2016   HDL 62 12/12/2016   LDLCALC 100 (H) 12/12/2016   TRIG 140 12/12/2016   CHOLHDL 3.1 12/12/2016   Lab Results  Component Value Date   ALT 39 12/12/2016   AST 31 12/12/2016   ALKPHOS 71 12/12/2016   BILITOT <0.2 12/12/2016  lipitor 232mqd. No new myalgias, only back pain as above recently.   Patient Active Problem List   Diagnosis Date Noted  . Uncontrolled type 2 diabetes mellitus with insulin therapy ([place]03/21/2019  . Hx of adenomatous colonic polyps 03/28/2015  . Routine general medical examination at a health care facility 07/17/2011  . Mixed hyperlipidemia 07/17/2011  . Abnormal chest x-ray 07/17/2011  . Vitamin B 12 deficiency 04/22/2010  . ANEMIA-NOS 04/18/2010  . TOBACCO USE 04/18/2010  . DEGENERATIVE JOINT DISEASE, KNEE 10/24/2008  . DM w/o complication type II [id number] Past Medical History:  Diagnosis Date  . Abnormal EKG    Normal ETT 5 years ago  . Anemia   . GERD (gastroesophageal reflux disease)  occasional  . Hx of adenomatous colonic polyps 03/28/2015  . Knee pain, right   . Rotator cuff tendonitis    Left  . Type II or unspecified type diabetes mellitus without mention of complication, not stated as uncontrolled    Past Surgical History:  Procedure Laterality Date  . MULTIPLE TOOTH EXTRACTIONS     No Known Allergies Prior to Admission medications   Medication Sig Start Date End Date Taking? Authorizing Provider  atorvastatin (LIPITOR) 20 MG tablet Take 1 tablet (20 mg total) by mouth daily. 10/28/17  Yes Jaynee Eagles, PA-C  blood glucose meter kit and supplies KIT Dispense based on patient and insurance preference. Check your fasting blood sugars daily. (FOR ICD-9 250.00, 250.01). 12/12/16  Yes Jaynee Eagles, PA-C  glucose blood (ONE TOUCH ULTRA TEST) test strip USE TO TEST FASTING BLOOD SUGARS DAILY 02/23/17  Yes Jaynee Eagles, PA-C  lisinopril (PRINIVIL,ZESTRIL) 10 MG tablet Take 1 tablet (10 mg total) by mouth daily. 10/28/17  Yes Jaynee Eagles, PA-C  metFORMIN (GLUCOPHAGE) 1000 MG tablet Take 1 tablet (1,000 mg total) by mouth 2 (two) times daily with a meal. 10/28/17  Yes Jaynee Eagles, PA-C  omeprazole (PRILOSEC) 20 MG capsule TAKE 1 CAPSULE(20 MG) BY MOUTH DAILY 10/28/17  Yes Jaynee Eagles, PA-C  [facility] DELICA LANCETS 03Y MISC USE TO TEST FASTING BLOOD SUGARS DAILY 02/23/17  Yes Jaynee Eagles, PA-C   Social History   Socioeconomic History  . Marital status: Married    Spouse name: Not on file  . Number of children: Not on file  . Years of education: Not on file  . Highest education level: Not on file  Occupational History  . Occupation: [employer]  . Financial resource strain: Not on file  . Food insecurity:    Worry: Not on file    Inability: Not on file  . Transportation needs:    Medical: Not on file    Non-medical: Not on file  Tobacco Use  . Smoking status: Current Every Day Smoker    Packs/day: 0.50    Years: 13.00    Pack years: 6.50    Types: Cigarettes  . Smokeless tobacco: Never Used  Substance and Sexual Activity  . Alcohol use: Yes    Alcohol/week: 2.0 standard drinks    Types: 2 Standard drinks or equivalent per week  . Drug use: No  . Sexual activity: Yes  Lifestyle  . Physical activity:    Days per week: Not on file    Minutes per session: Not on file  . Stress: Not on file  Relationships  . Social connections:    Talks on phone: Not on file    Gets together: Not on file    Attends religious service: Not on file    Active member of club or organization: Not on file    Attends meetings of clubs or organizations: Not on file    Relationship status: Not on file  . Intimate partner violence:    Fear of current or ex partner: Not on file     Emotionally abused: Not on file    Physically abused: Not on file    Forced sexual activity: Not on file  Other Topics Concern  . Not on file  Social History Narrative   Regular Exercise -  NO. Married.    Education: [employer].       Review of Systems  Constitutional: Negative for fatigue and unexpected weight change.  Eyes: Negative for  visual disturbance.  Respiratory: Negative for cough, chest tightness and shortness of breath.   Cardiovascular: Negative for chest pain, palpitations and leg swelling.  Gastrointestinal: Negative for abdominal pain and blood in stool.  Neurological: Negative for dizziness, light-headedness and headaches.       Objective:   Physical Exam Vitals signs reviewed.  Constitutional:      Appearance: He is well-developed.  HENT:     Head: Normocephalic and atraumatic.  Eyes:     Pupils: Pupils are equal, round, and reactive to light.  Neck:     Musculoskeletal: Normal range of motion.     Vascular: No carotid bruit or JVD.  Cardiovascular:     Rate and Rhythm: Normal rate and regular rhythm.     Heart sounds: Normal heart sounds. No murmur.  Pulmonary:     Effort: Pulmonary effort is normal.     Breath sounds: Normal breath sounds. No rales.  Abdominal:     Palpations: Abdomen is soft.     Tenderness: There is no abdominal tenderness.  Musculoskeletal:        General: Tenderness present.     Lumbar back: He exhibits tenderness (min paraspinal lower lumbar spine. ). He exhibits normal range of motion, no bony tenderness and no spasm.  Skin:    General: Skin is warm and dry.  Neurological:     Mental Status: He is alert and oriented to person, place, and time.     Sensory: No sensory deficit.     Deep Tendon Reflexes:     Reflex Scores:      Patellar reflexes are 1+ on the right side and 1+ on the left side.      Achilles reflexes are 1+ on the right side and 1+ on the left side.    Comments: Able to heel and toe walk without difficulty.  Negative seated straight leg raise.   Psychiatric:        Behavior: Behavior normal.    Vitals:   07/08/18 0758  BP: 112/68  Pulse: 94  Resp: 14  Temp: 98.2 F (36.8 C)  TempSrc: Oral  SpO2: 97%  Weight: 228 lb (103.4 kg)  Height: '5\' 9"'  (1.753 m)   Results for orders placed or performed in visit on 07/08/18  Lipid panel  Result Value Ref Range   Cholesterol, Total 113 100 - 199 mg/dL   Triglycerides 44 0 - 149 mg/dL   HDL 52 >39 mg/dL   VLDL Cholesterol Cal 9 5 - 40 mg/dL   LDL Calculated 52 0 - 99 mg/dL   Chol/HDL Ratio 2.2 0.0 - 5.0 ratio  Comprehensive metabolic panel  Result Value Ref Range   Glucose 165 (H) 65 - 99 mg/dL   BUN 13 6 - 24 mg/dL   Creatinine, Ser 0.99 0.76 - 1.27 mg/dL   GFR calc non Af Amer 84 >59 mL/min/1.73   GFR calc Af Amer 97 >59 mL/min/1.73   BUN/Creatinine Ratio 13 9 - 20   Sodium 140 134 - 144 mmol/L   Potassium 3.9 3.5 - 5.2 mmol/L   Chloride 103 96 - 106 mmol/L   CO2 22 20 - 29 mmol/L   Calcium 9.7 8.7 - 10.2 mg/dL   Total Protein 6.9 6.0 - 8.5 g/dL   Albumin 4.5 3.8 - 4.9 g/dL   Globulin, Total 2.4 1.5 - 4.5 g/dL   Albumin/Globulin Ratio 1.9 1.2 - 2.2   Bilirubin Total 0.2 0.0 - 1.2 mg/dL   Alkaline Phosphatase 63  39 - 117 IU/L   AST 15 0 - 40 IU/L   ALT 14 0 - 44 IU/L  POCT glycosylated hemoglobin (Hb A1C)  Result Value Ref Range   Hemoglobin A1C 7.8 (A) 4.0 - 5.6 %   HbA1c POC (<> result, manual entry)     HbA1c, POC (prediabetic range)     HbA1c, POC (controlled diabetic range)    POCT glucose (manual entry)  Result Value Ref Range   POC Glucose 176 (A) 70 - 99 mg/dl  POCT urinalysis dipstick  Result Value Ref Range   Color, UA yellow yellow   Clarity, UA clear clear   Glucose, UA negative negative mg/dL   Bilirubin, UA negative negative   Ketones, POC UA negative negative mg/dL   Spec Grav, UA 1.020 1.010 - 1.025   Blood, UA negative negative   pH, UA 7.0 5.0 - 8.0   Protein Ur, POC negative negative mg/dL    Urobilinogen, UA 1.0 0.2 or 1.0 E.U./dL   Nitrite, UA Negative Negative   Leukocytes, UA Negative Negative        Assessment & Plan:    Ian Salazar is a 58 y.o. male Controlled type 2 diabetes mellitus without complication, without long-term current use of insulin (HCC) - Plan: [facility] Diabetes Foot Exam, POCT glycosylated hemoglobin (Hb A1C), POCT glucose (manual entry), Microalbumin / creatinine urine ratio, glucose blood (ONE TOUCH ULTRA TEST) test strip, DISCONTINUED: glucose blood (ONE TOUCH ULTRA TEST) test strip  - not at goal.  Testing strips refilled, plan on discussion of options at upcoming visit in few weeks.  - optho follow up recommended.    Acute bilateral low back pain without sciatica - Plan: POCT urinalysis dipstick, meloxicam (MOBIC) 7.5 MG tablet  - overuse/strain possible with likely mechanical LBP.   -Symptomatic care discussed, short-term prescription meloxicam with potential side effects and risk discussed,  -Recheck next few weeks.  Hyperlipidemia, unspecified hyperlipidemia type - Plan: Lipid panel, Comprehensive metabolic panel  -Check labs, continue Lipitor same dose  Need for vaccination against Streptococcus pneumoniae - Plan: Pneumococcal polysaccharide vaccine 23-valent greater than or equal to 2yo subcutaneous/IM given  Tobacco abuse counseling - Plan: varenicline (CHANTIX STARTING MONTH PAK) 0.5 MG X 11 & 1 MG X 42 tablet, varenicline (CHANTIX CONTINUING MONTH PAK) 1 MG tablet Cigarette nicotine dependence without complication - Plan: varenicline (CHANTIX STARTING MONTH PAK) 0.5 MG X 11 & 1 MG X 42 tablet, varenicline (CHANTIX CONTINUING MONTH PAK) 1 MG tablet  -4-minute discussion on previous quit attempts, treatment options, Chantix refilled, potential side effects discussed. .  Handout given on smoking cessation.  Meds ordered this encounter  Medications  . DISCONTD: glucose blood (ONE TOUCH ULTRA TEST) test strip    Sig: USE TO TEST FASTING BLOOD  SUGARS DAILY    Dispense:  100 each    Refill:  11  . meloxicam (MOBIC) 7.5 MG tablet    Sig: Take 1 tablet (7.5 mg total) by mouth daily as needed for pain.    Dispense:  20 tablet    Refill:  0  . varenicline (CHANTIX STARTING MONTH PAK) 0.5 MG X 11 & 1 MG X 42 tablet    Sig: Take one 0.5 mg tablet by mouth once daily for 3 days, then increase to one 0.5 mg tablet twice daily for 4 days, then increase to one 1 mg tablet twice daily.    Dispense:  53 tablet    Refill:  0  . varenicline (  CHANTIX CONTINUING MONTH PAK) 1 MG tablet    Sig: Take 1 tablet (1 mg total) by mouth 2 (two) times daily.    Dispense:  60 tablet    Refill:  2  . glucose blood (ONE TOUCH ULTRA TEST) test strip    Sig: USE TO TEST FASTING BLOOD SUGARS DAILY    Dispense:  100 each    Refill:  11   Patient Instructions   Call eye care provider for appointment - once per year with diabetes.  chantix for quitting smoking.  Let me know if other assistance is needed to quit. 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  No concerns on exam today.  See information below.  Try range of motion stretches throughout the day, heat to affected areas or ice if that feels better.  Meloxicam prescription given temporarily, but that medicine has risks when used long-term.  Please follow-up in the next 2 weeks to decide on next step.  Return to the clinic or go to the nearest emergency room if any of your symptoms worsen or new symptoms occur.   Acute Back Pain, Adult Acute back pain is sudden and usually short-lived. It is often caused by an injury to the muscles and tissues in the back. The injury may result from:  A muscle or ligament getting overstretched or torn (strained). Ligaments are tissues that connect bones to each other. Lifting something improperly can cause a back strain.  Wear and tear (degeneration) of the spinal disks. Spinal disks are circular tissue that provides cushioning between the bones of the  spine (vertebrae).  Twisting motions, such as while playing sports or doing yard work.  A hit to the back.  Arthritis. You may have a physical exam, lab tests, and imaging tests to find the cause of your pain. Acute back pain usually goes away with rest and home care. Follow these instructions at home: Managing pain, stiffness, and swelling  Take over-the-counter and prescription medicines only as told by your health care provider.  Your health care provider may recommend applying ice during the first 24-48 hours after your pain starts. To do this: ? Put ice in a plastic bag. ? Place a towel between your skin and the bag. ? Leave the ice on for 20 minutes, 2-3 times a day.  If directed, apply heat to the affected area as often as told by your health care provider. Use the heat source that your health care provider recommends, such as a moist heat pack or a heating pad. ? Place a towel between your skin and the heat source. ? Leave the heat on for 20-30 minutes. ? Remove the heat if your skin turns bright red. This is especially important if you are unable to feel pain, heat, or cold. You have a greater risk of getting burned. Activity   Do not stay in bed. Staying in bed for more than 1-2 days can delay your recovery.  Sit up and stand up straight. Avoid leaning forward when you sit, or hunching over when you stand. ? If you work at a desk, sit close to it so you do not need to lean over. Keep your chin tucked in. Keep your neck drawn back, and keep your elbows bent at a right angle. Your arms should look like the letter "L." ? Sit high and close to the steering wheel when you drive. Add lower back (lumbar) support to your car seat, if needed.  Take short walks  on even surfaces as soon as you are able. Try to increase the length of time you walk each day.  Do not sit, drive, or stand in one place for more than 30 minutes at a time. Sitting or standing for long periods of time can put  stress on your back.  Do not drive or use heavy machinery while taking prescription pain medicine.  Use proper lifting techniques. When you bend and lift, use positions that put less stress on your back: ? Douglas your knees. ? Keep the load close to your body. ? Avoid twisting.  Exercise regularly as told by your health care provider. Exercising helps your back heal faster and helps prevent back injuries by keeping muscles strong and flexible.  Work with a physical therapist to make a safe exercise program, as recommended by your health care provider. Do any exercises as told by your physical therapist. Lifestyle  Maintain a healthy weight. Extra weight puts stress on your back and makes it difficult to have good posture.  Avoid activities or situations that make you feel anxious or stressed. Stress and anxiety increase muscle tension and can make back pain worse. Learn ways to manage anxiety and stress, such as through exercise. General instructions  Sleep on a firm mattress in a comfortable position. Try lying on your side with your knees slightly bent. If you lie on your back, put a pillow under your knees.  Follow your treatment plan as told by your health care provider. This may include: ? Cognitive or behavioral therapy. ? Acupuncture or massage therapy. ? Meditation or yoga. Contact a health care provider if:  You have pain that is not relieved with rest or medicine.  You have increasing pain going down into your legs or buttocks.  Your pain does not improve after 2 weeks.  You have pain at night.  You lose weight without trying.  You have a fever or chills. Get help right away if:  You develop new bowel or bladder control problems.  You have unusual weakness or numbness in your arms or legs.  You develop nausea or vomiting.  You develop abdominal pain.  You feel faint. Summary  Acute back pain is sudden and usually short-lived.  Use proper lifting  techniques. When you bend and lift, use positions that put less stress on your back.  Take over-the-counter and prescription medicines and apply heat or ice as directed by your health care provider. This information is not intended to replace advice given to you by your health care provider. Make sure you discuss any questions you have with your health care provider. Document Released: 01/19/2005 Document Revised: 08/26/2017 Document Reviewed: 09/02/2016 Elsevier Interactive Patient Education  2019 [employer].   Steps to Quit Smoking  Smoking tobacco can be harmful to your health and can affect almost every organ in your body. Smoking puts you, and those around you, at risk for developing many serious chronic diseases. Quitting smoking is difficult, but it is one of the best things that you can do for your health. It is never too late to quit. What are the benefits of quitting smoking? When you quit smoking, you lower your risk of developing serious diseases and conditions, such as:  Lung cancer or lung disease, such as COPD.  Heart disease.  Stroke.  Heart attack.  Infertility.  Osteoporosis and bone fractures. Additionally, symptoms such as coughing, wheezing, and shortness of breath may get better when you quit. You may also find that  you get sick less often because your body is stronger at fighting off colds and infections. If you are pregnant, quitting smoking can help to reduce your chances of having a baby of low birth weight. How do I get ready to quit? When you decide to quit smoking, create a plan to make sure that you are successful. Before you quit:  Pick a date to quit. Set a date within the next two weeks to give you time to prepare.  Write down the reasons why you are quitting. Keep this list in places where you will see it often, such as on your bathroom mirror or in your car or wallet.  Identify the people, places, things, and activities that make you want to  smoke (triggers) and avoid them. Make sure to take these actions: ? Throw away all cigarettes at home, at work, and in your car. ? Throw away smoking accessories, such as Scientist, research (medical). ? Clean your car and make sure to empty the ashtray. ? Clean your home, including curtains and carpets.  Tell your family, friends, and coworkers that you are quitting. Support from your loved ones can make quitting easier.  Talk with your health care provider about your options for quitting smoking.  Find out what treatment options are covered by your health insurance. What strategies can I use to quit smoking? Talk with your healthcare provider about different strategies to quit smoking. Some strategies include:  Quitting smoking altogether instead of gradually lessening how much you smoke over a period of time. Research shows that quitting "cold [place]" is more successful than gradually quitting.  Attending in-person counseling to help you build problem-solving skills. You are more likely to have success in quitting if you attend several counseling sessions. Even short sessions of 10 minutes can be effective.  Finding resources and support systems that can help you to quit smoking and remain smoke-free after you quit. These resources are most helpful when you use them often. They can include: ? Online chats with a Social worker. ? Telephone quitlines. ? Careers information officer. ? Support groups or group counseling. ? Text messaging programs. ? Mobile phone applications.  Taking medicines to help you quit smoking. (If you are pregnant or breastfeeding, talk with your health care provider first.) Some medicines contain nicotine and some do not. Both types of medicines help with cravings, but the medicines that include nicotine help to relieve withdrawal symptoms. Your health care provider may recommend: ? Nicotine patches, gum, or lozenges. ? Nicotine inhalers or sprays. ? Non-nicotine medicine  that is taken by mouth. Talk with your health care provider about combining strategies, such as taking medicines while you are also receiving in-person counseling. Using these two strategies together makes you more likely to succeed in quitting than if you used either strategy on its own. If you are pregnant or breastfeeding, talk with your health care provider about finding counseling or other support strategies to quit smoking. Do not take medicine to help you quit smoking unless told to do so by your health care provider. What things can I do to make it easier to quit? Quitting smoking might feel overwhelming at first, but there is a lot that you can do to make it easier. Take these important actions:  Reach out to your family and friends and ask that they support and encourage you during this time. Call telephone quitlines, reach out to support groups, or work with a counselor for support.  Ask people who smoke  to avoid smoking around you.  Avoid places that trigger you to smoke, such as bars, parties, or smoke-break areas at work.  Spend time around people who do not smoke.  Lessen stress in your life, because stress can be a smoking trigger for some people. To lessen stress, try: ? Exercising regularly. ? Deep-breathing exercises. ? Yoga. ? Meditating. ? Performing a body scan. This involves closing your eyes, scanning your body from head to toe, and noticing which parts of your body are particularly tense. Purposefully relax the muscles in those areas.  Download or purchase mobile phone or tablet apps (applications) that can help you stick to your quit plan by providing reminders, tips, and encouragement. There are many free apps, such as QuitGuide from the [employer] Office manager for Disease Control and Prevention). You can find other support for quitting smoking (smoking cessation) through smokefree.gov and other websites. How will I feel when I quit smoking? Within the first 24 hours of  quitting smoking, you may start to feel some withdrawal symptoms. These symptoms are usually most noticeable 2-3 days after quitting, but they usually do not last beyond 2-3 weeks. Changes or symptoms that you might experience include:  Mood swings.  Restlessness, anxiety, or irritation.  Difficulty concentrating.  Dizziness.  Strong cravings for sugary foods in addition to nicotine.  Mild weight gain.  Constipation.  Nausea.  Coughing or a sore throat.  Changes in how your medicines work in your body.  A depressed mood.  Difficulty sleeping (insomnia). After the first 2-3 weeks of quitting, you may start to notice more positive results, such as:  Improved sense of smell and taste.  Decreased coughing and sore throat.  Slower heart rate.  Lower blood pressure.  Clearer skin.  The ability to breathe more easily.  Fewer sick days. Quitting smoking is very challenging for most people. Do not get discouraged if you are not successful the first time. Some people need to make many attempts to quit before they achieve long-term success. Do your best to stick to your quit plan, and talk with your health care provider if you have any questions or concerns. This information is not intended to replace advice given to you by your health care provider. Make sure you discuss any questions you have with your health care provider. Document Released: 01/13/2001 Document Revised: 08/25/2016 Document Reviewed: 06/05/2014 Elsevier Interactive Patient Education  2019 [employer].    Type 2 Diabetes Mellitus, Self Care, Adult When you have type 2 diabetes (type 2 diabetes mellitus), you must make sure your blood sugar (glucose) stays in a healthy range. You can do this with:  Nutrition.  Exercise.  Lifestyle changes.  Medicines or insulin, if needed.  Support from your doctors and others. How to stay aware of blood sugar   Check your blood sugar level every day, as often as  told.  Have your A1c (hemoglobin A1c) level checked two or more times a year. Have it checked more often if your doctor tells you to. Your doctor will set personal treatment goals for you. Generally, you should have these blood sugar levels:  Before meals (preprandial): 80-130 mg/dL (4.4-7.2 mmol/L).  After meals (postprandial): below 180 mg/dL (10 mmol/L).  A1c level: less than 7%. How to manage high and low blood sugar Signs of high blood sugar High blood sugar is called hyperglycemia. Know the signs of high blood sugar. Signs may include:  Feeling: ? Thirsty. ? Hungry. ? Very tired.  Needing to pee (  urinate) more than usual.  Blurry vision. Signs of low blood sugar Low blood sugar is called hypoglycemia. This is when blood sugar is at or below 70 mg/dL (3.9 mmol/L). Signs may include:  Feeling: ? Hungry. ? Worried or nervous (anxious). ? Sweaty and clammy. ? Confused. ? Dizzy. ? Sleepy. ? Sick to your stomach (nauseous).  Having: ? A fast heartbeat. ? A headache. ? A change in your vision. ? Jerky movements that you cannot control (seizure). ? Tingling or no feeling (numbness) around your mouth, lips, or tongue.  Having trouble with: ? Moving (coordination). ? Sleeping. ? Passing out (fainting). ? Getting upset easily (irritability). Treating low blood sugar To treat low blood sugar, eat or drink something sugary right away. If you can think clearly and swallow safely, follow the 15:15 rule:  Take 15 grams of a fast-acting carb (carbohydrate). Talk with your doctor about how much you should take.  Some fast-acting carbs are: ? Sugar tablets (glucose pills). Take 3-4 pills. ? 6-8 pieces of hard candy. ? 4-6 oz (120-150 mL) of fruit juice. ? 4-6 oz (120-150 mL) of regular (not diet) soda. ? 1 Tbsp (15 mL) honey or sugar.  Check your blood sugar 15 minutes after you take the carb.  If your blood sugar is still at or below 70 mg/dL (3.9 mmol/L), take 15  grams of a carb again.  If your blood sugar does not go above 70 mg/dL (3.9 mmol/L) after 3 tries, get help right away.  After your blood sugar goes back to normal, eat a meal or a snack within 1 hour. Treating very low blood sugar If your blood sugar is at or below 54 mg/dL (3 mmol/L), you have very low blood sugar (severe hypoglycemia). This is an emergency. Do not wait to see if the symptoms will go away. Get medical help right away. Call your local emergency services (911 in the [place] If you have very low blood sugar and you cannot eat or drink, you may need a glucagon shot (injection). A family member or friend should learn how to check your blood sugar and how to give you a glucagon shot. Ask your doctor if you need to have a glucagon shot kit at home. Follow these instructions at home: Medicine  Take insulin and diabetes medicines as told.  If your doctor says you should take more or less insulin and medicines, do this exactly as told.  Do not run out of insulin or medicines. Having diabetes can raise your risk for other long-term conditions. These include heart disease and kidney disease. Your doctor may prescribe medicines to help you not have these problems. Food   Make healthy food choices. These include: ? Chicken, fish, egg whites, and beans. ? Oats, whole wheat, bulgur, brown rice, quinoa, and millet. ? Fresh fruits and vegetables. ? Low-fat dairy products. ? Nuts, avocado, olive oil, and canola oil.  Meet with a food specialist (dietitian). He or she can help you make an eating plan that is right for you.  Follow instructions from your doctor about what you cannot eat or drink.  Drink enough fluid to keep your pee (urine) pale yellow.  Keep track of carbs that you eat. Do this by reading food labels and learning food serving sizes.  Follow your sick day plan when you cannot eat or drink normally. Make this plan with your doctor so it is ready to use. Activity   Exercise 3 or more times a week.  Do not go more than 2 days without exercising.  Talk with your doctor before you start a new exercise. Your doctor may need to tell you to change: ? How much insulin or medicines you take. ? How much food you eat. Lifestyle  Do not use any tobacco products. These include cigarettes, chewing tobacco, and e-cigarettes. If you need help quitting, ask your doctor.  Ask your doctor how much alcohol is safe for you.  Learn to deal with stress. If you need help with this, ask your doctor. Body care   Stay up to date with your shots (immunizations).  Have your eyes and feet checked by a doctor as often as told.  Check your skin and feet every day. Check for cuts, bruises, redness, blisters, or sores.  Brush your teeth and gums two times a day. Floss one or more times a day.  Go to the dentist one or more times every 6 months.  Stay at a healthy weight. General instructions  Take over-the-counter and prescription medicines only as told by your doctor.  Share your diabetes care plan with: ? Your work or school. ? People you live with.  Carry a card or wear jewelry that says you have diabetes.  Keep all follow-up visits as told by your doctor. This is important. Questions to ask your doctor  Do I need to meet with a diabetes educator?  Where can I find a support group for people with diabetes? Where to find more information To learn more about diabetes, visit:  American Diabetes Association: www.diabetes.org  American Association of Diabetes Educators: www.diabeteseducator.org Summary  When you have type 2 diabetes, you must make sure your blood sugar (glucose) stays in a healthy range.  Check your blood sugar every day, as often as told.  Having diabetes can raise your risk for other conditions. Your doctor may prescribe medicines to help you not have these problems.  Keep all follow-up visits as told by your doctor. This is important.  This information is not intended to replace advice given to you by your health care provider. Make sure you discuss any questions you have with your health care provider. Document Released: 05/13/2015 Document Revised: 07/12/2017 Document Reviewed: 02/22/2015 Elsevier Interactive Patient Education  [employer].   If you have lab work done today you will be contacted with your lab results within the next 2 weeks.  If you have not heard from [place] then please contact us. The fastest way to get your results is to register for My Chart.   IF you received an x-ray today, you will receive an invoice from [facility]. Please contact [facility] at [phone] with questions or concerns regarding your invoice.   IF you received labwork today, you will receive an invoice from [facility]. Please contact [facility] at [phone] with questions or concerns regarding your invoice.   Our billing staff will not be able to assist you with questions regarding bills from these companies.  You will be contacted with the lab results as soon as they are available. The fastest way to get your results is to activate your My Chart account. Instructions are located on the last page of this paperwork. If you have not heard from [place] regarding the results in 2 weeks, please contact this office.       Signed,   Merri Ray, MD [facility].  07/09/18 12:03 AM

## 2018-07-08 NOTE — Patient Instructions (Addendum)
Call eye care provider for appointment - once per year with diabetes.  chantix for quitting smoking.  Let me know if other assistance is needed to quit. 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  No concerns on exam today.  See information below.  Try range of motion stretches throughout the day, heat to affected areas or ice if that feels better.  Meloxicam prescription given temporarily, but that medicine has risks when used long-term.  Please follow-up in the next 2 weeks to decide on next step.  Return to the clinic or go to the nearest emergency room if any of your symptoms worsen or new symptoms occur.   Acute Back Pain, Adult Acute back pain is sudden and usually short-lived. It is often caused by an injury to the muscles and tissues in the back. The injury may result from:  A muscle or ligament getting overstretched or torn (strained). Ligaments are tissues that connect bones to each other. Lifting something improperly can cause a back strain.  Wear and tear (degeneration) of the spinal disks. Spinal disks are circular tissue that provides cushioning between the bones of the spine (vertebrae).  Twisting motions, such as while playing sports or doing yard work.  A hit to the back.  Arthritis. You may have a physical exam, lab tests, and imaging tests to find the cause of your pain. Acute back pain usually goes away with rest and home care. Follow these instructions at home: Managing pain, stiffness, and swelling  Take over-the-counter and prescription medicines only as told by your health care provider.  Your health care provider may recommend applying ice during the first 24-48 hours after your pain starts. To do this: ? Put ice in a plastic bag. ? Place a towel between your skin and the bag. ? Leave the ice on for 20 minutes, 2-3 times a day.  If directed, apply heat to the affected area as often as told by your health care provider. Use the heat source that your  health care provider recommends, such as a moist heat pack or a heating pad. ? Place a towel between your skin and the heat source. ? Leave the heat on for 20-30 minutes. ? Remove the heat if your skin turns bright red. This is especially important if you are unable to feel pain, heat, or cold. You have a greater risk of getting burned. Activity   Do not stay in bed. Staying in bed for more than 1-2 days can delay your recovery.  Sit up and stand up straight. Avoid leaning forward when you sit, or hunching over when you stand. ? If you work at a desk, sit close to it so you do not need to lean over. Keep your chin tucked in. Keep your neck drawn back, and keep your elbows bent at a right angle. Your arms should look like the letter "L." ? Sit high and close to the steering wheel when you drive. Add lower back (lumbar) support to your car seat, if needed.  Take short walks on even surfaces as soon as you are able. Try to increase the length of time you walk each day.  Do not sit, drive, or stand in one place for more than 30 minutes at a time. Sitting or standing for long periods of time can put stress on your back.  Do not drive or use heavy machinery while taking prescription pain medicine.  Use proper lifting techniques. When you bend and lift, use  positions that put less stress on your back: ? [place] your knees. ? Keep the load close to your body. ? Avoid twisting.  Exercise regularly as told by your health care provider. Exercising helps your back heal faster and helps prevent back injuries by keeping muscles strong and flexible.  Work with a physical therapist to make a safe exercise program, as recommended by your health care provider. Do any exercises as told by your physical therapist. Lifestyle  Maintain a healthy weight. Extra weight puts stress on your back and makes it difficult to have good posture.  Avoid activities or situations that make you feel anxious or stressed.  Stress and anxiety increase muscle tension and can make back pain worse. Learn ways to manage anxiety and stress, such as through exercise. General instructions  Sleep on a firm mattress in a comfortable position. Try lying on your side with your knees slightly bent. If you lie on your back, put a pillow under your knees.  Follow your treatment plan as told by your health care provider. This may include: ? Cognitive or behavioral therapy. ? Acupuncture or massage therapy. ? Meditation or yoga. Contact a health care provider if:  You have pain that is not relieved with rest or medicine.  You have increasing pain going down into your legs or buttocks.  Your pain does not improve after 2 weeks.  You have pain at night.  You lose weight without trying.  You have a fever or chills. Get help right away if:  You develop new bowel or bladder control problems.  You have unusual weakness or numbness in your arms or legs.  You develop nausea or vomiting.  You develop abdominal pain.  You feel faint. Summary  Acute back pain is sudden and usually short-lived.  Use proper lifting techniques. When you bend and lift, use positions that put less stress on your back.  Take over-the-counter and prescription medicines and apply heat or ice as directed by your health care provider. This information is not intended to replace advice given to you by your health care provider. Make sure you discuss any questions you have with your health care provider. Document Released: 01/19/2005 Document Revised: 08/26/2017 Document Reviewed: 09/02/2016 Elsevier Interactive Patient Education  2019 [employer].   Steps to Quit Smoking  Smoking tobacco can be harmful to your health and can affect almost every organ in your body. Smoking puts you, and those around you, at risk for developing many serious chronic diseases. Quitting smoking is difficult, but it is one of the best things that you can do for  your health. It is never too late to quit. What are the benefits of quitting smoking? When you quit smoking, you lower your risk of developing serious diseases and conditions, such as:  Lung cancer or lung disease, such as COPD.  Heart disease.  Stroke.  Heart attack.  Infertility.  Osteoporosis and bone fractures. Additionally, symptoms such as coughing, wheezing, and shortness of breath may get better when you quit. You may also find that you get sick less often because your body is stronger at fighting off colds and infections. If you are pregnant, quitting smoking can help to reduce your chances of having a baby of low birth weight. How do I get ready to quit? When you decide to quit smoking, create a plan to make sure that you are successful. Before you quit:  Pick a date to quit. Set a date within the next two weeks  to give you time to prepare.  Write down the reasons why you are quitting. Keep this list in places where you will see it often, such as on your bathroom mirror or in your car or wallet.  Identify the people, places, things, and activities that make you want to smoke (triggers) and avoid them. Make sure to take these actions: ? Throw away all cigarettes at home, at work, and in your car. ? Throw away smoking accessories, such as Scientist, research (medical). ? Clean your car and make sure to empty the ashtray. ? Clean your home, including curtains and carpets.  Tell your family, friends, and coworkers that you are quitting. Support from your loved ones can make quitting easier.  Talk with your health care provider about your options for quitting smoking.  Find out what treatment options are covered by your health insurance. What strategies can I use to quit smoking? Talk with your healthcare provider about different strategies to quit smoking. Some strategies include:  Quitting smoking altogether instead of gradually lessening how much you smoke over a period of time.  Research shows that quitting "cold [place]" is more successful than gradually quitting.  Attending in-person counseling to help you build problem-solving skills. You are more likely to have success in quitting if you attend several counseling sessions. Even short sessions of 10 minutes can be effective.  Finding resources and support systems that can help you to quit smoking and remain smoke-free after you quit. These resources are most helpful when you use them often. They can include: ? Online chats with a Social worker. ? Telephone quitlines. ? Careers information officer. ? Support groups or group counseling. ? Text messaging programs. ? Mobile phone applications.  Taking medicines to help you quit smoking. (If you are pregnant or breastfeeding, talk with your health care provider first.) Some medicines contain nicotine and some do not. Both types of medicines help with cravings, but the medicines that include nicotine help to relieve withdrawal symptoms. Your health care provider may recommend: ? Nicotine patches, gum, or lozenges. ? Nicotine inhalers or sprays. ? Non-nicotine medicine that is taken by mouth. Talk with your health care provider about combining strategies, such as taking medicines while you are also receiving in-person counseling. Using these two strategies together makes you more likely to succeed in quitting than if you used either strategy on its own. If you are pregnant or breastfeeding, talk with your health care provider about finding counseling or other support strategies to quit smoking. Do not take medicine to help you quit smoking unless told to do so by your health care provider. What things can I do to make it easier to quit? Quitting smoking might feel overwhelming at first, but there is a lot that you can do to make it easier. Take these important actions:  Reach out to your family and friends and ask that they support and encourage you during this time. Call  telephone quitlines, reach out to support groups, or work with a counselor for support.  Ask people who smoke to avoid smoking around you.  Avoid places that trigger you to smoke, such as bars, parties, or smoke-break areas at work.  Spend time around people who do not smoke.  Lessen stress in your life, because stress can be a smoking trigger for some people. To lessen stress, try: ? Exercising regularly. ? Deep-breathing exercises. ? Yoga. ? Meditating. ? Performing a body scan. This involves closing your eyes, scanning your body from head  to toe, and noticing which parts of your body are particularly tense. Purposefully relax the muscles in those areas.  Download or purchase mobile phone or tablet apps (applications) that can help you stick to your quit plan by providing reminders, tips, and encouragement. There are many free apps, such as QuitGuide from the [employer] Office manager for Disease Control and Prevention). You can find other support for quitting smoking (smoking cessation) through smokefree.gov and other websites. How will I feel when I quit smoking? Within the first 24 hours of quitting smoking, you may start to feel some withdrawal symptoms. These symptoms are usually most noticeable 2-3 days after quitting, but they usually do not last beyond 2-3 weeks. Changes or symptoms that you might experience include:  Mood swings.  Restlessness, anxiety, or irritation.  Difficulty concentrating.  Dizziness.  Strong cravings for sugary foods in addition to nicotine.  Mild weight gain.  Constipation.  Nausea.  Coughing or a sore throat.  Changes in how your medicines work in your body.  A depressed mood.  Difficulty sleeping (insomnia). After the first 2-3 weeks of quitting, you may start to notice more positive results, such as:  Improved sense of smell and taste.  Decreased coughing and sore throat.  Slower heart rate.  Lower blood pressure.  Clearer skin.  The  ability to breathe more easily.  Fewer sick days. Quitting smoking is very challenging for most people. Do not get discouraged if you are not successful the first time. Some people need to make many attempts to quit before they achieve long-term success. Do your best to stick to your quit plan, and talk with your health care provider if you have any questions or concerns. This information is not intended to replace advice given to you by your health care provider. Make sure you discuss any questions you have with your health care provider. Document Released: 01/13/2001 Document Revised: 08/25/2016 Document Reviewed: 06/05/2014 Elsevier Interactive Patient Education  2019 [employer].    Type 2 Diabetes Mellitus, Self Care, Adult When you have type 2 diabetes (type 2 diabetes mellitus), you must make sure your blood sugar (glucose) stays in a healthy range. You can do this with:  Nutrition.  Exercise.  Lifestyle changes.  Medicines or insulin, if needed.  Support from your doctors and others. How to stay aware of blood sugar   Check your blood sugar level every day, as often as told.  Have your A1c (hemoglobin A1c) level checked two or more times a year. Have it checked more often if your doctor tells you to. Your doctor will set personal treatment goals for you. Generally, you should have these blood sugar levels:  Before meals (preprandial): 80-130 mg/dL (4.4-7.2 mmol/L).  After meals (postprandial): below 180 mg/dL (10 mmol/L).  A1c level: less than 7%. How to manage high and low blood sugar Signs of high blood sugar High blood sugar is called hyperglycemia. Know the signs of high blood sugar. Signs may include:  Feeling: ? Thirsty. ? Hungry. ? Very tired.  Needing to pee (urinate) more than usual.  Blurry vision. Signs of low blood sugar Low blood sugar is called hypoglycemia. This is when blood sugar is at or below 70 mg/dL (3.9 mmol/L). Signs may  include:  Feeling: ? Hungry. ? Worried or nervous (anxious). ? Sweaty and clammy. ? Confused. ? Dizzy. ? Sleepy. ? Sick to your stomach (nauseous).  Having: ? A fast heartbeat. ? A headache. ? A change in your vision. ? Jerky  movements that you cannot control (seizure). ? Tingling or no feeling (numbness) around your mouth, lips, or tongue.  Having trouble with: ? Moving (coordination). ? Sleeping. ? Passing out (fainting). ? Getting upset easily (irritability). Treating low blood sugar To treat low blood sugar, eat or drink something sugary right away. If you can think clearly and swallow safely, follow the 15:15 rule:  Take 15 grams of a fast-acting carb (carbohydrate). Talk with your doctor about how much you should take.  Some fast-acting carbs are: ? Sugar tablets (glucose pills). Take 3-4 pills. ? 6-8 pieces of hard candy. ? 4-6 oz (120-150 mL) of fruit juice. ? 4-6 oz (120-150 mL) of regular (not diet) soda. ? 1 Tbsp (15 mL) honey or sugar.  Check your blood sugar 15 minutes after you take the carb.  If your blood sugar is still at or below 70 mg/dL (3.9 mmol/L), take 15 grams of a carb again.  If your blood sugar does not go above 70 mg/dL (3.9 mmol/L) after 3 tries, get help right away.  After your blood sugar goes back to normal, eat a meal or a snack within 1 hour. Treating very low blood sugar If your blood sugar is at or below 54 mg/dL (3 mmol/L), you have very low blood sugar (severe hypoglycemia). This is an emergency. Do not wait to see if the symptoms will go away. Get medical help right away. Call your local emergency services (911 in the [place] If you have very low blood sugar and you cannot eat or drink, you may need a glucagon shot (injection). A family member or friend should learn how to check your blood sugar and how to give you a glucagon shot. Ask your doctor if you need to have a glucagon shot kit at home. Follow these instructions at  home: Medicine  Take insulin and diabetes medicines as told.  If your doctor says you should take more or less insulin and medicines, do this exactly as told.  Do not run out of insulin or medicines. Having diabetes can raise your risk for other long-term conditions. These include heart disease and kidney disease. Your doctor may prescribe medicines to help you not have these problems. Food   Make healthy food choices. These include: ? Chicken, fish, egg whites, and beans. ? Oats, whole wheat, bulgur, brown rice, quinoa, and millet. ? Fresh fruits and vegetables. ? Low-fat dairy products. ? Nuts, avocado, olive oil, and canola oil.  Meet with a food specialist (dietitian). He or she can help you make an eating plan that is right for you.  Follow instructions from your doctor about what you cannot eat or drink.  Drink enough fluid to keep your pee (urine) pale yellow.  Keep track of carbs that you eat. Do this by reading food labels and learning food serving sizes.  Follow your sick day plan when you cannot eat or drink normally. Make this plan with your doctor so it is ready to use. Activity  Exercise 3 or more times a week.  Do not go more than 2 days without exercising.  Talk with your doctor before you start a new exercise. Your doctor may need to tell you to change: ? How much insulin or medicines you take. ? How much food you eat. Lifestyle  Do not use any tobacco products. These include cigarettes, chewing tobacco, and e-cigarettes. If you need help quitting, ask your doctor.  Ask your doctor how much alcohol is safe for you.  Learn to deal with stress. If you need help with this, ask your doctor. Body care   Stay up to date with your shots (immunizations).  Have your eyes and feet checked by a doctor as often as told.  Check your skin and feet every day. Check for cuts, bruises, redness, blisters, or sores.  Brush your teeth and gums two times a day. Floss  one or more times a day.  Go to the dentist one or more times every 6 months.  Stay at a healthy weight. General instructions  Take over-the-counter and prescription medicines only as told by your doctor.  Share your diabetes care plan with: ? Your work or school. ? People you live with.  Carry a card or wear jewelry that says you have diabetes.  Keep all follow-up visits as told by your doctor. This is important. Questions to ask your doctor  Do I need to meet with a diabetes educator?  Where can I find a support group for people with diabetes? Where to find more information To learn more about diabetes, visit:  American Diabetes Association: www.diabetes.org  American Association of Diabetes Educators: www.diabeteseducator.org Summary  When you have type 2 diabetes, you must make sure your blood sugar (glucose) stays in a healthy range.  Check your blood sugar every day, as often as told.  Having diabetes can raise your risk for other conditions. Your doctor may prescribe medicines to help you not have these problems.  Keep all follow-up visits as told by your doctor. This is important. This information is not intended to replace advice given to you by your health care provider. Make sure you discuss any questions you have with your health care provider. Document Released: 05/13/2015 Document Revised: 07/12/2017 Document Reviewed: 02/22/2015 Elsevier Interactive Patient Education  [employer].   If you have lab work done today you will be contacted with your lab results within the next 2 weeks.  If you have not heard from [place] then please contact us. The fastest way to get your results is to register for My Chart.   IF you received an x-ray today, you will receive an invoice from [facility]. Please contact [facility] at [phone] with questions or concerns regarding your invoice.   IF you received labwork today, you will receive an invoice  from [facility]. Please contact [facility] at [phone] with questions or concerns regarding your invoice.   Our billing staff will not be able to assist you with questions regarding bills from these companies.  You will be contacted with the lab results as soon as they are available. The fastest way to get your results is to activate your My Chart account. Instructions are located on the last page of this paperwork. If you have not heard from [place] regarding the results in 2 weeks, please contact this office.

## 2018-07-09 ENCOUNTER — Encounter: Payer: Self-pay | Admitting: Family Medicine

## 2018-07-09 LAB — MICROALBUMIN / CREATININE URINE RATIO
Creatinine, Urine: 117.2 mg/dL
Microalb/Creat Ratio: <3 mg/g creat (ref 0–29)
Microalbumin, Urine: <3 ug/mL

## 2018-07-20 ENCOUNTER — Ambulatory Visit: Payer: PRIVATE HEALTH INSURANCE | Admitting: Family Medicine

## 2018-12-14 ENCOUNTER — Other Ambulatory Visit: Payer: Self-pay

## 2018-12-14 ENCOUNTER — Ambulatory Visit: Payer: PRIVATE HEALTH INSURANCE | Admitting: Family Medicine

## 2018-12-14 ENCOUNTER — Encounter: Payer: Self-pay | Admitting: Family Medicine

## 2018-12-14 VITALS — BP 116/76 | HR 77 | Temp 98.5°F | Ht 69.0 in | Wt 230.4 lb

## 2018-12-14 DIAGNOSIS — E785 Hyperlipidemia, unspecified: Secondary | ICD-10-CM

## 2018-12-14 DIAGNOSIS — E782 Mixed hyperlipidemia: Secondary | ICD-10-CM

## 2018-12-14 DIAGNOSIS — Z23 Encounter for immunization: Secondary | ICD-10-CM

## 2018-12-14 DIAGNOSIS — K219 Gastro-esophageal reflux disease without esophagitis: Secondary | ICD-10-CM

## 2018-12-14 DIAGNOSIS — E1165 Type 2 diabetes mellitus with hyperglycemia: Secondary | ICD-10-CM

## 2018-12-14 DIAGNOSIS — I10 Essential (primary) hypertension: Secondary | ICD-10-CM

## 2018-12-14 DIAGNOSIS — Z1159 Encounter for screening for other viral diseases: Secondary | ICD-10-CM

## 2018-12-14 LAB — POCT GLYCOSYLATED HEMOGLOBIN (HGB A1C): Hemoglobin A1C: 8 % — AB (ref 4.0–5.6)

## 2018-12-14 MED ORDER — OMEPRAZOLE 20 MG PO CPDR: DELAYED_RELEASE_CAPSULE | ORAL | 2 refills | Status: DC

## 2018-12-14 MED ORDER — LISINOPRIL 10 MG PO TABS: 10.0000 mg | ORAL_TABLET | Freq: Every day | ORAL | 2 refills | Status: DC

## 2018-12-14 MED ORDER — SHINGRIX 50 MCG/0.5ML IM SUSR: 0.5000 mL | Freq: Once | INTRAMUSCULAR | 1 refills | Status: AC

## 2018-12-14 MED ORDER — METFORMIN HCL 1000 MG PO TABS: 1000.0000 mg | ORAL_TABLET | Freq: Two times a day (BID) | ORAL | 2 refills | Status: DC

## 2018-12-14 MED ORDER — JARDIANCE 10 MG PO TABS: 10.0000 mg | ORAL_TABLET | Freq: Every day | ORAL | 2 refills | Status: DC

## 2018-12-14 MED ORDER — ATORVASTATIN CALCIUM 20 MG PO TABS: 20.0000 mg | ORAL_TABLET | Freq: Every day | ORAL | 2 refills | Status: DC

## 2018-12-14 NOTE — Progress Notes (Signed)
Subjective:  Patient ID: Ian Salazar, male    DOB: [birth year]  Age: 58 y.o. MRN: [id number]  CC:  Chief Complaint  Patient presents with  . Follow-up    chronic condition    HPI ORLIN KANN presents for   Diabetes: Plan for follow-up visit from June 5 to discuss options as A1c not at controlled level.  Currently takingMetformin 1000 mg twice daily.  He is taking ACE inhibitor and statin.  Microalbumin: Normal ratio July 08, 2018 Optho, foot exam, pneumovax: Overdue for ophthalmology exam, due for pneumococcal vaccine, flu vaccine, hep C screening. Plans to make appt with optho. Agrees to vaccines and labs above.  Immunization History  Administered Date(s) Administered  . Influenza Whole 11/15/2008, 04/18/2010  . Influenza-Unspecified 11/02/2014, 11/02/2016  . Td 11/15/2008   Home readings: 114-130 fasting. No postprandial readings.  No new side effects with meds - compliant with metformin BID.   Lab Results  Component Value Date   HGBA1C 7.8 (A) 07/08/2018   HGBA1C 11.4 12/12/2016   HGBA1C 9.4 01/14/2015   Lab Results  Component Value Date   LDLCALC 52 07/08/2018   CREATININE 0.99 07/08/2018   Hyperlipidemia: Lipitor 20 mg daily.  Ran out 2 weeks ago. No side effects/myalgias on meds.  Lab Results  Component Value Date   CHOL 113 07/08/2018   HDL 52 07/08/2018   LDLCALC 52 07/08/2018   TRIG 44 07/08/2018   CHOLHDL 2.2 07/08/2018   Lab Results  Component Value Date   ALT 14 07/08/2018   AST 15 07/08/2018   ALKPHOS 63 07/08/2018   BILITOT 0.2 07/08/2018   GERD:  Improved off cigarettes, but taking prilosec - off 2 weeks with some return of symptoms.   Hypertension: Lisinopril 10 mg daily.  No new side effects.  BP Readings from Last 3 Encounters:  12/14/18 116/76  07/08/18 112/68  06/08/18 108/68   Lab Results  Component Value Date   CREATININE 0.99 07/08/2018   Tobacco abuse Discussed in June 5 visit, Chantix was prescribed. Quit on  8/23.  Started back with a few a week ago - 1 this morning. Felt good not to smoke.    History Patient Active Problem List   Diagnosis Date Noted  . Uncontrolled type 2 diabetes mellitus with insulin therapy ([place]) 04/22/2017  . Hx of adenomatous colonic polyps 03/28/2015  . Routine general medical examination at a health care facility 07/17/2011  . Mixed hyperlipidemia 07/17/2011  . Abnormal chest x-ray 07/17/2011  . Vitamin B 12 deficiency 04/22/2010  . ANEMIA-NOS 04/18/2010  . TOBACCO USE 04/18/2010  . DEGENERATIVE JOINT DISEASE, KNEE 10/24/2008  . DM w/o complication type II 37/34/2876   Past Medical History:  Diagnosis Date  . Abnormal EKG    Normal ETT 5 years ago  . Anemia   . GERD (gastroesophageal reflux disease)    occasional  . Hx of adenomatous colonic polyps 03/28/2015  . Knee pain, right   . Rotator cuff tendonitis    Left  . Type II or unspecified type diabetes mellitus without mention of complication, not stated as uncontrolled    Past Surgical History:  Procedure Laterality Date  . MULTIPLE TOOTH EXTRACTIONS     No Known Allergies Prior to Admission medications   Medication Sig Start Date End Date Taking? Authorizing Provider  atorvastatin (LIPITOR) 20 MG tablet Take 1 tablet (20 mg total) by mouth daily. 10/28/17   Jaynee Eagles, PA-C  blood glucose meter kit and supplies KIT  Dispense based on patient and insurance preference. Check your fasting blood sugars daily. (FOR ICD-9 250.00, 250.01). 12/12/16   Jaynee Eagles, PA-C  glucose blood (ONE TOUCH ULTRA TEST) test strip USE TO TEST FASTING BLOOD SUGARS DAILY 07/08/18   Wendie Agreste, MD  lisinopril (PRINIVIL,ZESTRIL) 10 MG tablet Take 1 tablet (10 mg total) by mouth daily. 10/28/17   Jaynee Eagles, PA-C  meloxicam (MOBIC) 7.5 MG tablet Take 1 tablet (7.5 mg total) by mouth daily as needed for pain. 07/08/18   Wendie Agreste, MD  metFORMIN (GLUCOPHAGE) 1000 MG tablet Take 1 tablet (1,000 mg total) by mouth 2  (two) times daily with a meal. 10/28/17   Jaynee Eagles, PA-C  omeprazole (PRILOSEC) 20 MG capsule TAKE 1 CAPSULE(20 MG) BY MOUTH DAILY 10/28/17   Jaynee Eagles, PA-C  ONETOUCH DELICA LANCETS 46K MISC USE TO TEST FASTING BLOOD SUGARS DAILY 02/23/17   Jaynee Eagles, PA-C  varenicline (CHANTIX CONTINUING MONTH PAK) 1 MG tablet Take 1 tablet (1 mg total) by mouth 2 (two) times daily. 07/08/18   Wendie Agreste, MD  varenicline (CHANTIX STARTING MONTH PAK) 0.5 MG X 11 & 1 MG X 42 tablet Take one 0.5 mg tablet by mouth once daily for 3 days, then increase to one 0.5 mg tablet twice daily for 4 days, then increase to one 1 mg tablet twice daily. 07/08/18   Wendie Agreste, MD   Social History   Socioeconomic History  . Marital status: Married    Spouse name: Not on file  . Number of children: Not on file  . Years of education: Not on file  . Highest education level: Not on file  Occupational History  . Occupation: [employer]  . Financial resource strain: Not on file  . Food insecurity    Worry: Not on file    Inability: Not on file  . Transportation needs    Medical: Not on file    Non-medical: Not on file  Tobacco Use  . Smoking status: Current Every Day Smoker    Packs/day: 0.50    Years: 13.00    Pack years: 6.50    Types: Cigarettes  . Smokeless tobacco: Never Used  Substance and Sexual Activity  . Alcohol use: Yes    Alcohol/week: 2.0 standard drinks    Types: 2 Standard drinks or equivalent per week  . Drug use: No  . Sexual activity: Yes  Lifestyle  . Physical activity    Days per week: Not on file    Minutes per session: Not on file  . Stress: Not on file  Relationships  . Social Herbalist on phone: Not on file    Gets together: Not on file    Attends religious service: Not on file    Active member of club or organization: Not on file    Attends meetings of clubs or organizations: Not on file    Relationship status: Not on file  . Intimate partner  violence    Fear of current or ex partner: Not on file    Emotionally abused: Not on file    Physically abused: Not on file    Forced sexual activity: Not on file  Other Topics Concern  . Not on file  Social History Narrative   Regular Exercise -  NO. Married.    Education: [employer].       Review of Systems  Constitutional: Negative for fatigue and unexpected weight change.  Eyes: Negative for visual disturbance.  Respiratory: Negative for cough, chest tightness and shortness of breath.   Cardiovascular: Negative for chest pain, palpitations and leg swelling.  Gastrointestinal: Negative for abdominal pain and blood in stool.  Neurological: Negative for dizziness, light-headedness and headaches.     Objective:   Vitals:   12/14/18 0839  BP: 116/76  Pulse: 77  Temp: 98.5 F (36.9 C)  SpO2: 97%  Weight: 230 lb 6.4 oz (104.5 kg)  Height: '5\' 9"'  (1.753 m)     Physical Exam Vitals signs reviewed.  Constitutional:      Appearance: He is well-developed.  HENT:     Head: Normocephalic and atraumatic.  Eyes:     Pupils: Pupils are equal, round, and reactive to light.  Neck:     Vascular: No carotid bruit or JVD.  Cardiovascular:     Rate and Rhythm: Normal rate and regular rhythm.     Heart sounds: Normal heart sounds. No murmur.  Pulmonary:     Effort: Pulmonary effort is normal.     Breath sounds: Normal breath sounds. No rales.  Skin:    General: Skin is warm and dry.  Neurological:     Mental Status: He is alert and oriented to person, place, and time.    Results for orders placed or performed in visit on 12/14/18  POCT glycosylated hemoglobin (Hb A1C)  Result Value Ref Range   Hemoglobin A1C 8.0 (A) 4.0 - 5.6 %   HbA1c POC (<> result, manual entry)     HbA1c, POC (prediabetic range)     HbA1c, POC (controlled diabetic range)         Assessment & Plan:  CAIDIN HEIDENREICH is a 58 y.o. male . Type 2 diabetes mellitus with hyperglycemia, without long-term  current use of insulin (HCC) - Plan: POCT glycosylated hemoglobin (Hb A1C)  -Still decreased control.  Continue Metformin, add Jardiance, potential side effects and risk discussed.  Recheck 3 months  Need for vaccination - Plan: Flu Vaccine, Pneumovax (PPSV23)  Hyperlipidemia, unspecified hyperlipidemia type Mixed hyperlipidemia - Plan: atorvastatin (LIPITOR) 20 MG tablet  -Tolerating Lipitor, labs previously ok.  Continue same  Essential hypertension - Plan: lisinopril (ZESTRIL) 10 MG tablet, metFORMIN (GLUCOPHAGE) 1000 MG tablet  -  Stable, tolerating current regimen. Medications refilled.   Need for shingles vaccine - Plan: Zoster Vaccine Adjuvanted [facility]) injection to pharmacy  Encounter for hepatitis C screening test for low risk patient - Plan: Hepatitis C antibody  Needs flu shot - Plan: Flu Vaccine  Need for prophylactic vaccination against Streptococcus pneumoniae (pneumococcus) - Plan: Pneumovax (PPSV23)  Gastroesophageal reflux disease, unspecified whether esophagitis present - Plan: omeprazole (PRILOSEC) 20 MG capsule  -Stable, continue omeprazole.  Intermittent dosing as option if  Symptoms stable, especially as he quits smoking again.  Handout given on coping with quitting smoking, RTC precautions..  No orders of the defined types were placed in this encounter.  There are no Patient Instructions on file for this visit.    Signed, Merri Ray, MD [facility]

## 2018-12-14 NOTE — Patient Instructions (Addendum)
Let me know if help needed for staying off cigarettes.  Schedule eye doctor visit.  Shingles vaccine sent to pharmacy. If less heartburn symptoms off cigarettes, can try spacing omeprazole out to every other day as needed.  If return of symptoms, take once per day. Continue metformin, but add jardiance once per day for diabetes. Continue to work on diet as well.  Recheck 3 months.    Coping with Quitting Smoking  Quitting smoking is a physical and mental challenge. You will face cravings, withdrawal symptoms, and temptation. Before quitting, work with your health care provider to make a plan that can help you cope. Preparation can help you quit and keep you from giving in. How can I cope with cravings? Cravings usually last for 5-10 minutes. If you get through it, the craving will pass. Consider taking the following actions to help you cope with cravings:  Keep your mouth busy: ? Chew sugar-free gum. ? Suck on hard candies or a straw. ? Brush your teeth.  Keep your hands and body busy: ? Immediately change to a different activity when you feel a craving. ? Squeeze or play with a ball. ? Do an activity or a hobby, like making bead jewelry, practicing needlepoint, or working with wood. ? Mix up your normal routine. ? Take a short exercise break. Go for a quick walk or run up and down stairs. ? Spend time in public places where smoking is not allowed.  Focus on doing something kind or helpful for someone else.  Call a friend or family member to talk during a craving.  Join a support group.  Call a quit line, such as 1-800-QUIT-NOW.  Talk with your health care provider about medicines that might help you cope with cravings and make quitting easier for you. How can I deal with withdrawal symptoms? Your body may experience negative effects as it tries to get used to not having nicotine in the system. These effects are called withdrawal symptoms. They may include:  Feeling hungrier  than normal.  Trouble concentrating.  Irritability.  Trouble sleeping.  Feeling depressed.  Restlessness and agitation.  Craving a cigarette. To manage withdrawal symptoms:  Avoid places, people, and activities that trigger your cravings.  Remember why you want to quit.  Get plenty of sleep.  Avoid coffee and other caffeinated drinks. These may worsen some of your symptoms. How can I handle social situations? Social situations can be difficult when you are quitting smoking, especially in the first few weeks. To manage this, you can:  Avoid parties, bars, and other social situations where people might be smoking.  Avoid alcohol.  Leave right away if you have the urge to smoke.  Explain to your family and friends that you are quitting smoking. Ask for understanding and support.  Plan activities with friends or family where smoking is not an option. What are some ways I can cope with stress? Wanting to smoke may cause stress, and stress can make you want to smoke. Find ways to manage your stress. Relaxation techniques can help. For example:  Breathe slowly and deeply, in through your nose and out through your mouth.  Listen to soothing, relaxing music.  Talk with a family member or friend about your stress.  Light a candle.  Soak in a bath or take a shower.  Think about a peaceful place. What are some ways I can prevent weight gain? Be aware that many people gain weight after they quit smoking. However, not everyone  does. To keep from gaining weight, have a plan in place before you quit and stick to the plan after you quit. Your plan should include:  Having healthy snacks. When you have a craving, it may help to: ? Eat plain popcorn, crunchy carrots, celery, or other cut vegetables. ? Chew sugar-free gum.  Changing how you eat: ? Eat small portion sizes at meals. ? Eat 4-6 small meals throughout the day instead of 1-2 large meals a day. ? Be mindful when you  eat. Do not watch television or do other things that might distract you as you eat.  Exercising regularly: ? Make time to exercise each day. If you do not have time for a long workout, do short bouts of exercise for 5-10 minutes several times a day. ? Do some form of strengthening exercise, like weight lifting, and some form of aerobic exercise, like running or swimming.  Drinking plenty of water or other low-calorie or no-calorie drinks. Drink 6-8 glasses of water daily, or as much as instructed by your health care provider. Summary  Quitting smoking is a physical and mental challenge. You will face cravings, withdrawal symptoms, and temptation to smoke again. Preparation can help you as you go through these challenges.  You can cope with cravings by keeping your mouth busy (such as by chewing gum), keeping your body and hands busy, and making calls to family, friends, or a helpline for people who want to quit smoking.  You can cope with withdrawal symptoms by avoiding places where people smoke, avoiding drinks with caffeine, and getting plenty of rest.  Ask your health care provider about the different ways to prevent weight gain, avoid stress, and handle social situations. This information is not intended to replace advice given to you by your health care provider. Make sure you discuss any questions you have with your health care provider. Document Released: 01/17/2016 Document Revised: 01/01/2017 Document Reviewed: 01/17/2016 Elsevier Patient Education  2020 [place].    Type 2 Diabetes Mellitus, Self Care, Adult When you have type 2 diabetes (type 2 diabetes mellitus), you must make sure your blood sugar (glucose) stays in a healthy range. You can do this with:  Nutrition.  Exercise.  Lifestyle changes.  Medicines or insulin, if needed.  Support from your doctors and others. How to stay aware of blood sugar   Check your blood sugar level every day, as often as  told.  Have your A1c (hemoglobin A1c) level checked two or more times a year. Have it checked more often if your doctor tells you to. Your doctor will set personal treatment goals for you. Generally, you should have these blood sugar levels:  Before meals (preprandial): 80-130 mg/dL (4.4-7.2 mmol/L).  After meals (postprandial): below 180 mg/dL (10 mmol/L).  A1c level: less than 7%. How to manage high and low blood sugar Signs of high blood sugar High blood sugar is called hyperglycemia. Know the signs of high blood sugar. Signs may include:  Feeling: ? Thirsty. ? Hungry. ? Very tired.  Needing to pee (urinate) more than usual.  Blurry vision. Signs of low blood sugar Low blood sugar is called hypoglycemia. This is when blood sugar is at orbelow 70 mg/dL (3.9 mmol/L). Signs may include:  Feeling: ? Hungry. ? Worried or nervous (anxious). ? Sweaty and clammy. ? Confused. ? Dizzy. ? Sleepy. ? Sick to your stomach (nauseous).  Having: ? A fast heartbeat. ? A headache. ? A change in your vision. ? Jerky movements  that you cannot control (seizure). ? Tingling or no feeling (numbness) around your mouth, lips, or tongue.  Having trouble with: ? Moving (coordination). ? Sleeping. ? Passing out (fainting). ? Getting upset easily (irritability). Treating low blood sugar To treat low blood sugar, eat or drink something sugary right away. If you can think clearly and swallow safely, follow the 15:15 rule:  Take 15 grams of a fast-acting carb (carbohydrate). Talk with your doctor about how much you should take.  Some fast-acting carbs are: ? Sugar tablets (glucose pills). Take 3-4 pills. ? 6-8 pieces of hard candy. ? 4-6 oz (120-150 mL) of fruit juice. ? 4-6 oz (120-150 mL) of regular (not diet) soda. ? 1 Tbsp (15 mL) honey or sugar.  Check your blood sugar 15 minutes after you take the carb.  If your blood sugar is still at or below 70 mg/dL (3.9 mmol/L), take 15 grams  of a carb again.  If your blood sugar does not go above 70 mg/dL (3.9 mmol/L) after 3 tries, get help right away.  After your blood sugar goes back to normal, eat a meal or a snack within 1 hour. Treating very low blood sugar If your blood sugar is at or below 54 mg/dL (3 mmol/L), you have very low blood sugar (severe hypoglycemia). This is an emergency. Do not wait to see if the symptoms will go away. Get medical help right away. Call your local emergency services (911 in the [place] If you have very low blood sugar and you cannot eat or drink, you may need a glucagon shot (injection). A family member or friend should learn how to check your blood sugar and how to give you a glucagon shot. Ask your doctor if you need to have a glucagon shot kit at home. Follow these instructions at home: Medicine  Take insulin and diabetes medicines as told.  If your doctor says you should take more or less insulin and medicines, do this exactly as told.  Do not run out of insulin or medicines. Having diabetes can raise your risk for other long-term conditions. These include heart disease and kidney disease. Your doctor may prescribe medicines to help you not have these problems. Food   Make healthy food choices. These include: ? Chicken, fish, egg whites, and beans. ? Oats, whole wheat, bulgur, brown rice, quinoa, and millet. ? Fresh fruits and vegetables. ? Low-fat dairy products. ? Nuts, avocado, olive oil, and canola oil.  Meet with a food specialist (dietitian). He or she can help you make an eating plan that is right for you.  Follow instructions from your doctor about what you cannot eat or drink.  Drink enough fluid to keep your pee (urine) pale yellow.  Keep track of carbs that you eat. Do this by reading food labels and learning food serving sizes.  Follow your sick day plan when you cannot eat or drink normally. Make this plan with your doctor so it is ready to use. Activity  Exercise  3 or more times a week.  Do not go more than 2 days without exercising.  Talk with your doctor before you start a new exercise. Your doctor may need to tell you to change: ? How much insulin or medicines you take. ? How much food you eat. Lifestyle  Do not use any tobacco products. These include cigarettes, chewing tobacco, and e-cigarettes. If you need help quitting, ask your doctor.  Ask your doctor how much alcohol is safe for you.  Learn to deal with stress. If you need help with this, ask your doctor. Body care   Stay up to date with your shots (immunizations).  Have your eyes and feet checked by a doctor as often as told.  Check your skin and feet every day. Check for cuts, bruises, redness, blisters, or sores.  Brush your teeth and gums two times a day. Floss one or more times a day.  Go to the dentist one or more times every 6 months.  Stay at a healthy weight. General instructions  Take over-the-counter and prescription medicines only as told by your doctor.  Share your diabetes care plan with: ? Your work or school. ? People you live with.  Carry a card or wear jewelry that says you have diabetes.  Keep all follow-up visits as told by your doctor. This is important. Questions to ask your doctor  Do I need to meet with a diabetes educator?  Where can I find a support group for people with diabetes? Where to find more information To learn more about diabetes, visit:  American Diabetes Association: www.diabetes.org  American Association of Diabetes Educators: www.diabeteseducator.org Summary  When you have type 2 diabetes, you must make sure your blood sugar (glucose) stays in a healthy range.  Check your blood sugar every day, as often as told.  Having diabetes can raise your risk for other conditions. Your doctor may prescribe medicines to help you not have these problems.  Keep all follow-up visits as told by your doctor. This is important. This  information is not intended to replace advice given to you by your health care provider. Make sure you discuss any questions you have with your health care provider. Document Released: 05/13/2015 Document Revised: 07/12/2017 Document Reviewed: 02/22/2015 Elsevier Patient Education  [employer].    If you have lab work done today you will be contacted with your lab results within the next 2 weeks.  If you have not heard from [place] then please contact us. The fastest way to get your results is to register for My Chart.   IF you received an x-ray today, you will receive an invoice from [facility]. Please contact [facility] at [phone] with questions or concerns regarding your invoice.   IF you received labwork today, you will receive an invoice from [place]. Please contact [facility] at [phone] with questions or concerns regarding your invoice.   Our billing staff will not be able to assist you with questions regarding bills from these companies.  You will be contacted with the lab results as soon as they are available. The fastest way to get your results is to activate your My Chart account. Instructions are located on the last page of this paperwork. If you have not heard from [place] regarding the results in 2 weeks, please contact this office.

## 2018-12-15 LAB — HEPATITIS C ANTIBODY: Hep C Virus Ab: <0.1 s/co ratio (ref 0.0–0.9)

## 2018-12-22 ENCOUNTER — Encounter: Payer: Self-pay | Admitting: Radiology

## 2019-03-17 ENCOUNTER — Encounter: Payer: Self-pay | Admitting: Family Medicine

## 2019-03-17 ENCOUNTER — Other Ambulatory Visit: Payer: Self-pay

## 2019-03-17 ENCOUNTER — Ambulatory Visit: Payer: PRIVATE HEALTH INSURANCE | Admitting: Family Medicine

## 2019-03-17 VITALS — BP 117/75 | HR 82 | Temp 98.1°F | Ht 69.5 in | Wt 226.0 lb

## 2019-03-17 DIAGNOSIS — E782 Mixed hyperlipidemia: Secondary | ICD-10-CM | POA: Diagnosis not present

## 2019-03-17 DIAGNOSIS — I10 Essential (primary) hypertension: Secondary | ICD-10-CM

## 2019-03-17 DIAGNOSIS — F17210 Nicotine dependence, cigarettes, uncomplicated: Secondary | ICD-10-CM

## 2019-03-17 DIAGNOSIS — E1165 Type 2 diabetes mellitus with hyperglycemia: Secondary | ICD-10-CM | POA: Diagnosis not present

## 2019-03-17 DIAGNOSIS — K219 Gastro-esophageal reflux disease without esophagitis: Secondary | ICD-10-CM

## 2019-03-17 MED ORDER — JARDIANCE 10 MG PO TABS: 10.0000 mg | ORAL_TABLET | Freq: Every day | ORAL | 2 refills | Status: DC

## 2019-03-17 MED ORDER — LISINOPRIL 10 MG PO TABS: 10.0000 mg | ORAL_TABLET | Freq: Every day | ORAL | 2 refills | Status: DC

## 2019-03-17 MED ORDER — METFORMIN HCL 1000 MG PO TABS: 1000.0000 mg | ORAL_TABLET | Freq: Two times a day (BID) | ORAL | 2 refills | Status: DC

## 2019-03-17 MED ORDER — ATORVASTATIN CALCIUM 20 MG PO TABS: 20.0000 mg | ORAL_TABLET | Freq: Every day | ORAL | 2 refills | Status: DC

## 2019-03-17 MED ORDER — OMEPRAZOLE 20 MG PO CPDR: DELAYED_RELEASE_CAPSULE | ORAL | 2 refills | Status: DC

## 2019-03-17 NOTE — Patient Instructions (Addendum)
Let me know if further assistance needed with quitting smoking.  No change in medications for now, I will check some lab work.  Follow-up in few weeks to review those labs and discuss your shoulder symptoms further.  Sooner if any new or worsening symptoms.    Coping with Quitting Smoking  Quitting smoking is a physical and mental challenge. You will face cravings, withdrawal symptoms, and temptation. Before quitting, work with your health care provider to make a plan that can help you cope. Preparation can help you quit and keep you from giving in. How can I cope with cravings? Cravings usually last for 5-10 minutes. If you get through it, the craving will pass. Consider taking the following actions to help you cope with cravings:  Keep your mouth busy: ? Chew sugar-free gum. ? Suck on hard candies or a straw. ? Brush your teeth.  Keep your hands and body busy: ? Immediately change to a different activity when you feel a craving. ? Squeeze or play with a ball. ? Do an activity or a hobby, like making bead jewelry, practicing needlepoint, or working with wood. ? Mix up your normal routine. ? Take a short exercise break. Go for a quick walk or run up and down stairs. ? Spend time in public places where smoking is not allowed.  Focus on doing something kind or helpful for someone else.  Call a friend or family member to talk during a craving.  Join a support group.  Call a quit line, such as 1-800-QUIT-NOW.  Talk with your health care provider about medicines that might help you cope with cravings and make quitting easier for you. How can I deal with withdrawal symptoms? Your body may experience negative effects as it tries to get used to not having nicotine in the system. These effects are called withdrawal symptoms. They may include:  Feeling hungrier than normal.  Trouble concentrating.  Irritability.  Trouble sleeping.  Feeling depressed.  Restlessness and  agitation.  Craving a cigarette. To manage withdrawal symptoms:  Avoid places, people, and activities that trigger your cravings.  Remember why you want to quit.  Get plenty of sleep.  Avoid coffee and other caffeinated drinks. These may worsen some of your symptoms. How can I handle social situations? Social situations can be difficult when you are quitting smoking, especially in the first few weeks. To manage this, you can:  Avoid parties, bars, and other social situations where people might be smoking.  Avoid alcohol.  Leave right away if you have the urge to smoke.  Explain to your family and friends that you are quitting smoking. Ask for understanding and support.  Plan activities with friends or family where smoking is not an option. What are some ways I can cope with stress? Wanting to smoke may cause stress, and stress can make you want to smoke. Find ways to manage your stress. Relaxation techniques can help. For example:  Breathe slowly and deeply, in through your nose and out through your mouth.  Listen to soothing, relaxing music.  Talk with a family member or friend about your stress.  Light a candle.  Soak in a bath or take a shower.  Think about a peaceful place. What are some ways I can prevent weight gain? Be aware that many people gain weight after they quit smoking. However, not everyone does. To keep from gaining weight, have a plan in place before you quit and stick to the plan after you quit.  Your plan should include:  Having healthy snacks. When you have a craving, it may help to: ? Eat plain popcorn, crunchy carrots, celery, or other cut vegetables. ? Chew sugar-free gum.  Changing how you eat: ? Eat small portion sizes at meals. ? Eat 4-6 small meals throughout the day instead of 1-2 large meals a day. ? Be mindful when you eat. Do not watch television or do other things that might distract you as you eat.  Exercising regularly: ? Make time  to exercise each day. If you do not have time for a long workout, do short bouts of exercise for 5-10 minutes several times a day. ? Do some form of strengthening exercise, like weight lifting, and some form of aerobic exercise, like running or swimming.  Drinking plenty of water or other low-calorie or no-calorie drinks. Drink 6-8 glasses of water daily, or as much as instructed by your health care provider. Summary  Quitting smoking is a physical and mental challenge. You will face cravings, withdrawal symptoms, and temptation to smoke again. Preparation can help you as you go through these challenges.  You can cope with cravings by keeping your mouth busy (such as by chewing gum), keeping your body and hands busy, and making calls to family, friends, or a helpline for people who want to quit smoking.  You can cope with withdrawal symptoms by avoiding places where people smoke, avoiding drinks with caffeine, and getting plenty of rest.  Ask your health care provider about the different ways to prevent weight gain, avoid stress, and handle social situations. This information is not intended to replace advice given to you by your health care provider. Make sure you discuss any questions you have with your health care provider. Document Revised: 01/01/2017 Document Reviewed: 01/17/2016 Elsevier Patient Education  [employer].    If you have lab work done today you will be contacted with your lab results within the next 2 weeks.  If you have not heard from [place] then please contact us. The fastest way to get your results is to register for My Chart.   IF you received an x-ray today, you will receive an invoice from [facility]. Please contact [facility] at [phone] with questions or concerns regarding your invoice.   IF you received labwork today, you will receive an invoice from [facility]. Please contact [facility] at [phone] with questions or concerns regarding  your invoice.   Our billing staff will not be able to assist you with questions regarding bills from these companies.  You will be contacted with the lab results as soon as they are available. The fastest way to get your results is to activate your My Chart account. Instructions are located on the last page of this paperwork. If you have not heard from [place] regarding the results in 2 weeks, please contact this office.

## 2019-03-17 NOTE — Progress Notes (Signed)
Subjective:  Patient ID: Ian Salazar, male    DOB: 10-02-1960  Age: 59 y.o. MRN: [id number]  CC:  Chief Complaint  Patient presents with  . Follow-up    x42mo Diabetes    HPI SSIDI DZIKOWSKIpresents for   Diabetes:  Complicated by hyperglycemia, last visit December 14, 2018 Currently treated with Jardiance 10 mg daily (added in November), Metformin 1000 mg twice daily.  He is on ACE inhibitor as well as statin. No new side effects of meds.  No urinary symptoms, no genital rash.  Microalbumin: Normal ratio July 08, 2018 Optho, foot exam, pneumovax:  Had ophthalmology exam in January.  due for tetanus - today.  Plans on covid vaccine when available.   Home readings: no fasting.  2hr PP - 130-149.    Lab Results  Component Value Date   HGBA1C 8.0 (A) 12/14/2018   HGBA1C 7.8 (A) 07/08/2018   HGBA1C 11.4 12/12/2016   Lab Results  Component Value Date   LDLCALC 52 07/08/2018   CREATININE 0.99 07/08/2018   Hyperlipidemia: Lipitor 20 mg daily. No new myalgias, side effects.  Lab Results  Component Value Date   CHOL 113 07/08/2018   HDL 52 07/08/2018   LDLCALC 52 07/08/2018   TRIG 44 07/08/2018   CHOLHDL 2.2 07/08/2018   Lab Results  Component Value Date   ALT 14 07/08/2018   AST 15 07/08/2018   ALKPHOS 63 07/08/2018   BILITOT 0.2 07/08/2018   Hypertension: Lisinopril 10 mg daily  - no cough, no new side effects.  Home readings: none.  BP Readings from Last 3 Encounters:  03/17/19 117/75  12/14/18 116/76  07/08/18 112/68   Lab Results  Component Value Date   CREATININE 0.99 07/08/2018   Nicotine addiction: Quit August 23 last year, started back with a few cigarettes prior to November visit.  Restarted Chantix, but then stopped  - felt a little dazed with it.  Few relapses - 8 days without smoking.  Cut back on coffee- trigger.   Longstanding episodic shoulder pain.  MRI 2009.  Plans to follow-up for separate visit to discuss this further and  possible imaging.  Denies recent injury or weakness, new changes.   History Patient Active Problem List   Diagnosis Date Noted  . Uncontrolled type 2 diabetes mellitus [place] 04/22[place]  . Hx of adenomatous colonic polyps 03/28/2015  . Routine general medical examination at a health care facility 07/17/2011  . Mixed hyperlipidemia 07/17/2011  . Abnormal chest x-ray 07/17/2011  . Vitamin B 12 deficiency 04/22/2010  . ANEMIA-NOS 04/18/2010  . TOBACCO USE 04/18/2010  . DEGENERATIVE JOINT DISEASE, KNEE 10/24/2008  . DM w/o complication type II 032/99/2426  Past Medical History:  Diagnosis Date  . Abnormal EKG    Normal ETT 5 years ago  . Anemia   . GERD (gastroesophageal reflux disease)    occasional  . Hx of adenomatous colonic polyps 03/28/2015  . Knee pain, right   . Rotator cuff tendonitis    Left  . Type II or unspecified type diabetes mellitus without mention of complication, not stated as uncontrolled    Past Surgical History:  Procedure Laterality Date  . MULTIPLE TOOTH EXTRACTIONS     No Known Allergies Prior to Admission medications   Medication Sig Start Date End Date Taking? Authorizing Provider  atorvastatin (LIPITOR) 20 MG tablet Take 1 tablet (20 mg total) by mouth daily. 12/14/18   GWendie Agreste MD  blood glucose meter kit and supplies KIT Dispense based on patient and insurance preference. Check your fasting blood sugars daily. (FOR ICD-9 250.00, 250.01). 12/12/16   Jaynee Eagles, PA-C  empagliflozin (JARDIANCE) 10 MG TABS tablet Take 10 mg by mouth daily. 12/14/18   Wendie Agreste, MD  glucose blood (ONE TOUCH ULTRA TEST) test strip USE TO TEST FASTING BLOOD SUGARS DAILY 07/08/18   Wendie Agreste, MD  lisinopril (ZESTRIL) 10 MG tablet Take 1 tablet (10 mg total) by mouth daily. 12/14/18   Wendie Agreste, MD  metFORMIN (GLUCOPHAGE) 1000 MG tablet Take 1 tablet (1,000 mg total) by mouth 2 (two) times daily with a meal. 12/14/18    Wendie Agreste, MD  omeprazole (PRILOSEC) 20 MG capsule TAKE 1 CAPSULE(20 MG) BY MOUTH DAILY 12/14/18   Wendie Agreste, MD  [facility] DELICA LANCETS 08U MISC USE TO TEST FASTING BLOOD SUGARS DAILY 02/23/17   Jaynee Eagles, PA-C  varenicline (CHANTIX CONTINUING MONTH PAK) 1 MG tablet Take 1 tablet (1 mg total) by mouth 2 (two) times daily. 07/08/18   Wendie Agreste, MD  varenicline (CHANTIX STARTING MONTH PAK) 0.5 MG X 11 & 1 MG X 42 tablet Take one 0.5 mg tablet by mouth once daily for 3 days, then increase to one 0.5 mg tablet twice daily for 4 days, then increase to one 1 mg tablet twice daily. 07/08/18   Wendie Agreste, MD   Social History   Socioeconomic History  . Marital status: Married    Spouse name: Not on file  . Number of children: Not on file  . Years of education: Not on file  . Highest education level: Not on file  Occupational History  . 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 Smoking status: Current Every Day Smoker    Packs/day: 0.50    Years: 13.00    Pack years: 6.50    Types: Cigarettes  . Smokeless tobacco: Never Used  Substance and Sexual Activity  . Alcohol use: Yes    Alcohol/week: 2.0 standard drinks    Types: 2 Standard drinks or equivalent per week  . Drug use: No  . Sexual activity: Yes  Other Topics Concern  . Not on file  Social History Narrative   Regular Exercise -  NO. Married.    Education: [employer].      Social Determinants of Health   Financial Resource Strain:   . Difficulty of Paying Living Expenses: Not on file  Food Insecurity:   . Worried About Charity fundraiser in the Last Year: Not on file  . Ran Out of Food in the Last Year: Not on file  Transportation Needs:   . Lack of Transportation (Medical): Not on file  . Lack of Transportation (Non-Medical): Not on file  Physical Activity:   . Days of Exercise per Week: Not on file  . Minutes of Exercise per Session: Not on file  Stress:   . Feeling of Stress : Not on file   Social Connections:   . Frequency of Communication with Friends and Family: Not on file  . Frequency of Social Gatherings with Friends and Family: Not on file  . Attends Religious Services: Not on file  . Active Member of Clubs or Organizations: Not on file  . Attends Archivist Meetings: Not on file  . Marital Status: Not on file  Intimate Partner Violence:   . Fear of Current or Ex-Partner: Not on file  .  Emotionally Abused: Not on file  . Physically Abused: Not on file  . Sexually Abused: Not on file    Review of Systems  Constitutional: Negative for fatigue and unexpected weight change.  Eyes: Negative for visual disturbance.  Respiratory: Negative for cough, chest tightness and shortness of breath.   Cardiovascular: Negative for chest pain, palpitations and leg swelling.  Gastrointestinal: Negative for abdominal pain and blood in stool.  Neurological: Negative for dizziness, light-headedness and headaches.     Objective:   Vitals:   03/17/19 1046  BP: 117/75  Pulse: 82  Temp: 98.1 F (36.7 C)  TempSrc: Temporal  SpO2: 98%  Weight: 226 lb (102.5 kg)  Height: 5' 9.5" (1.765 m)     Physical Exam Vitals reviewed.  Constitutional:      Appearance: He is well-developed.  HENT:     Head: Normocephalic and atraumatic.  Eyes:     Pupils: Pupils are equal, round, and reactive to light.  Neck:     Vascular: No carotid bruit or JVD.  Cardiovascular:     Rate and Rhythm: Normal rate and regular rhythm.     Heart sounds: Normal heart sounds. No murmur.  Pulmonary:     Effort: Pulmonary effort is normal.     Breath sounds: Normal breath sounds. No rales.  Skin:    General: Skin is warm and dry.  Neurological:     Mental Status: He is alert and oriented to person, place, and time.        Assessment & Plan:  CALLOWAY ANDRUS is a 59 y.o. male . Type 2 diabetes mellitus with hyperglycemia, without long-term current use of insulin (HCC) - Plan:  Hemoglobin A1c  -Tolerating current regimen, check A1c  Mixed hyperlipidemia - Plan: Lipid panel, Comprehensive metabolic panel, atorvastatin (LIPITOR) 20 MG tablet  -Continue Lipitor, check labs.  Tolerating current regimen  Essential hypertension - Plan: metFORMIN (GLUCOPHAGE) 1000 MG tablet, lisinopril (ZESTRIL) 10 MG tablet  Stable, continue lisinopril same dose Gastroesophageal reflux disease, unspecified whether esophagitis present - Plan: omeprazole (PRILOSEC) 20 MG capsule  -Omeprazole as needed, quitting smoking should also help.  Cigarette nicotine dependence without complication  -Commended on efforts and recent cessation.  Declined repeat Chantix.  Handout given on resources for continued smoking cessation.  RTC precautions.  Meds ordered this encounter  Medications  . omeprazole (PRILOSEC) 20 MG capsule    Sig: TAKE 1 CAPSULE(20 MG) BY MOUTH DAILY    Dispense:  90 capsule    Refill:  2  . metFORMIN (GLUCOPHAGE) 1000 MG tablet    Sig: Take 1 tablet (1,000 mg total) by mouth 2 (two) times daily with a meal.    Dispense:  180 tablet    Refill:  2  . empagliflozin (JARDIANCE) 10 MG TABS tablet    Sig: Take 10 mg by mouth daily.    Dispense:  90 tablet    Refill:  2  . lisinopril (ZESTRIL) 10 MG tablet    Sig: Take 1 tablet (10 mg total) by mouth daily.    Dispense:  90 tablet    Refill:  2  . atorvastatin (LIPITOR) 20 MG tablet    Sig: Take 1 tablet (20 mg total) by mouth daily.    Dispense:  90 tablet    Refill:  2   Patient Instructions    Let me know if further assistance needed with quitting smoking.  No change in medications for now, I will check some lab work.  Follow-up in few weeks to review those labs and discuss your shoulder symptoms further.  Sooner if any new or worsening symptoms.    Coping with Quitting Smoking  Quitting smoking is a physical and mental challenge. You will face cravings, withdrawal symptoms, and temptation. Before quitting, work  with your health care provider to make a plan that can help you cope. Preparation can help you quit and keep you from giving in. How can I cope with cravings? Cravings usually last for 5-10 minutes. If you get through it, the craving will pass. Consider taking the following actions to help you cope with cravings:  Keep your mouth busy: ? Chew sugar-free gum. ? Suck on hard candies or a straw. ? Brush your teeth.  Keep your hands and body busy: ? Immediately change to a different activity when you feel a craving. ? Squeeze or play with a ball. ? Do an activity or a hobby, like making bead jewelry, practicing needlepoint, or working with wood. ? Mix up your normal routine. ? Take a short exercise break. Go for a quick walk or run up and down stairs. ? Spend time in public places where smoking is not allowed.  Focus on doing something kind or helpful for someone else.  Call a friend or family member to talk during a craving.  Join a support group.  Call a quit line, such as 1-800-QUIT-NOW.  Talk with your health care provider about medicines that might help you cope with cravings and make quitting easier for you. How can I deal with withdrawal symptoms? Your body may experience negative effects as it tries to get used to not having nicotine in the system. These effects are called withdrawal symptoms. They may include:  Feeling hungrier than normal.  Trouble concentrating.  Irritability.  Trouble sleeping.  Feeling depressed.  Restlessness and agitation.  Craving a cigarette. To manage withdrawal symptoms:  Avoid places, people, and activities that trigger your cravings.  Remember why you want to quit.  Get plenty of sleep.  Avoid coffee and other caffeinated drinks. These may worsen some of your symptoms. How can I handle social situations? Social situations can be difficult when you are quitting smoking, especially in the first few weeks. To manage this, you can:   Avoid parties, bars, and other social situations where people might be smoking.  Avoid alcohol.  Leave right away if you have the urge to smoke.  Explain to your family and friends that you are quitting smoking. Ask for understanding and support.  Plan activities with friends or family where smoking is not an option. What are some ways I can cope with stress? Wanting to smoke may cause stress, and stress can make you want to smoke. Find ways to manage your stress. Relaxation techniques can help. For example:  Breathe slowly and deeply, in through your nose and out through your mouth.  Listen to soothing, relaxing music.  Talk with a family member or friend about your stress.  Light a candle.  Soak in a bath or take a shower.  Think about a peaceful place. What are some ways I can prevent weight gain? Be aware that many people gain weight after they quit smoking. However, not everyone does. To keep from gaining weight, have a plan in place before you quit and stick to the plan after you quit. Your plan should include:  Having healthy snacks. When you have a craving, it may help to: ? Eat plain popcorn, crunchy carrots,  celery, or other cut vegetables. ? Chew sugar-free gum.  Changing how you eat: ? Eat small portion sizes at meals. ? Eat 4-6 small meals throughout the day instead of 1-2 large meals a day. ? Be mindful when you eat. Do not watch television or do other things that might distract you as you eat.  Exercising regularly: ? Make time to exercise each day. If you do not have time for a long workout, do short bouts of exercise for 5-10 minutes several times a day. ? Do some form of strengthening exercise, like weight lifting, and some form of aerobic exercise, like running or swimming.  Drinking plenty of water or other low-calorie or no-calorie drinks. Drink 6-8 glasses of water daily, or as much as instructed by your health care provider. Summary  Quitting smoking  is a physical and mental challenge. You will face cravings, withdrawal symptoms, and temptation to smoke again. Preparation can help you as you go through these challenges.  You can cope with cravings by keeping your mouth busy (such as by chewing gum), keeping your body and hands busy, and making calls to family, friends, or a helpline for people who want to quit smoking.  You can cope with withdrawal symptoms by avoiding places where people smoke, avoiding drinks with caffeine, and getting plenty of rest.  Ask your health care provider about the different ways to prevent weight gain, avoid stress, and handle social situations. This information is not intended to replace advice given to you by your health care provider. Make sure you discuss any questions you have with your health care provider. Document Revised: 01/01/2017 Document Reviewed: 01/17/2016 Elsevier Patient Education  [employer].    If you have lab work done today you will be contacted with your lab results within the next 2 weeks.  If you have not heard from [place] then please contact us. The fastest way to get your results is to register for My Chart.   IF you received an x-ray today, you will receive an invoice from [facility]. Please contact [facility] at [phone] with questions or concerns regarding your invoice.   IF you received labwork today, you will receive an invoice from [facility]. Please contact [facility] at [phone] with questions or concerns regarding your invoice.   Our billing staff will not be able to assist you with questions regarding bills from these companies.  You will be contacted with the lab results as soon as they are available. The fastest way to get your results is to activate your My Chart account. Instructions are located on the last page of this paperwork. If you have not heard from [place] regarding the results in 2 weeks, please contact this office.         Signed,  Merri Ray, MD [facility]

## 2019-03-18 LAB — COMPREHENSIVE METABOLIC PANEL
ALT: 16 IU/L (ref 0–44)
AST: 15 IU/L (ref 0–40)
Albumin/Globulin Ratio: 1.9 (ref 1.2–2.2)
Albumin: 4.5 g/dL (ref 3.8–4.9)
Alkaline Phosphatase: 74 IU/L (ref 39–117)
BUN/Creatinine Ratio: 16 (ref 9–20)
BUN: 14 mg/dL (ref 6–24)
Bilirubin Total: 0.2 mg/dL (ref 0.0–1.2)
CO2: 21 mmol/L (ref 20–29)
Calcium: 9.5 mg/dL (ref 8.7–10.2)
Chloride: 102 mmol/L (ref 96–106)
Creatinine, Ser: 0.85 mg/dL (ref 0.76–1.27)
GFR calc Af Amer: 111 mL/min/{1.73_m2} (ref >59)
GFR calc non Af Amer: 96 mL/min/{1.73_m2} (ref >59)
Globulin, Total: 2.4 g/dL (ref 1.5–4.5)
Glucose: 125 mg/dL — ABNORMAL HIGH (ref 65–99)
Potassium: 4 mmol/L (ref 3.5–5.2)
Sodium: 138 mmol/L (ref 134–144)
Total Protein: 6.9 g/dL (ref 6.0–8.5)

## 2019-03-18 LAB — LIPID PANEL
Chol/HDL Ratio: 2.2 ratio (ref 0.0–5.0)
Cholesterol, Total: 96 mg/dL — ABNORMAL LOW (ref 100–199)
HDL: 44 mg/dL (ref >39)
LDL Chol Calc (NIH): 41 mg/dL (ref 0–99)
Triglycerides: 39 mg/dL (ref 0–149)
VLDL Cholesterol Cal: 11 mg/dL (ref 5–40)

## 2019-03-18 LAB — HEMOGLOBIN A1C
Est. average glucose Bld gHb Est-mCnc: 169 mg/dL
Hgb A1c MFr Bld: 7.5 % — ABNORMAL HIGH (ref 4.8–5.6)

## 2019-04-03 ENCOUNTER — Ambulatory Visit: Payer: PRIVATE HEALTH INSURANCE | Admitting: Family Medicine

## 2019-04-03 ENCOUNTER — Other Ambulatory Visit: Payer: Self-pay

## 2019-04-03 ENCOUNTER — Ambulatory Visit (INDEPENDENT_AMBULATORY_CARE_PROVIDER_SITE_OTHER): Payer: PRIVATE HEALTH INSURANCE

## 2019-04-03 ENCOUNTER — Encounter: Payer: Self-pay | Admitting: Family Medicine

## 2019-04-03 VITALS — BP 107/71 | HR 79 | Temp 98.2°F | Ht 69.5 in | Wt 224.0 lb

## 2019-04-03 DIAGNOSIS — M25512 Pain in left shoulder: Secondary | ICD-10-CM

## 2019-04-03 DIAGNOSIS — M5412 Radiculopathy, cervical region: Secondary | ICD-10-CM

## 2019-04-03 DIAGNOSIS — E1165 Type 2 diabetes mellitus with hyperglycemia: Secondary | ICD-10-CM | POA: Diagnosis not present

## 2019-04-03 NOTE — Progress Notes (Signed)
Subjective:  Patient ID: Ian Salazar, male    DOB: 23-Mar-1960  Age: 59 y.o. MRN: [id number]  CC:  Chief Complaint  Patient presents with  . Follow-up    on L shoulder pain. pt states his L shoulder has been popping alot more latly. pt states no swelling or tenderness. pt reports the area isn't warm to the touch. pain level is a 3/10.pt would also like to follow-up on his most recent lab work    HPI Ian Salazar presents for   L shoulder pain:  Off and on for 25 years. Hurts to lie down at times, resolves with sitting in chair. Returns every few months.  No known inciting activities.  Works at [employer]  - frequent lifting, some increased lifting past few months. More sore in shoulder. No pain during activity, no weakness, no limitation in motion.  Notices more at night. No neck pain, but pain in upper shoulder blade at times and numbness down to 2-3rd fingers when flares. No prior injection/surgery.  Tx: OTC or '800mg'$  ibuprofen - rare.    MRI left shoulder in 06/26/07: IMPRESSION:   1.  Rotator cuff tendinopathy without tear. 2.  Small to moderate glenohumeral joint effusion with evidence of synovitis. 3.  Acromioclavicular degenerative disease  Diabetes: Associated with Hyperglycemia.   Last A1c 7.5 on February, improved from 8.0 in November of last year.  He was continued on Metformin and Jardiance with option of additional medication or significant diet changes/activity and exercise changes. Plans on diet and exercise changes for now.   Lab Results  Component Value Date   HGBA1C 7.5 (H) 03/17/2019   HGBA1C 8.0 (A) 12/14/2018   HGBA1C 7.8 (A) 07/08/2018     History Patient Active Problem List   Diagnosis Date Noted  . Uncontrolled type 2 diabetes mellitus with insulin therapy (Real) 04/22/2017  . Hx of adenomatous colonic polyps 03/28/2015  . Routine general medical examination at a health care facility 07/17/2011  . Mixed hyperlipidemia 07/17/2011  .  Abnormal chest x-ray 07/17/2011  . Vitamin B 12 deficiency 04/22/2010  . ANEMIA-NOS 04/18/2010  . TOBACCO USE 04/18/2010  . DEGENERATIVE JOINT DISEASE, KNEE 10/24/2008  . DM w/o complication type II 08/02/1599   Past Medical History:  Diagnosis Date  . Abnormal EKG    Normal ETT 5 years ago  . Anemia   . GERD (gastroesophageal reflux disease)    occasional  . Hx of adenomatous colonic polyps 03/28/2015  . Knee pain, right   . Rotator cuff tendonitis    Left  . Type II or unspecified type diabetes mellitus without mention of complication, not stated as uncontrolled    Past Surgical History:  Procedure Laterality Date  . MULTIPLE TOOTH EXTRACTIONS     No Known Allergies Prior to Admission medications   Medication Sig Start Date End Date Taking? Authorizing Provider  atorvastatin (LIPITOR) 20 MG tablet Take 1 tablet (20 mg total) by mouth daily. 03/17/19  Yes Wendie Agreste, MD  blood glucose meter kit and supplies KIT Dispense based on patient and insurance preference. Check your fasting blood sugars daily. (FOR ICD-9 250.00, 250.01). 12/12/16  Yes Jaynee Eagles, PA-C  empagliflozin (JARDIANCE) 10 MG TABS tablet Take 10 mg by mouth daily. 03/17/19  Yes Wendie Agreste, MD  glucose blood (ONE TOUCH ULTRA TEST) test strip USE TO TEST FASTING BLOOD SUGARS DAILY 07/08/18  Yes Wendie Agreste, MD  lisinopril (ZESTRIL) 10 MG tablet Take 1 tablet (  10 mg total) by mouth daily. 03/17/19  Yes Wendie Agreste, MD  metFORMIN (GLUCOPHAGE) 1000 MG tablet Take 1 tablet (1,000 mg total) by mouth 2 (two) times daily with a meal. 03/17/19  Yes Wendie Agreste, MD  omeprazole (PRILOSEC) 20 MG capsule TAKE 1 CAPSULE(20 MG) BY MOUTH DAILY 03/17/19  Yes Wendie Agreste, MD  [facility] DELICA LANCETS 74Q MISC USE TO TEST FASTING BLOOD SUGARS DAILY 02/23/17  Yes Jaynee Eagles, PA-C  varenicline (CHANTIX CONTINUING MONTH PAK) 1 MG tablet Take 1 tablet (1 mg total) by mouth 2 (two) times daily. 07/08/18  Yes  Wendie Agreste, MD  varenicline (CHANTIX STARTING MONTH PAK) 0.5 MG X 11 & 1 MG X 42 tablet Take one 0.5 mg tablet by mouth once daily for 3 days, then increase to one 0.5 mg tablet twice daily for 4 days, then increase to one 1 mg tablet twice daily. 07/08/18  Yes Wendie Agreste, MD   Social History   Socioeconomic History  . Marital status: Married    Spouse name: Not on file  . Number of children: Not on file  . Years of education: Not on file  . Highest education level: Not on file  Occupational History  . 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 Smoking status: Current Every Day Smoker    Packs/day: 0.50    Years: 13.00    Pack years: 6.50    Types: Cigarettes  . Smokeless tobacco: Never Used  Substance and Sexual Activity  . Alcohol use: Yes    Alcohol/week: 2.0 standard drinks    Types: 2 Standard drinks or equivalent per week  . Drug use: No  . Sexual activity: Yes  Other Topics Concern  . Not on file  Social History Narrative   Regular Exercise -  NO. Married.    Education: [employer].      Social Determinants of Health   Financial Resource Strain:   . Difficulty of Paying Living Expenses: Not on file  Food Insecurity:   . Worried About Charity fundraiser in the Last Year: Not on file  . Ran Out of Food in the Last Year: Not on file  Transportation Needs:   . Lack of Transportation (Medical): Not on file  . Lack of Transportation (Non-Medical): Not on file  Physical Activity:   . Days of Exercise per Week: Not on file  . Minutes of Exercise per Session: Not on file  Stress:   . Feeling of Stress : Not on file  Social Connections:   . Frequency of Communication with Friends and Family: Not on file  . Frequency of Social Gatherings with Friends and Family: Not on file  . Attends Religious Services: Not on file  . Active Member of Clubs or Organizations: Not on file  . Attends Archivist Meetings: Not on file  . Marital Status: Not on file   Intimate Partner Violence:   . Fear of Current or Ex-Partner: Not on file  . Emotionally Abused: Not on file  . Physically Abused: Not on file  . Sexually Abused: Not on file    Review of Systems   Objective:   Vitals:   04/03/19 1115  BP: 107/71  Pulse: 79  Temp: 98.2 F (36.8 C)  TempSrc: Temporal  SpO2: 97%  Weight: 224 lb (101.6 kg)  Height: 5' 9.5" (1.765 m)     Physical Exam Constitutional:      General: He is not in  acute distress.    Appearance: He is well-developed.  HENT:     Head: Normocephalic and atraumatic.  Cardiovascular:     Rate and Rhythm: Normal rate.  Pulmonary:     Effort: Pulmonary effort is normal.  Musculoskeletal:     Left shoulder: No swelling, deformity, tenderness or bony tenderness. Normal range of motion. Normal strength (Full range of motion, full RTC strength, negative Neer, Hawkins, crossover, empty can.).     Cervical back: No deformity, signs of trauma, spasms, tenderness or bony tenderness. Decreased range of motion (Minimal decreased rotation and lateral flexion bilaterally, decreased extension by approximately 30 degrees.  Exam did not reproduce arm/shoulder symptoms.).  Neurological:     Mental Status: He is alert and oriented to person, place, and time.   Difficult reflexes upper extremities bilaterally, approximately 1+ at biceps bilaterally triceps 1+ bilaterally, difficult brachioradialis reflex.  Intact grip strength, neurovascular intact distally  DG Cervical Spine 2 or 3 views  Result Date: 04/03/2019 CLINICAL DATA:  59 year old male with left shoulder pain and numbness radiating to the 2nd fingers. No known injury. EXAM: CERVICAL SPINE - 2-3 VIEW COMPARISON:  Chest radiographs 07/17/2011. FINDINGS: Straightening of cervical lordosis. Normal prevertebral soft tissue contour. Normal C1-C2 alignment. Normal AP alignment. Cervicothoracic junction alignment is within normal limits. C4-C5 and C5-C6 disc and endplate degeneration  with some disc space loss. No acute osseous abnormality identified. Stable upper chest with evidence of apical lung scarring greater on the right. IMPRESSION: 1. No acute osseous abnormality identified in the cervical spine. 2. C4-C5 and C5-C6 disc and endplate degeneration. 3. Evidence of chronic apical lung scarring. Electronically Signed   By: Genevie Ann M.D.   On: 04/03/2019 12:26     Assessment & Plan:  KENDRE JACINTO is a 59 y.o. male . Left shoulder pain, unspecified chronicity - Plan: DG Shoulder Left  Cervical radiculopathy - Plan: DG Cervical Spine 2 or 3 views  -Could have component of rotator cuff tendinosis but overall shoulder exam is reassuring.  Previous MRI was noted with rotator cuff tendinosis.  Based on his radicular symptoms, cervical radiculopathy, C-spine source possible.  No appreciable weakness.  -Range of motion, handout given, low-dose Flexeril discussed with potential side effects and precautions given.  -Consider PT versus orthopedic eval if persistent.  Diabetes type 2 with hyperglycemia  -Treatment options discussed, he would like to try diet/exercise approach for now, continue Metformin and Jardiance.  Recheck 3 months   No orders of the defined types were placed in this encounter.  Patient Instructions    Continue jardiance and metformin, work on diet/exercise and recheck diabetes in 3 months.   Shoulder symptoms may be related to pinched nerve from the neck.  See information below on cervical radiculopathy.  If you are having some more pain in the neck shoulder or upper shoulder blade, can try muscle relaxant up to every 8 hours but do not drive or operate machinery when taking that medicine.  Occasional Tylenol is fine to take, Advil only if needed.  Recheck in the next 3 to 4 weeks if not improving.  Sooner if worse.  Physical therapy may also be needed depending on how things go over the next few weeks.   Cervical Radiculopathy  Cervical radiculopathy  happens when a nerve in the neck (a cervical nerve) is pinched or bruised. This condition can happen because of an injury to the cervical spine (vertebrae) in the neck, or as part of the normal aging process.  Pressure on the cervical nerves can cause pain or numbness that travels from the neck all the way down into the arm and fingers. Usually, this condition gets better with rest. Treatment may be needed if the condition does not improve. What are the causes? This condition may be caused by:  A neck injury.  A bulging (herniated) disk.  Muscle spasms.  Muscle tightness in the neck because of overuse.  Arthritis.  Breakdown or degeneration in the bones and joints of the spine (spondylosis) due to aging.  Bone spurs that may develop near the cervical nerves. What are the signs or symptoms? Symptoms of this condition include:  Pain. The pain may travel from the neck to the arm and hand. The pain can be severe or irritating. It may be worse when you move your neck.  Numbness or tingling in your arm or hand.  Weakness in the affected arm and hand, in severe cases. How is this diagnosed? This condition may be diagnosed based on your symptoms, your medical history, and a physical exam. You may also have tests, including:  X-rays.  A CT scan.  An MRI.  An electromyogram (EMG).  Nerve conduction tests. How is this treated? In many cases, treatment is not needed for this condition. With rest, the condition usually gets better over time. If treatment is needed, options may include:  Wearing a soft neck collar (cervical collar) for short periods of time, as told by your health care provider.  Doing physical therapy to strengthen your neck muscles.  Taking medicines, such as NSAIDs or oral corticosteroids.  Having spinal injections, in severe cases.  Having surgery. This may be needed if other treatments do not help. Different types of surgery may be done depending on the cause of  this condition. Follow these instructions at home: If you have a cervical collar:  Wear it as told by your health care provider. Remove it only as told by your health care provider.  Ask your health care provider if you can remove the collar for cleaning and bathing. If you are allowed to remove the collar for cleaning or bathing: ? Follow instructions from your health care provider about how to remove the collar safely. ? Clean the collar by wiping it with mild soap and water and drying it completely. ? Take out any removable pads in the collar every 1-2 days, and wash them by hand with soap and water. Let them air-dry completely before you put them back in the collar. ? Check your skin under the collar for irritation or sores. If you see any, tell your health care provider. Managing pain      Take over-the-counter and prescription medicines only as told by your health care provider.  If directed, put ice on the affected area. ? If you have a soft neck collar, remove it as told by your health care provider. ? Put ice in a plastic bag. ? Place a towel between your skin and the bag. ? Leave the ice on for 20 minutes, 2-3 times a day.  If applying ice does not help, you can try using heat. Use the heat source that your health care provider recommends, such as a moist heat pack or a heating pad. ? Place a towel between your skin and the heat source. ? Leave the heat on for 20-30 minutes. ? Remove the heat if your skin turns bright red. This is especially important if you are unable to feel pain, heat, or cold.  You may have a greater risk of getting burned.  Try a gentle neck and shoulder massage to help relieve symptoms. Activity  Rest as needed.  Return to your normal activities as told by your health care provider. Ask your health care provider what activities are safe for you.  Do stretching and strengthening exercises as told by your health care provider or physical therapist.   Do not lift anything that is heavier than 10 lb (4.5 kg) until your health care provider tells you that it is safe. General instructions  Use a flat pillow when you sleep.  Do not drive while wearing a cervical collar. If you do not have a cervical collar, ask your health care provider if it is safe to drive while your neck heals.  Ask your health care provider if the medicine prescribed to you requires you to avoid driving or using heavy machinery.  Do not use any products that contain nicotine or tobacco, such as cigarettes, e-cigarettes, and chewing tobacco. These can delay healing. If you need help quitting, ask your health care provider.  Keep all follow-up visits as told by your health care provider. This is important. Contact a health care provider if:  Your condition does not improve with treatment. Get help right away if:  Your pain gets much worse and cannot be controlled with medicines.  You have weakness or numbness in your hand, arm, face, or leg.  You have a high fever.  You have a stiff, rigid neck.  You lose control of your bowels or your bladder (have incontinence).  You have trouble with walking, balance, or speaking. Summary  Cervical radiculopathy happens when a nerve in the neck is pinched or bruised.  A nerve can get pinched from a bulging disk, arthritis, muscle spasms, or an injury to the neck.  Symptoms include pain, tingling, or numbness radiating from the neck into the arm or hand. Weakness can also occur in severe cases.  Treatment may include rest, wearing a cervical collar, and physical therapy. Medicines may be prescribed to help with pain. In severe cases, injections or surgery may be needed. This information is not intended to replace advice given to you by your health care provider. Make sure you discuss any questions you have with your health care provider. Document Revised: 12/10/2017 Document Reviewed: 12/10/2017 Elsevier Patient Education   [employer].  If you have lab work done today you will be contacted with your lab results within the next 2 weeks.  If you have not heard from [place] then please contact us. The fastest way to get your results is to register for My Chart.   IF you received an x-ray today, you will receive an invoice from [facility]. Please contact [facility] at [phone] with questions or concerns regarding your invoice.   IF you received labwork today, you will receive an invoice from [place]. Please contact [facility] at [phone] with questions or concerns regarding your invoice.   Our billing staff will not be able to assist you with questions regarding bills from these companies.  You will be contacted with the lab results as soon as they are available. The fastest way to get your results is to activate your My Chart account. Instructions are located on the last page of this paperwork. If you have not heard from [place] regarding the results in 2 weeks, please contact this office.         Signed, Merri Ray, MD [facility]

## 2019-04-03 NOTE — Patient Instructions (Addendum)
Continue jardiance and metformin, work on diet/exercise and recheck diabetes in 3 months.   Shoulder symptoms may be related to pinched nerve from the neck.  See information below on cervical radiculopathy.  If you are having some more pain in the neck shoulder or upper shoulder blade, can try muscle relaxant up to every 8 hours but do not drive or operate machinery when taking that medicine.  Occasional Tylenol is fine to take, Advil only if needed.  Recheck in the next 3 to 4 weeks if not improving.  Sooner if worse.  Physical therapy may also be needed depending on how things go over the next few weeks.   Cervical Radiculopathy  Cervical radiculopathy happens when a nerve in the neck (a cervical nerve) is pinched or bruised. This condition can happen because of an injury to the cervical spine (vertebrae) in the neck, or as part of the normal aging process. Pressure on the cervical nerves can cause pain or numbness that travels from the neck all the way down into the arm and fingers. Usually, this condition gets better with rest. Treatment may be needed if the condition does not improve. What are the causes? This condition may be caused by:  A neck injury.  A bulging (herniated) disk.  Muscle spasms.  Muscle tightness in the neck because of overuse.  Arthritis.  Breakdown or degeneration in the bones and joints of the spine (spondylosis) due to aging.  Bone spurs that may develop near the cervical nerves. What are the signs or symptoms? Symptoms of this condition include:  Pain. The pain may travel from the neck to the arm and hand. The pain can be severe or irritating. It may be worse when you move your neck.  Numbness or tingling in your arm or hand.  Weakness in the affected arm and hand, in severe cases. How is this diagnosed? This condition may be diagnosed based on your symptoms, your medical history, and a physical exam. You may also have tests, including:  X-rays.  A  CT scan.  An MRI.  An electromyogram (EMG).  Nerve conduction tests. How is this treated? In many cases, treatment is not needed for this condition. With rest, the condition usually gets better over time. If treatment is needed, options may include:  Wearing a soft neck collar (cervical collar) for short periods of time, as told by your health care provider.  Doing physical therapy to strengthen your neck muscles.  Taking medicines, such as NSAIDs or oral corticosteroids.  Having spinal injections, in severe cases.  Having surgery. This may be needed if other treatments do not help. Different types of surgery may be done depending on the cause of this condition. Follow these instructions at home: If you have a cervical collar:  Wear it as told by your health care provider. Remove it only as told by your health care provider.  Ask your health care provider if you can remove the collar for cleaning and bathing. If you are allowed to remove the collar for cleaning or bathing: ? Follow instructions from your health care provider about how to remove the collar safely. ? Clean the collar by wiping it with mild soap and water and drying it completely. ? Take out any removable pads in the collar every 1-2 days, and wash them by hand with soap and water. Let them air-dry completely before you put them back in the collar. ? Check your skin under the collar for irritation or sores. If  you see any, tell your health care provider. Managing pain      Take over-the-counter and prescription medicines only as told by your health care provider.  If directed, put ice on the affected area. ? If you have a soft neck collar, remove it as told by your health care provider. ? Put ice in a plastic bag. ? Place a towel between your skin and the bag. ? Leave the ice on for 20 minutes, 2-3 times a day.  If applying ice does not help, you can try using heat. Use the heat source that your health care  provider recommends, such as a moist heat pack or a heating pad. ? Place a towel between your skin and the heat source. ? Leave the heat on for 20-30 minutes. ? Remove the heat if your skin turns bright red. This is especially important if you are unable to feel pain, heat, or cold. You may have a greater risk of getting burned.  Try a gentle neck and shoulder massage to help relieve symptoms. Activity  Rest as needed.  Return to your normal activities as told by your health care provider. Ask your health care provider what activities are safe for you.  Do stretching and strengthening exercises as told by your health care provider or physical therapist.  Do not lift anything that is heavier than 10 lb (4.5 kg) until your health care provider tells you that it is safe. General instructions  Use a flat pillow when you sleep.  Do not drive while wearing a cervical collar. If you do not have a cervical collar, ask your health care provider if it is safe to drive while your neck heals.  Ask your health care provider if the medicine prescribed to you requires you to avoid driving or using heavy machinery.  Do not use any products that contain nicotine or tobacco, such as cigarettes, e-cigarettes, and chewing tobacco. These can delay healing. If you need help quitting, ask your health care provider.  Keep all follow-up visits as told by your health care provider. This is important. Contact a health care provider if:  Your condition does not improve with treatment. Get help right away if:  Your pain gets much worse and cannot be controlled with medicines.  You have weakness or numbness in your hand, arm, face, or leg.  You have a high fever.  You have a stiff, rigid neck.  You lose control of your bowels or your bladder (have incontinence).  You have trouble with walking, balance, or speaking. Summary  Cervical radiculopathy happens when a nerve in the neck is pinched or  bruised.  A nerve can get pinched from a bulging disk, arthritis, muscle spasms, or an injury to the neck.  Symptoms include pain, tingling, or numbness radiating from the neck into the arm or hand. Weakness can also occur in severe cases.  Treatment may include rest, wearing a cervical collar, and physical therapy. Medicines may be prescribed to help with pain. In severe cases, injections or surgery may be needed. This information is not intended to replace advice given to you by your health care provider. Make sure you discuss any questions you have with your health care provider. Document Revised: 12/10/2017 Document Reviewed: 12/10/2017 Elsevier Patient Education  [employer].  If you have lab work done today you will be contacted with your lab results within the next 2 weeks.  If you have not heard from [place] then please contact us. The  fastest way to get your results is to register for My Chart.   IF you received an x-ray today, you will receive an invoice from [facility]. Please contact [facility] at [phone] with questions or concerns regarding your invoice.   IF you received labwork today, you will receive an invoice from [place]. Please contact [facility] at [phone] with questions or concerns regarding your invoice.   Our billing staff will not be able to assist you with questions regarding bills from these companies.  You will be contacted with the lab results as soon as they are available. The fastest way to get your results is to activate your My Chart account. Instructions are located on the last page of this paperwork. If you have not heard from [place] regarding the results in 2 weeks, please contact this office.

## 2019-07-07 ENCOUNTER — Ambulatory Visit (INDEPENDENT_AMBULATORY_CARE_PROVIDER_SITE_OTHER): Payer: PRIVATE HEALTH INSURANCE | Admitting: Family Medicine

## 2019-07-07 ENCOUNTER — Encounter: Payer: Self-pay | Admitting: Family Medicine

## 2019-07-07 ENCOUNTER — Other Ambulatory Visit: Payer: Self-pay

## 2019-07-07 VITALS — BP 104/69 | HR 82 | Temp 98.2°F | Ht 69.5 in | Wt 213.0 lb

## 2019-07-07 DIAGNOSIS — R1013 Epigastric pain: Secondary | ICD-10-CM

## 2019-07-07 DIAGNOSIS — E1165 Type 2 diabetes mellitus with hyperglycemia: Secondary | ICD-10-CM

## 2019-07-07 DIAGNOSIS — I10 Essential (primary) hypertension: Secondary | ICD-10-CM | POA: Diagnosis not present

## 2019-07-07 DIAGNOSIS — Z23 Encounter for immunization: Secondary | ICD-10-CM | POA: Diagnosis not present

## 2019-07-07 DIAGNOSIS — E782 Mixed hyperlipidemia: Secondary | ICD-10-CM | POA: Diagnosis not present

## 2019-07-07 DIAGNOSIS — M25512 Pain in left shoulder: Secondary | ICD-10-CM

## 2019-07-07 DIAGNOSIS — F17210 Nicotine dependence, cigarettes, uncomplicated: Secondary | ICD-10-CM

## 2019-07-07 LAB — COMPREHENSIVE METABOLIC PANEL
ALT: 14 IU/L (ref 0–44)
AST: 14 IU/L (ref 0–40)
Albumin/Globulin Ratio: 2.1 (ref 1.2–2.2)
Albumin: 4.5 g/dL (ref 3.8–4.9)
Alkaline Phosphatase: 62 IU/L (ref 48–121)
BUN/Creatinine Ratio: 14 (ref 9–20)
BUN: 14 mg/dL (ref 6–24)
Bilirubin Total: 0.3 mg/dL (ref 0.0–1.2)
CO2: 24 mmol/L (ref 20–29)
Calcium: 9.5 mg/dL (ref 8.7–10.2)
Chloride: 101 mmol/L (ref 96–106)
Creatinine, Ser: 1.01 mg/dL (ref 0.76–1.27)
GFR calc Af Amer: 94 mL/min/{1.73_m2} (ref >59)
GFR calc non Af Amer: 81 mL/min/{1.73_m2} (ref >59)
Globulin, Total: 2.1 g/dL (ref 1.5–4.5)
Glucose: 119 mg/dL — ABNORMAL HIGH (ref 65–99)
Potassium: 4.2 mmol/L (ref 3.5–5.2)
Sodium: 144 mmol/L (ref 134–144)
Total Protein: 6.6 g/dL (ref 6.0–8.5)

## 2019-07-07 LAB — HEMOGLOBIN A1C
Est. average glucose Bld gHb Est-mCnc: 146 mg/dL
Hgb A1c MFr Bld: 6.7 % — ABNORMAL HIGH (ref 4.8–5.6)

## 2019-07-07 LAB — LIPID PANEL
Chol/HDL Ratio: 2.2 ratio (ref 0.0–5.0)
Cholesterol, Total: 99 mg/dL — ABNORMAL LOW (ref 100–199)
HDL: 45 mg/dL (ref >39)
LDL Chol Calc (NIH): 43 mg/dL (ref 0–99)
Triglycerides: 42 mg/dL (ref 0–149)
VLDL Cholesterol Cal: 11 mg/dL (ref 5–40)

## 2019-07-07 NOTE — Patient Instructions (Addendum)
Continue omeprazole daily. Stop ibuprofen. Tylenol if needed for pain. See foods to avoid for heartburn.  I will refer you to gastroenterology and shoulder specialist.   Depending on labs, may need med changes.   Good luck with quitting smoking, let me know if we can help.    Steps to Quit Smoking Smoking tobacco is the leading cause of preventable death. It can affect almost every organ in the body. Smoking puts you and those around you at risk for developing many serious chronic diseases. Quitting smoking can be difficult, but it is one of the best things that you can do for your health. It is never too late to quit. How do I get ready to quit? When you decide to quit smoking, create a plan to help you succeed. Before you quit:  Pick a date to quit. Set a date within the next 2 weeks to give you time to prepare.  Write down the reasons why you are quitting. Keep this list in places where you will see it often.  Tell your family, friends, and co-workers that you are quitting. Support from your loved ones can make quitting easier.  Talk with your health care provider about your options for quitting smoking.  Find out what treatment options are covered by your health insurance.  Identify people, places, things, and activities that make you want to smoke (triggers). Avoid them. What first steps can I take to quit smoking?  Throw away all cigarettes at home, at work, and in your car.  Throw away smoking accessories, such as Scientist, research (medical).  Clean your car. Make sure to empty the ashtray.  Clean your home, including curtains and carpets. What strategies can I use to quit smoking? Talk with your health care provider about combining strategies, such as taking medicines while you are also receiving in-person counseling. Using these two strategies together makes you more likely to succeed in quitting than if you used either strategy on its own.  If you are pregnant or  breastfeeding, talk with your health care provider about finding counseling or other support strategies to quit smoking. Do not take medicine to help you quit smoking unless your health care provider tells you to do so. To quit smoking: Quit right away  Quit smoking completely, instead of gradually reducing how much you smoke over a period of time. Research shows that stopping smoking right away is more successful than gradually quitting.  Attend in-person counseling to help you build problem-solving skills. You are more likely to succeed in quitting if you attend counseling sessions regularly. Even short sessions of 10 minutes can be effective. Take medicine You may take medicines to help you quit smoking. Some medicines require a prescription and some you can purchase over-the-counter. Medicines may have nicotine in them to replace the nicotine in cigarettes. Medicines may:  Help to stop cravings.  Help to relieve withdrawal symptoms. Your health care provider may recommend:  Nicotine patches, gum, or lozenges.  Nicotine inhalers or sprays.  Non-nicotine medicine that is taken by mouth. Find resources Find resources and support systems that can help you to quit smoking and remain smoke-free after you quit. These resources are most helpful when you use them often. They include:  Online chats with a Social worker.  Telephone quitlines.  Printed Furniture conservator/restorer.  Support groups or group counseling.  Text messaging programs.  Mobile phone apps or applications. Use apps that can help you stick to your quit plan by providing reminders,  tips, and encouragement. There are many free apps for mobile devices as well as websites. Examples include Quit Guide from the [employer] and smokefree.gov What things can I do to make it easier to quit?   Reach out to your family and friends for support and encouragement. Call telephone quitlines (1-800-QUIT-NOW), reach out to support groups, or work with a  counselor for support.  Ask people who smoke to avoid smoking around you.  Avoid places that trigger you to smoke, such as bars, parties, or smoke-break areas at work.  Spend time with people who do not smoke.  Lessen the stress in your life. Stress can be a smoking trigger for some people. To lessen stress, try: ? Exercising regularly. ? Doing deep-breathing exercises. ? Doing yoga. ? Meditating. ? Performing a body scan. This involves closing your eyes, scanning your body from head to toe, and noticing which parts of your body are particularly tense. Try to relax the muscles in those areas. How will I feel when I quit smoking? Day 1 to 3 weeks Within the first 24 hours of quitting smoking, you may start to feel withdrawal symptoms. These symptoms are usually most noticeable 2-3 days after quitting, but they usually do not last for more than 2-3 weeks. You may experience these symptoms:  Mood swings.  Restlessness, anxiety, or irritability.  Trouble concentrating.  Dizziness.  Strong cravings for sugary foods and nicotine.  Mild weight gain.  Constipation.  Nausea.  Coughing or a sore throat.  Changes in how the medicines that you take for unrelated issues work in your body.  Depression.  Trouble sleeping (insomnia). Week 3 and afterward After the first 2-3 weeks of quitting, you may start to notice more positive results, such as:  Improved sense of smell and taste.  Decreased coughing and sore throat.  Slower heart rate.  Lower blood pressure.  Clearer skin.  The ability to breathe more easily.  Fewer sick days. Quitting smoking can be very challenging. Do not get discouraged if you are not successful the first time. Some people need to make many attempts to quit before they achieve long-term success. Do your best to stick to your quit plan, and talk with your health care provider if you have any questions or concerns. Summary  Smoking tobacco is the  leading cause of preventable death. Quitting smoking is one of the best things that you can do for your health.  When you decide to quit smoking, create a plan to help you succeed.  Quit smoking right away, not slowly over a period of time.  When you start quitting, seek help from your health care provider, family, or friends. This information is not intended to replace advice given to you by your health care provider. Make sure you discuss any questions you have with your health care provider. Document Revised: 10/14/2018 Document Reviewed: 04/09/2018 Elsevier Patient Education  2020 [place] for Gastroesophageal Reflux Disease, Adult When you have gastroesophageal reflux disease (GERD), the foods you eat and your eating habits are very important. Choosing the right foods can help ease your discomfort. Think about working with a nutrition specialist (dietitian) to help you make good choices. What are tips for following this plan?  Meals  Choose healthy foods that are low in fat, such as fruits, vegetables, whole grains, low-fat dairy products, and lean meat, fish, and poultry.  Eat small meals often instead of 3 large meals a day. Eat your meals slowly,  and in a place where you are relaxed. Avoid bending over or lying down until 2-3 hours after eating.  Avoid eating meals 2-3 hours before bed.  Avoid drinking a lot of liquid with meals.  Cook foods using methods other than frying. Bake, grill, or broil food instead.  Avoid or limit: ? Chocolate. ? Peppermint or spearmint. ? Alcohol. ? Pepper. ? Black and decaffeinated coffee. ? Black and decaffeinated tea. ? Bubbly (carbonated) soft drinks. ? Caffeinated energy drinks and soft drinks.  Limit high-fat foods such as: ? Fatty meat or fried foods. ? Whole milk, cream, butter, or ice cream. ? Nuts and nut butters. ? Pastries, donuts, and sweets made with butter or shortening.  Avoid foods that cause  symptoms. These foods may be different for everyone. Common foods that cause symptoms include: ? Tomatoes. ? Oranges, lemons, and limes. ? Peppers. ? Spicy food. ? Onions and garlic. ? Vinegar. Lifestyle  Maintain a healthy weight. Ask your doctor what weight is healthy for you. If you need to lose weight, work with your doctor to do so safely.  Exercise for at least 30 minutes for 5 or more days each week, or as told by your doctor.  Wear loose-fitting clothes.  Do not smoke. If you need help quitting, ask your doctor.  Sleep with the head of your bed higher than your feet. Use a wedge under the mattress or blocks under the bed frame to raise the head of the bed. Summary  When you have gastroesophageal reflux disease (GERD), food and lifestyle choices are very important in easing your symptoms.  Eat small meals often instead of 3 large meals a day. Eat your meals slowly, and in a place where you are relaxed.  Limit high-fat foods such as fatty meat or fried foods.  Avoid bending over or lying down until 2-3 hours after eating.  Avoid peppermint and spearmint, caffeine, alcohol, and chocolate. This information is not intended to replace advice given to you by your health care provider. Make sure you discuss any questions you have with your health care provider. Document Revised: 05/12/2018 Document Reviewed: 02/25/2016 Elsevier Patient Education  [employer].   If you have lab work done today you will be contacted with your lab results within the next 2 weeks.  If you have not heard from [place] then please contact us. The fastest way to get your results is to register for My Chart.   IF you received an x-ray today, you will receive an invoice from [facility]. Please contact [facility] at [phone] with questions or concerns regarding your invoice.   IF you received labwork today, you will receive an invoice from [facility]. Please contact [facility] at  [phone] with questions or concerns regarding your invoice.   Our billing staff will not be able to assist you with questions regarding bills from these companies.  You will be contacted with the lab results as soon as they are available. The fastest way to get your results is to activate your My Chart account. Instructions are located on the last page of this paperwork. If you have not heard from [place] regarding the results in 2 weeks, please contact this office.

## 2019-07-07 NOTE — Progress Notes (Signed)
Subjective:  Patient ID: Ian Salazar, male    DOB: 1960-03-13  Age: 59 y.o. MRN: [id number]  CC:  Chief Complaint  Patient presents with  . Diabetes    pt reports no issues with this condition. pt reports he hasn't checked his BS in a week, but before that he was checking daily and it was ranging in the130-137m/dl range fasting.pt also states he is taking his medication as directed.  . Follow-up    on L shoulder pain. pt would like to know what the Doctors thoughts are about it from his last visit and [place]    HPI Ian AUKERpresents for   Left shoulder pain Last discussed in March.  Prior history of rotator cuff tendinopathy, AC degenerative disease.  Thought to have component of cervical radiculopathy more than rotator cuff issue.  Range of motion, handout given with low-dose Flexeril, PT versus orthopedic eval if persistent.  Still pain into L shoulder in am or with work. Rarely pain moving down arm or shoulder blade area. No neck pain.  No arm weakness.  R hand dominant.  Tx: ibuprofen 8055mQD prn. Helps pain.    R shoulder XR 3/1: IMPRESSION: 1. No evidence of fracture, malalignment or bony lesion. 2. Mild acromioclavicular joint degenerative osteoarthritis  Epigastric pain: Episodic for years.  No recent GI eval.  Resolves with omeprazole usually. Went away with stopping smoking for few weeks prior No recent worsening. No blood in stool.  No fever/night sweats.     nicotine addiction 1/2 ppd Plans to quit cold [place] med[place].   Diabetes: Complicated by hyperglycemia.  Treated with Jardiance 10 mg, Metformin 1000 mg twice daily.  He is on ACE inhibitor as well as statin.  Microalbumin normal ratio July 08, 2018.  Ophthalmology exam in January. Optho, foot exam, pneumovax: Up-to-date Improve diet discussed with last A1c of 7.5 Home readings: 130-140 fasting few weeks ago.  Trying to avoid late meals. Walking dog more.  No new  side effects of meds.   Lab Results  Component Value Date   HGBA1C 7.5 (H) 03/17/2019   HGBA1C 8.0 (A) 12/14/2018   HGBA1C 7.8 (A) 07/08/2018   Lab Results  Component Value Date   LDLCALC 41 03/17/2019   CREATININE 0.85 03/17/2019        History Patient Active Problem List   Diagnosis Date Noted  . Uncontrolled type 2 diabetes mellitus with insulin therapy (HCPinconning03/21/2019  . Hx of adenomatous colonic polyps 03/28/2015  . Routine general medical examination at a health care facility 07/17/2011  . Mixed hyperlipidemia 07/17/2011  . Abnormal chest x-ray 07/17/2011  . Vitamin B 12 deficiency 04/22/2010  . ANEMIA-NOS 04/18/2010  . TOBACCO USE 04/18/2010  . DEGENERATIVE JOINT DISEASE, KNEE 10/24/2008  . DM w/o complication type II 0140/98/1191 Past Medical History:  Diagnosis Date  . Abnormal EKG    Normal ETT 5 years ago  . Anemia   . GERD (gastroesophageal reflux disease)    occasional  . Hx of adenomatous colonic polyps 03/28/2015  . Knee pain, right   . Rotator cuff tendonitis    Left  . Type II or unspecified type diabetes mellitus without mention of complication, not stated as uncontrolled    Past Surgical History:  Procedure Laterality Date  . MULTIPLE TOOTH EXTRACTIONS     No Known Allergies Prior to Admission medications   Medication Sig Start Date End Date Taking? Authorizing Provider  atorvastatin (  LIPITOR) 20 MG tablet Take 1 tablet (20 mg total) by mouth daily. 03/17/19  Yes Wendie Agreste, MD  blood glucose meter kit and supplies KIT Dispense based on patient and insurance preference. Check your fasting blood sugars daily. (FOR ICD-9 250.00, 250.01). 12/12/16  Yes Jaynee Eagles, PA-C  empagliflozin (JARDIANCE) 10 MG TABS tablet Take 10 mg by mouth daily. 03/17/19  Yes Wendie Agreste, MD  glucose blood (ONE TOUCH ULTRA TEST) test strip USE TO TEST FASTING BLOOD SUGARS DAILY 07/08/18  Yes Wendie Agreste, MD  lisinopril (ZESTRIL) 10 MG tablet Take 1  tablet (10 mg total) by mouth daily. 03/17/19  Yes Wendie Agreste, MD  metFORMIN (GLUCOPHAGE) 1000 MG tablet Take 1 tablet (1,000 mg total) by mouth 2 (two) times daily with a meal. 03/17/19  Yes Wendie Agreste, MD  omeprazole (PRILOSEC) 20 MG capsule TAKE 1 CAPSULE(20 MG) BY MOUTH DAILY 03/17/19  Yes Wendie Agreste, MD  [facility] DELICA LANCETS 46K MISC USE TO TEST FASTING BLOOD SUGARS DAILY 02/23/17  Yes Jaynee Eagles, PA-C  varenicline (CHANTIX CONTINUING MONTH PAK) 1 MG tablet Take 1 tablet (1 mg total) by mouth 2 (two) times daily. 07/08/18  Yes Wendie Agreste, MD  varenicline (CHANTIX STARTING MONTH PAK) 0.5 MG X 11 & 1 MG X 42 tablet Take one 0.5 mg tablet by mouth once daily for 3 days, then increase to one 0.5 mg tablet twice daily for 4 days, then increase to one 1 mg tablet twice daily. 07/08/18  Yes Wendie Agreste, MD   Social History   Socioeconomic History  . Marital status: Married    Spouse name: Not on file  . Number of children: Not on file  . Years of education: Not on file  . Highest education level: Not on file  Occupational History  . 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 Smoking status: Current Every Day Smoker    Packs/day: 0.50    Years: 13.00    Pack years: 6.50    Types: Cigarettes  . Smokeless tobacco: Never Used  Substance and Sexual Activity  . Alcohol use: Yes    Alcohol/week: 2.0 standard drinks    Types: 2 Standard drinks or equivalent per week  . Drug use: No  . Sexual activity: Yes  Other Topics Concern  . Not on file  Social History Narrative   Regular Exercise -  NO. Married.    Education: [employer].      Social Determinants of Health   Financial Resource Strain:   . Difficulty of Paying Living Expenses:   Food Insecurity:   . Worried About Charity fundraiser in the Last Year:   . Arboriculturist in the Last Year:   Transportation Needs:   . Film/video editor (Medical):   Marland Kitchen Lack of Transportation (Non-Medical):   Physical  Activity:   . Days of Exercise per Week:   . Minutes of Exercise per Session:   Stress:   . Feeling of Stress :   Social Connections:   . Frequency of Communication with Friends and Family:   . Frequency of Social Gatherings with Friends and Family:   . Attends Religious Services:   . Active Member of Clubs or Organizations:   . Attends Archivist Meetings:   Marland Kitchen Marital Status:   Intimate Partner Violence:   . Fear of Current or Ex-Partner:   . Emotionally Abused:   Marland Kitchen Physically Abused:   .  Sexually Abused:     Review of Systems  Constitutional: Negative for fatigue and unexpected weight change.  Eyes: Negative for visual disturbance.  Respiratory: Negative for cough, chest tightness and shortness of breath.   Cardiovascular: Negative for chest pain, palpitations and leg swelling.  Gastrointestinal: Negative for blood in stool. Abdominal pain: epigastric on occasion.   Musculoskeletal: Positive for arthralgias (l shoulder. ).  Neurological: Negative for dizziness, light-headedness and headaches.     Objective:   Vitals:   07/07/19 0847  BP: 104/69  Pulse: 82  Temp: 98.2 F (36.8 C)  TempSrc: Temporal  SpO2: 97%  Weight: 213 lb (96.6 kg)  Height: 5' 9.5" (1.765 m)     Physical Exam Vitals reviewed.  Constitutional:      Appearance: He is well-developed.  HENT:     Head: Normocephalic and atraumatic.  Eyes:     Pupils: Pupils are equal, round, and reactive to light.  Neck:     Vascular: No carotid bruit or JVD.  Cardiovascular:     Rate and Rhythm: Normal rate and regular rhythm.     Heart sounds: Normal heart sounds. No murmur.  Pulmonary:     Effort: Pulmonary effort is normal.     Breath sounds: Normal breath sounds. No rales.  Abdominal:     General: Abdomen is flat.     Tenderness: There is no abdominal tenderness.  Musculoskeletal:     Comments: C-spine pain-free range of motion.  No bony tenderness Upper extremity strength equal  bilaterally.  Left shoulder full range of motion, full rotator cuff strength.  Minimal discomfort with Hawkins, negative Neer's.  Negative empty can.  Negative lift off.  Skin:    General: Skin is warm and dry.  Neurological:     Mental Status: He is alert and oriented to person, place, and time.        Assessment & Plan:  Ian Salazar is a 59 y.o. male . Type 2 diabetes mellitus with hyperglycemia, without long-term current use of insulin ([place]) - Plan: Hemoglobin A1c  -Has made some diet changes, check A1c, may need adjustment of meds.  Essential hypertension - Plan: Comprehensive metabolic panel, Lipid panel  -Stable, no changes  Mixed hyperlipidemia - Plan: Comprehensive metabolic panel, Lipid panel  -Check labs.  Continue statin.  Need for prophylactic vaccination with combined diphtheria-tetanus-pertussis (DTP) vaccine - Plan: Tdap vaccine greater than or equal to 7yo IM  Left shoulder pain, unspecified chronicity - Plan: Ambulatory referral to Orthopedic Surgery  -Initially thought to be combination of possible radiculopathy versus rotator cuff tendinosis.  Minimal radicular symptoms, primarily appears to be at shoulder at this time.  Possible rotator cuff syndrome, reassuring exam at present.  Refer to orthopedics.  Potential physical therapy versus advanced imaging.  Stop ibuprofen given epigastric symptoms, start Tylenol as needed.  Cigarette nicotine dependence without complication  -Cessation discussed, plans to quit cold [place].  Handout given.  Abdominal pain, epigastric - Plan: Ambulatory referral to Gastroenterology  -Stop ibuprofen, continue omeprazole daily, refer to gastroenterology.  Handout given on trigger foods.  No orders of the defined types were placed in this encounter.  Patient Instructions     Continue omeprazole daily. Stop ibuprofen. Tylenol if needed for pain. See foods to avoid for heartburn.  I will refer you to gastroenterology and  shoulder specialist.   Depending on labs, may need med changes.   Good luck with quitting smoking, let me know if we can help.  Steps to Quit Smoking Smoking tobacco is the leading cause of preventable death. It can affect almost every organ in the body. Smoking puts you and those around you at risk for developing many serious chronic diseases. Quitting smoking can be difficult, but it is one of the best things that you can do for your health. It is never too late to quit. How do I get ready to quit? When you decide to quit smoking, create a plan to help you succeed. Before you quit:  Pick a date to quit. Set a date within the next 2 weeks to give you time to prepare.  Write down the reasons why you are quitting. Keep this list in places where you will see it often.  Tell your family, friends, and co-workers that you are quitting. Support from your loved ones can make quitting easier.  Talk with your health care provider about your options for quitting smoking.  Find out what treatment options are covered by your health insurance.  Identify people, places, things, and activities that make you want to smoke (triggers). Avoid them. What first steps can I take to quit smoking?  Throw away all cigarettes at home, at work, and in your car.  Throw away smoking accessories, such as Scientist, research (medical).  Clean your car. Make sure to empty the ashtray.  Clean your home, including curtains and carpets. What strategies can I use to quit smoking? Talk with your health care provider about combining strategies, such as taking medicines while you are also receiving in-person counseling. Using these two strategies together makes you more likely to succeed in quitting than if you used either strategy on its own.  If you are pregnant or breastfeeding, talk with your health care provider about finding counseling or other support strategies to quit smoking. Do not take medicine to help you quit  smoking unless your health care provider tells you to do so. To quit smoking: Quit right away  Quit smoking completely, instead of gradually reducing how much you smoke over a period of time. Research shows that stopping smoking right away is more successful than gradually quitting.  Attend in-person counseling to help you build problem-solving skills. You are more likely to succeed in quitting if you attend counseling sessions regularly. Even short sessions of 10 minutes can be effective. Take medicine You may take medicines to help you quit smoking. Some medicines require a prescription and some you can purchase over-the-counter. Medicines may have nicotine in them to replace the nicotine in cigarettes. Medicines may:  Help to stop cravings.  Help to relieve withdrawal symptoms. Your health care provider may recommend:  Nicotine patches, gum, or lozenges.  Nicotine inhalers or sprays.  Non-nicotine medicine that is taken by mouth. Find resources Find resources and support systems that can help you to quit smoking and remain smoke-free after you quit. These resources are most helpful when you use them often. They include:  Online chats with a Social worker.  Telephone quitlines.  Printed Furniture conservator/restorer.  Support groups or group counseling.  Text messaging programs.  Mobile phone apps or applications. Use apps that can help you stick to your quit plan by providing reminders, tips, and encouragement. There are many free apps for mobile devices as well as websites. Examples include Quit Guide from the [employer] and smokefree.gov What things can I do to make it easier to quit?   Reach out to your family and friends for support and encouragement. Call telephone quitlines (1-800-QUIT-NOW), reach  out to support groups, or work with a Social worker for support.  Ask people who smoke to avoid smoking around you.  Avoid places that trigger you to smoke, such as bars, parties, or smoke-break  areas at work.  Spend time with people who do not smoke.  Lessen the stress in your life. Stress can be a smoking trigger for some people. To lessen stress, try: ? Exercising regularly. ? Doing deep-breathing exercises. ? Doing yoga. ? Meditating. ? Performing a body scan. This involves closing your eyes, scanning your body from head to toe, and noticing which parts of your body are particularly tense. Try to relax the muscles in those areas. How will I feel when I quit smoking? Day 1 to 3 weeks Within the first 24 hours of quitting smoking, you may start to feel withdrawal symptoms. These symptoms are usually most noticeable 2-3 days after quitting, but they usually do not last for more than 2-3 weeks. You may experience these symptoms:  Mood swings.  Restlessness, anxiety, or irritability.  Trouble concentrating.  Dizziness.  Strong cravings for sugary foods and nicotine.  Mild weight gain.  Constipation.  Nausea.  Coughing or a sore throat.  Changes in how the medicines that you take for unrelated issues work in your body.  Depression.  Trouble sleeping (insomnia). Week 3 and afterward After the first 2-3 weeks of quitting, you may start to notice more positive results, such as:  Improved sense of smell and taste.  Decreased coughing and sore throat.  Slower heart rate.  Lower blood pressure.  Clearer skin.  The ability to breathe more easily.  Fewer sick days. Quitting smoking can be very challenging. Do not get discouraged if you are not successful the first time. Some people need to make many attempts to quit before they achieve long-term success. Do your best to stick to your quit plan, and talk with your health care provider if you have any questions or concerns. Summary  Smoking tobacco is the leading cause of preventable death. Quitting smoking is one of the best things that you can do for your health.  When you decide to quit smoking, create a plan  to help you succeed.  Quit smoking right away, not slowly over a period of time.  When you start quitting, seek help from your health care provider, family, or friends. This information is not intended to replace advice given to you by your health care provider. Make sure you discuss any questions you have with your health care provider. Document Revised: 10/14/2018 Document Reviewed: 04/09/2018 Elsevier Patient Education  2020 [place] for Gastroesophageal Reflux Disease, Adult When you have gastroesophageal reflux disease (GERD), the foods you eat and your eating habits are very important. Choosing the right foods can help ease your discomfort. Think about working with a nutrition specialist (dietitian) to help you make good choices. What are tips for following this plan?  Meals  Choose healthy foods that are low in fat, such as fruits, vegetables, whole grains, low-fat dairy products, and lean meat, fish, and poultry.  Eat small meals often instead of 3 large meals a day. Eat your meals slowly, and in a place where you are relaxed. Avoid bending over or lying down until 2-3 hours after eating.  Avoid eating meals 2-3 hours before bed.  Avoid drinking a lot of liquid with meals.  Cook foods using methods other than frying. Bake, grill, or broil food instead.  Avoid or  limit: ? Chocolate. ? Peppermint or spearmint. ? Alcohol. ? Pepper. ? Black and decaffeinated coffee. ? Black and decaffeinated tea. ? Bubbly (carbonated) soft drinks. ? Caffeinated energy drinks and soft drinks.  Limit high-fat foods such as: ? Fatty meat or fried foods. ? Whole milk, cream, butter, or ice cream. ? Nuts and nut butters. ? Pastries, donuts, and sweets made with butter or shortening.  Avoid foods that cause symptoms. These foods may be different for everyone. Common foods that cause symptoms include: ? Tomatoes. ? Oranges, lemons, and limes. ? Peppers. ? Spicy  food. ? Onions and garlic. ? Vinegar. Lifestyle  Maintain a healthy weight. Ask your doctor what weight is healthy for you. If you need to lose weight, work with your doctor to do so safely.  Exercise for at least 30 minutes for 5 or more days each week, or as told by your doctor.  Wear loose-fitting clothes.  Do not smoke. If you need help quitting, ask your doctor.  Sleep with the head of your bed higher than your feet. Use a wedge under the mattress or blocks under the bed frame to raise the head of the bed. Summary  When you have gastroesophageal reflux disease (GERD), food and lifestyle choices are very important in easing your symptoms.  Eat small meals often instead of 3 large meals a day. Eat your meals slowly, and in a place where you are relaxed.  Limit high-fat foods such as fatty meat or fried foods.  Avoid bending over or lying down until 2-3 hours after eating.  Avoid peppermint and spearmint, caffeine, alcohol, and chocolate. This information is not intended to replace advice given to you by your health care provider. Make sure you discuss any questions you have with your health care provider. Document Revised: 05/12/2018 Document Reviewed: 02/25/2016 Elsevier Patient Education  [employer].   If you have lab work done today you will be contacted with your lab results within the next 2 weeks.  If you have not heard from [place] then please contact us. The fastest way to get your results is to register for My Chart.   IF you received an x-ray today, you will receive an invoice from [facility]. Please contact [facility] at [phone] with questions or concerns regarding your invoice.   IF you received labwork today, you will receive an invoice from [facility]. Please contact [facility] at [phone] with questions or concerns regarding your invoice.   Our billing staff will not be able to assist you with questions regarding bills from these  companies.  You will be contacted with the lab results as soon as they are available. The fastest way to get your results is to activate your My Chart account. Instructions are located on the last page of this paperwork. If you have not heard from [place] regarding the results in 2 weeks, please contact this office.         Signed, Merri Ray, MD [facility]

## 2019-07-24 ENCOUNTER — Ambulatory Visit: Payer: PRIVATE HEALTH INSURANCE | Admitting: Orthopedic Surgery

## 2019-07-24 DIAGNOSIS — M7542 Impingement syndrome of left shoulder: Secondary | ICD-10-CM | POA: Diagnosis not present

## 2019-07-29 ENCOUNTER — Encounter: Payer: Self-pay | Admitting: Orthopedic Surgery

## 2019-07-29 DIAGNOSIS — M7542 Impingement syndrome of left shoulder: Secondary | ICD-10-CM | POA: Diagnosis not present

## 2019-07-29 MED ORDER — METHYLPREDNISOLONE ACETATE 40 MG/ML IJ SUSP
40.0000 mg | INTRAMUSCULAR | Status: AC | PRN
  Administered 2019-07-29: 40 mg via INTRA_ARTICULAR

## 2019-07-29 MED ORDER — BUPIVACAINE HCL 0.5 % IJ SOLN
9.0000 mL | INTRAMUSCULAR | Status: AC | PRN
  Administered 2019-07-29: 9 mL via INTRA_ARTICULAR

## 2019-07-29 MED ORDER — LIDOCAINE HCL 1 % IJ SOLN
5.0000 mL | INTRAMUSCULAR | Status: AC | PRN
  Administered 2019-07-29: 5 mL

## 2019-07-29 NOTE — Progress Notes (Signed)
Office Visit Note   Patient: Ian Salazar           Date of Birth: 04/10/1960           MRN: [id number] Visit Date: 07/24/2019 Requested by: Wendie Agreste, MD [address] [place],  [place] [address] PCP: Wendie Agreste, MD  Subjective: Chief Complaint  Patient presents with  . Left Shoulder - Pain    HPI: Ian Salazar is a 59 y.o. male who presents to the office complaining of left shoulder pain.  Patient notes on and off pain for the last 25 years.  He has had worsening pain the last 3 weeks.  He has occasional radiation of pain from the left shoulder down to the elbow.  He denies any neck pain or subjective weakness.  He "just has pain".  Pain is worse with overhead motion and with lifting.  He has no history of shoulder surgery or shoulder injection.  He does have occasional numbness and tingling in his fingers but no radicular pain from his neck.  No history of injury.  He has history of diabetes with last A1c 6.7.  No right-sided symptoms.  Pain does not wake him up at night.  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              ROS:  All systems reviewed are negative as they relate to the chief complaint within the history of present illness.  Patient denies fevers or chills.  Assessment & Plan: Visit Diagnoses:  1. Impingement syndrome of left shoulder     Plan:  Patient is a 59 year old male who presents complaining of left shoulder pain.  He has had on and off shoulder pain for 25 years.  He has had worse shoulder pain in the last 3 weeks.  Pain seems to be worsening.  He has no subjective weakness and no weakness on exam.  No mechanical symptoms.  Pain is his main complaint.  No crepitus on exam.  He does have positive impingement sign.  Plan for left shoulder injection today into the subacromial space.  Radiographs from March of this year reviewed unremarkable..  Follow-up in 6 weeks for  clinical recheck.  Consider MRI at that time if no improvement.  Follow-Up Instructions: No follow-ups on file.   Orders:  No orders of the defined types were placed in this encounter.  No orders of the defined types were placed in this encounter.     Procedures: Large Joint Inj: L subacromial bursa on 07/29/2019 5:07 PM Indications: diagnostic evaluation and pain Details: 18 G 1.5 in needle, posterior approach  Arthrogram: No  Medications: 9 mL bupivacaine 0.5 %; 40 mg methylPREDNISolone acetate 40 MG/ML; 5 mL lidocaine 1 % Outcome: tolerated well, no immediate complications Procedure, treatment alternatives, risks and benefits explained, specific risks discussed. Consent was given by the patient. Immediately prior to procedure a time out was called to verify the correct patient, procedure, equipment, support staff and site/side marked as required. Patient was prepped and draped in the usual sterile fashion.       Clinical Data: No additional findings.  Objective: Vital Signs: There were no vitals taken for this visit.  Physical Exam:  Constitutional: Patient appears well-developed HEENT:  Head: Normocephalic Eyes:EOM are normal Neck: Normal range of motion Cardiovascular: Normal rate Pulmonary/chest: Effort normal Neurologic: Patient is alert Skin: Skin is warm Psychiatric:  Patient has normal mood and affect  Ortho Exam:  Left shoulder Exam Able to forward flex and abduct shoulder overhead No loss of ER relative to the other shoulder.  Good endpoint with ER No TTP over the [facility] joint.  Mild tenderness palpation over the bicipital groove Good subscapularis, supraspinatus, and infraspinatus strength Positive Hawkins impingement 5/5 grip strength, forearm pronation/supination, and bicep strength  Specialty Comments:  No specialty comments available.  Imaging: No results found.   PMFS History: Patient Active Problem List   Diagnosis Date Noted  . Uncontrolled  type 2 diabetes mellitus with insulin therapy ([place]) 04/22/2017  . Hx of adenomatous colonic polyps 03/28/2015  . Routine general medical examination at a health care facility 07/17/2011  . Mixed hyperlipidemia 07/17/2011  . Abnormal chest x-ray 07/17/2011  . Vitamin B 12 deficiency 04/22/2010  . ANEMIA-NOS 04/18/2010  . TOBACCO USE 04/18/2010  . DEGENERATIVE JOINT DISEASE, KNEE 10/24/2008  . DM w/o complication type II 27/04/5007   Past Medical History:  Diagnosis Date  . Abnormal EKG    Normal ETT 5 years ago  . Anemia   . GERD (gastroesophageal reflux disease)    occasional  . Hx of adenomatous colonic polyps 03/28/2015  . Knee pain, right   . Rotator cuff tendonitis    Left  . Type II or unspecified type diabetes mellitus without mention of complication, not stated as uncontrolled     Family History  Problem Relation Age of Onset  . Heart disease Sister   . Diabetes Sister   . Cancer Mother   . Diabetes Father   . Diabetes Brother   . Diabetes Sister   . Diabetes Other        1st degree relative  . Diabetes Maternal Grandmother   . Alcohol abuse Neg Hx   . Stroke Neg Hx   . Hypertension Neg Hx   . Hyperlipidemia Neg Hx   . Colon cancer Neg Hx   . Colon polyps Neg Hx     Past Surgical History:  Procedure Laterality Date  . MULTIPLE TOOTH EXTRACTIONS     Social History   Occupational History  . 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 Smoking status: Current Every Day Smoker    Packs/day: 0.50    Years: 13.00    Pack years: 6.50    Types: Cigarettes  . Smokeless tobacco: Never Used  Substance and Sexual Activity  . Alcohol use: Yes    Alcohol/week: 2.0 standard drinks    Types: 2 Standard drinks or equivalent per week  . Drug use: No  . Sexual activity: Yes

## 2019-09-04 ENCOUNTER — Ambulatory Visit (INDEPENDENT_AMBULATORY_CARE_PROVIDER_SITE_OTHER): Payer: PRIVATE HEALTH INSURANCE | Admitting: Orthopedic Surgery

## 2019-09-04 DIAGNOSIS — M7542 Impingement syndrome of left shoulder: Secondary | ICD-10-CM | POA: Diagnosis not present

## 2019-09-07 ENCOUNTER — Ambulatory Visit (INDEPENDENT_AMBULATORY_CARE_PROVIDER_SITE_OTHER): Payer: PRIVATE HEALTH INSURANCE | Admitting: Internal Medicine

## 2019-09-07 ENCOUNTER — Encounter: Payer: Self-pay | Admitting: Internal Medicine

## 2019-09-07 VITALS — BP 104/60 | HR 88 | Ht 68.25 in | Wt 210.5 lb

## 2019-09-07 DIAGNOSIS — R1013 Epigastric pain: Secondary | ICD-10-CM | POA: Diagnosis not present

## 2019-09-07 DIAGNOSIS — R634 Abnormal weight loss: Secondary | ICD-10-CM | POA: Diagnosis not present

## 2019-09-07 DIAGNOSIS — R1012 Left upper quadrant pain: Secondary | ICD-10-CM

## 2019-09-07 NOTE — Progress Notes (Signed)
Ian Salazar 59 y.o. 07-Dec-1960 [id number]  Assessment & Plan:   Encounter Diagnoses  Name Primary?  . Abdominal pain, epigastric Yes  . LUQ pain   . Loss of weight    Pain is responding to omeprazole though not completely, could dose escalate but he is lost some weight and I think it makes sense to evaluate w/ EGD.  Some intermittent NSAID use noted.  Off PPI x 2 weeks before to increase accuracy of H pylori testing  Depending upon what the EGD shows and clinical course he could need cross-sectional imaging like CT scanning, this does not sound like a biliary or gallbladder problem to me.   I appreciate the opportunity to care for this patient. CC: Wendie Agreste, MD    Subjective:   Chief Complaint: abdominal pain x years  HPI The patient is a 59 year old African-American man with episodic intermittent but persistent epigastric and left upper quadrant pain for years.  Comes and goes often after eating.  Sometimes after smoking he says.  He has never had this evaluated with testing that I can see, LFTs are normal, he was treated for heartburn symptoms with omeprazole 20 mg daily by Dr. Nyoka Cowden his primary care provider that helps he says about 65% of his symptoms are gone.  Almost no heartburn at this time but he continues to have this epigastric pain that bothers him.  He has been losing weight red this is see below) but he has been intentionally trying to reduce caloric intake and not eat after 8 PM when he can.  He also had a hemoglobin A1c of 7.5% in February that is improved down to 6.7%.  It was 8 in November 2020 and 7.8 in June 2020.  No dysphagia, appetite is good some bloating and gas at times.  He has used some ibuprofen off and on for left shoulder pain per orthopedic note June 2021 had a left shoulder injection 07/29/2019.  He notes that if he drinks coffee regularly he sometimes struggles with defecation but otherwise bowel habits are regular and when he stops the  coffee for a while he gets more regular with defecation. Wt Readings from Last 3 Encounters:  09/07/19 210 lb 8 oz (95.5 kg)  07/07/19 213 lb (96.6 kg)  04/03/19 224 lb (101.6 kg)  trying to not eat after 8 P  Screening colonoscopy 03/25/2015 with 2 diminutive adenomas  No recent CBC he was not anemic in 2016 No Known Allergies Current Meds  Medication Sig  . atorvastatin (LIPITOR) 20 MG tablet Take 1 tablet (20 mg total) by mouth daily.  . blood glucose meter kit and supplies KIT Dispense based on patient and insurance preference. Check your fasting blood sugars daily. (FOR ICD-9 250.00, 250.01).  Marland Kitchen empagliflozin (JARDIANCE) 10 MG TABS tablet Take 10 mg by mouth daily.  Marland Kitchen glucose blood (ONE TOUCH ULTRA TEST) test strip USE TO TEST FASTING BLOOD SUGARS DAILY  . lisinopril (ZESTRIL) 10 MG tablet Take 1 tablet (10 mg total) by mouth daily.  . metFORMIN (GLUCOPHAGE) 1000 MG tablet Take 1 tablet (1,000 mg total) by mouth 2 (two) times daily with a meal.  . omeprazole (PRILOSEC) 20 MG capsule TAKE 1 CAPSULE(20 MG) BY MOUTH DAILY  . ONETOUCH DELICA LANCETS 88E MISC USE TO TEST FASTING BLOOD SUGARS DAILY   Past Medical History:  Diagnosis Date  . Abnormal EKG    Normal ETT 5 years ago  . Anemia   . GERD (gastroesophageal reflux  disease)    occasional  . HTN (hypertension)   . Hx of adenomatous colonic polyps 03/28/2015  . Hyperlipidemia   . Knee pain, right   . Rotator cuff tendonitis    Left  . Type II or unspecified type diabetes mellitus without mention of complication, not stated as uncontrolled    Past Surgical History:  Procedure Laterality Date  . COLONOSCOPY W/ POLYPECTOMY  03/25/2015  . MULTIPLE TOOTH EXTRACTIONS     Social History   Social History Narrative   Married.  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    Education: [employer].    Cigarette smoker, no drug use no alcohol since 23 18-19      family history includes Cancer in his mother; Diabetes in his brother, father, maternal grandmother, sister, sister, and another family member; Heart disease in his sister; Hypertension in his mother.   Review of Systems As per HPI.  Left shoulder pain chronic off and on otherwise negative.  Objective:   Physical Exam BP 104/60 (BP Location: Left Arm, Patient Position: Sitting, Cuff Size: Normal)   Pulse 88   Ht 5' 8.25" (1.734 m) Comment: height measured without shoes  Wt 210 lb 8 oz (95.5 kg)   BMI 31.77 kg/m  Well-developed well-nourished no acute distress, black man middle-aged appearing appropriate for age Eyes anicteric Lungs clear Normal heart sounds Abdomen with mild epigastric and left upper quadrant tenderness to deep palpation without organomegaly or mass there is no bony rib tenderness bowel sounds present and no bruits and no hepatosplenomegaly detected No cyanosis clubbing or edema of the extremities Is alert and oriented x3 with an appropriate mood and affect  Data reviewed includes primary care notes and orthopedic notes and labs in the EMR

## 2019-09-07 NOTE — Patient Instructions (Signed)
You have been scheduled for an endoscopy. Please follow written instructions given to you at your visit today. If you use inhalers (even only as needed), please bring them with you on the day of your procedure.  Make sure and stop your omeprazole 2 weeks prior to your EGD.  You may take Tums, or Malox if you need to.  Hold your diabetic medicines the day of the procedure.   Normal BMI (Body Mass Index- based on height and weight) is between 19 and 25. Your BMI today is Body mass index is 31.77 kg/m. Marland Kitchen Please consider follow up  regarding your BMI with your Primary Care Provider.   I appreciate the opportunity to care for you. Silvano Rusk, MD, [facility]

## 2019-09-08 ENCOUNTER — Encounter: Payer: Self-pay | Admitting: Orthopedic Surgery

## 2019-09-08 NOTE — Progress Notes (Signed)
Office Visit Note   Patient: Ian Salazar           Date of Birth: 1960-02-10           MRN: [id number] Visit Date: 09/04/2019 Requested by: Wendie Agreste, MD [address] [place],  [place] [address] PCP: Wendie Agreste, MD  Subjective: Chief Complaint  Patient presents with  . Left Shoulder - Follow-up    HPI: Ian Salazar is a patient here to follow-up left shoulder injection.  Performed 07/24/2019.  States he is about 80% better.  No pain until he lays on the shoulder or has cold air blowing on it.  No increase in blood glucose from the shot.  He works at a [employer].  He is taking anti-inflammatories.              ROS: All systems reviewed are negative as they relate to the chief complaint within the history of present illness.  Patient denies  fevers or chills.   Assessment & Plan: Visit Diagnoses:  1. Impingement syndrome of left shoulder     Plan: Impression is left shoulder pain with 80% improvement from shoulder injection.  Plan is to continue with anti-inflammatories and below shoulder level rotator cuff strengthening exercises.  Follow-up as needed.  No further intervention needed at this time.  Radiologic or otherwise.  Follow-Up Instructions: No follow-ups on file.   Orders:  No orders of the defined types were placed in this encounter.  No orders of the defined types were placed in this encounter.     Procedures: No procedures performed   Clinical Data: No additional findings.  Objective: Vital Signs: There were no vitals taken for this visit.  Physical Exam:   Constitutional: Patient appears well-developed HEENT:  Head: Normocephalic Eyes:EOM are normal Neck: Normal range of motion Cardiovascular: Normal rate Pulmonary/chest: Effort normal Neurologic: Patient is alert Skin: Skin is warm Psychiatric: Patient has normal mood and affect    Ortho Exam: Ortho exam demonstrates full active and passive range of motion of that left  shoulder.  No restriction of passive external rotation of 15 degrees of abduction left versus right.  Cervical spine range of motion is full.  Motor sensory function to the arm and hand intact.  No masses lymphadenopathy or skin changes noted in that left shoulder girdle region.  Specialty Comments:  No specialty comments available.  Imaging: No results found.   PMFS History: Patient Active Problem List   Diagnosis Date Noted  . Uncontrolled type 2 diabetes mellitus with insulin therapy ([place]) 04/22/2017  . Hx of adenomatous colonic polyps 03/28/2015  . Routine general medical examination at a health care facility 07/17/2011  . Mixed hyperlipidemia 07/17/2011  . Abnormal chest x-ray 07/17/2011  . Vitamin B 12 deficiency 04/22/2010  . ANEMIA-NOS 04/18/2010  . TOBACCO USE 04/18/2010  . DEGENERATIVE JOINT DISEASE, KNEE 10/24/2008  . DM w/o complication type II 98/33/8250   Past Medical History:  Diagnosis Date  . Abnormal EKG    Normal ETT 5 years ago  . Anemia   . GERD (gastroesophageal reflux disease)    occasional  . HTN (hypertension)   . Hx of adenomatous colonic polyps 03/28/2015  . Hyperlipidemia   . Knee pain, right   . Rotator cuff tendonitis    Left  . Type II or unspecified type diabetes mellitus without mention of complication, not stated as uncontrolled     Family History  Problem Relation Age of Onset  . Heart  disease Sister   . Diabetes Sister   . Cancer Mother        stage 4 mets, origin unknown  . Hypertension Mother   . Diabetes Father   . Diabetes Brother   . Diabetes Sister   . Diabetes Other        1st degree relative  . Diabetes Maternal Grandmother   . Alcohol abuse Neg Hx   . Stroke Neg Hx   . Hyperlipidemia Neg Hx   . Colon cancer Neg Hx   . Colon polyps Neg Hx     Past Surgical History:  Procedure Laterality Date  . COLONOSCOPY W/ POLYPECTOMY  03/25/2015  . MULTIPLE TOOTH EXTRACTIONS     Social History   Occupational History  .  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 Smoking status: Current Every Day Smoker    Packs/day: 0.50    Years: 13.00    Pack years: 6.50    Types: Cigarettes  . Smokeless tobacco: Never Used  Vaping Use  . Vaping Use: Never used  Substance and Sexual Activity  . Alcohol use: Not Currently    Comment: 09/07/19-none in 2 1/2 years  . Drug use: No  . Sexual activity: Yes

## 2019-10-06 ENCOUNTER — Ambulatory Visit: Payer: PRIVATE HEALTH INSURANCE | Admitting: Family Medicine

## 2019-10-10 ENCOUNTER — Encounter: Payer: Self-pay | Admitting: Gastroenterology

## 2019-10-17 ENCOUNTER — Other Ambulatory Visit: Payer: Self-pay

## 2019-10-17 ENCOUNTER — Ambulatory Visit (AMBULATORY_SURGERY_CENTER): Payer: PRIVATE HEALTH INSURANCE | Admitting: Gastroenterology

## 2019-10-17 ENCOUNTER — Encounter: Payer: Self-pay | Admitting: Gastroenterology

## 2019-10-17 ENCOUNTER — Encounter: Payer: PRIVATE HEALTH INSURANCE | Admitting: Internal Medicine

## 2019-10-17 VITALS — BP 103/74 | HR 76 | Temp 98.7°F | Resp 15 | Ht 68.0 in | Wt 210.0 lb

## 2019-10-17 DIAGNOSIS — K2970 Gastritis, unspecified, without bleeding: Secondary | ICD-10-CM

## 2019-10-17 DIAGNOSIS — K219 Gastro-esophageal reflux disease without esophagitis: Secondary | ICD-10-CM

## 2019-10-17 DIAGNOSIS — K2980 Duodenitis without bleeding: Secondary | ICD-10-CM | POA: Diagnosis not present

## 2019-10-17 DIAGNOSIS — R1013 Epigastric pain: Secondary | ICD-10-CM

## 2019-10-17 DIAGNOSIS — K3189 Other diseases of stomach and duodenum: Secondary | ICD-10-CM | POA: Diagnosis not present

## 2019-10-17 DIAGNOSIS — R634 Abnormal weight loss: Secondary | ICD-10-CM | POA: Diagnosis not present

## 2019-10-17 DIAGNOSIS — R1012 Left upper quadrant pain: Secondary | ICD-10-CM | POA: Diagnosis not present

## 2019-10-17 DIAGNOSIS — K2950 Unspecified chronic gastritis without bleeding: Secondary | ICD-10-CM | POA: Diagnosis not present

## 2019-10-17 DIAGNOSIS — K2990 Gastroduodenitis, unspecified, without bleeding: Secondary | ICD-10-CM

## 2019-10-17 HISTORY — PX: UPPER GASTROINTESTINAL ENDOSCOPY: SHX188

## 2019-10-17 MED ORDER — SODIUM CHLORIDE 0.9 % IV SOLN: 500.0000 mL | Freq: Once | INTRAVENOUS | Status: DC

## 2019-10-17 MED ORDER — OMEPRAZOLE 20 MG PO CPDR: 20.0000 mg | DELAYED_RELEASE_CAPSULE | Freq: Two times a day (BID) | ORAL | 3 refills | Status: DC

## 2019-10-17 NOTE — Progress Notes (Signed)
Report to PACU, RN, vss, BBS= Clear.  

## 2019-10-17 NOTE — Progress Notes (Signed)
Pt's states no medical or surgical changes since previsit or office visit.  Vitals [place] 

## 2019-10-17 NOTE — Progress Notes (Signed)
Called to room to assist during endoscopic procedure.  Patient ID and intended procedure confirmed with present staff. Received instructions for my participation in the procedure from the performing physician.  

## 2019-10-17 NOTE — Op Note (Signed)
[facility] Patient Name: Ian Salazar Procedure Date: 10/17/2019 3:32 PM MRN: [id number] Endoscopist: Gerrit Heck , MD Age: 59 Referring MD:  Date of Birth: 07-01-1960 Gender: Male Account #: [id number] Procedure:                Upper GI endoscopy Indications:              Epigastric abdominal pain, Abdominal pain in the                            left upper quadrant, Heartburn, Suspected                            esophageal reflux                           He has been holding his omeprazole 20 mg/day x2                            weeks for this procedure. Medicines:                Monitored Anesthesia Care Procedure:                Pre-Anesthesia Assessment:                           - Prior to the procedure, a History and Physical                            was performed, and patient medications and                            allergies were reviewed. The patient's tolerance of                            previous anesthesia was also reviewed. The risks                            and benefits of the procedure and the sedation                            options and risks were discussed with the patient.                            All questions were answered, and informed consent                            was obtained. Prior Anticoagulants: The patient has                            taken no previous anticoagulant or antiplatelet                            agents. ASA Grade Assessment: II - A patient with  mild systemic disease. After reviewing the risks                            and benefits, the patient was deemed in                            satisfactory condition to undergo the procedure.                           After obtaining informed consent, the endoscope was                            passed under direct vision. Throughout the                            procedure, the patient's blood pressure, pulse, and                             oxygen saturations were monitored continuously. The                            Endoscope was introduced through the mouth, and                            advanced to the second part of duodenum. The upper                            GI endoscopy was accomplished without difficulty.                            The patient tolerated the procedure well. Scope In: Scope Out: Findings:                 The examined esophagus was normal.                           The Z-line was regular and was found 42 cm from the                            incisors.                           Localized mild inflammation characterized by                            erythema and a single small linear erosion was                            found in the gastric antrum. Biopsies were taken                            with a cold forceps for Helicobacter pylori                            testing. Estimated blood  loss was minimal.                           The gastric fundus and gastric body were normal.                            Additional mucosal biopsies were taken with a cold                            forceps for Helicobacter pylori testing. Estimated                            blood loss was minimal.                           Localized mild inflammation characterized by                            congestion (edema) and erythema was found in the                            duodenal bulb. Biopsies were taken with a cold                            forceps for histology. Estimated blood loss was                            minimal.                           The second portion of the duodenum was normal. Complications:            No immediate complications. Estimated Blood Loss:     Estimated blood loss was minimal. Impression:               - Normal esophagus.                           - Z-line regular, 42 cm from the incisors.                           - Gastritis. Biopsied.                           - Normal gastric  fundus and gastric body. Biopsied.                           - Duodenitis. Biopsied.                           - Normal second portion of the duodenum. Recommendation:           - Patient has a contact number available for                            emergencies. The signs and symptoms of potential  delayed complications were discussed with the                            patient. Return to normal activities tomorrow.                            Written discharge instructions were provided to the                            patient.                           - Resume previous diet.                           - Continue present medications.                           - Await pathology results.                           - Restart Prilosec (omeprazole) at 20 mg PO BID for                            6 weeks to promote mucosal healing, then reduc back                            to 20 mg/day for ongoing control of reflux symptoms.                           - Follow-up with Dr. Carlean Purl in the GI clinic in                            2-3 months at appointment to be scheduled. Gerrit Heck, MD 10/17/2019 3:56:36 PM

## 2019-10-17 NOTE — Patient Instructions (Signed)
Handout given:  Gastritis Resume previous diet  continue present medications Await pathology results RESTART Prilosec(omeprazole) at 20mg  by mouth twice daily to promote mucosal healing, then reduce back to 20mg  once daily for ongoing control of reflux symptoms Follow up with dr. Carlean Purl in the GI clinic in 2-3 months  YOU HAD AN ENDOSCOPIC PROCEDURE TODAY AT [facility]:   Refer to the procedure report that was given to you for any specific questions about what was found during the examination.  If the procedure report does not answer your questions, please call your gastroenterologist to clarify.  If you requested that your care partner not be given the details of your procedure findings, then the procedure report has been included in a sealed envelope for you to review at your convenience later.  YOU SHOULD EXPECT: Some feelings of bloating in the abdomen. Passage of more gas than usual.  Walking can help get rid of the air that was put into your GI tract during the procedure and reduce the bloating. If you had a lower endoscopy (such as a colonoscopy or flexible sigmoidoscopy) you may notice spotting of blood in your stool or on the toilet paper. If you underwent a bowel prep for your procedure, you may not have a normal bowel movement for a few days.  Please Note:  You might notice some irritation and congestion in your nose or some drainage.  This is from the oxygen used during your procedure.  There is no need for concern and it should clear up in a day or so.  SYMPTOMS TO REPORT IMMEDIATELY:    Following upper endoscopy (EGD)  Vomiting of blood or coffee ground material  New chest pain or pain under the shoulder blades  Painful or persistently difficult swallowing  New shortness of breath  Fever of 100F or higher  Black, tarry-looking stools  For urgent or emergent issues, a gastroenterologist can be reached at any hour by calling [phone]. Do not use  MyChart messaging for urgent concerns.    DIET:  We do recommend a small meal at first, but then you may proceed to your regular diet.  Drink plenty of fluids but you should avoid alcoholic beverages for 24 hours.  ACTIVITY:  You should plan to take it easy for the rest of today and you should NOT DRIVE or use heavy machinery until tomorrow (because of the sedation medicines used during the test).    FOLLOW UP: Our staff will call the number listed on your records 48-72 hours following your procedure to check on you and address any questions or concerns that you may have regarding the information given to you following your procedure. If we do not reach you, we will leave a message.  We will attempt to reach you two times.  During this call, we will ask if you have developed any symptoms of COVID 19. If you develop any symptoms (ie: fever, flu-like symptoms, shortness of breath, cough etc.) before then, please call [phone].  If you test positive for Covid 19 in the 2 weeks post procedure, please call and report this information to [place].    If any biopsies were taken you will be contacted by phone or by letter within the next 1-3 weeks.  Please call us at [phone] if you have not heard about the biopsies in 3 weeks.    SIGNATURES/CONFIDENTIALITY: You and/or your care partner have signed paperwork which will be entered into your electronic medical record.  These signatures attest to the fact that that the information above on your After Visit Summary has been reviewed and is understood.  Full responsibility of the confidentiality of this discharge information lies with you and/or your care-partner.

## 2019-10-19 ENCOUNTER — Telehealth: Payer: Self-pay

## 2019-10-19 NOTE — Telephone Encounter (Signed)
  Follow up Call-  Call back number 10/17/2019  Post procedure Call Back phone  # [phone]  Permission to leave phone message Yes  Some recent data might be hidden     Patient questions:  Do you have a fever, pain , or abdominal swelling? No. Pain Score  0 *  Have you tolerated food without any problems? Yes.    Have you been able to return to your normal activities? Yes.    Do you have any questions about your discharge instructions: Diet   No. Medications  No. Follow up visit  No.  Do you have questions or concerns about your Care? No.  Actions: * If pain score is 4 or above: No action needed, pain <4.   1. Have you developed a fever since your procedure? No   2.   Have you had an respiratory symptoms (SOB or cough) since your procedure? No   3.   Have you tested positive for COVID 19 since your procedure? No   4.   Have you had any family members/close contacts diagnosed with the COVID 19 since your procedure?  No    If yes to any of these questions please route to Joylene John, RN and Joella Prince, RN

## 2019-10-20 ENCOUNTER — Encounter: Payer: PRIVATE HEALTH INSURANCE | Admitting: Internal Medicine

## 2019-10-27 ENCOUNTER — Other Ambulatory Visit: Payer: Self-pay

## 2019-10-27 ENCOUNTER — Ambulatory Visit: Payer: PRIVATE HEALTH INSURANCE | Admitting: Family Medicine

## 2019-10-27 VITALS — BP 104/69 | HR 96 | Temp 98.1°F | Resp 16 | Ht 68.0 in | Wt 219.4 lb

## 2019-10-27 DIAGNOSIS — I10 Essential (primary) hypertension: Secondary | ICD-10-CM

## 2019-10-27 DIAGNOSIS — E1165 Type 2 diabetes mellitus with hyperglycemia: Secondary | ICD-10-CM | POA: Diagnosis not present

## 2019-10-27 DIAGNOSIS — E782 Mixed hyperlipidemia: Secondary | ICD-10-CM | POA: Diagnosis not present

## 2019-10-27 DIAGNOSIS — Z23 Encounter for immunization: Secondary | ICD-10-CM | POA: Diagnosis not present

## 2019-10-27 LAB — POCT GLYCOSYLATED HEMOGLOBIN (HGB A1C): Hemoglobin A1C: 6.8 % — AB (ref 4.0–5.6)

## 2019-10-27 LAB — GLUCOSE, POCT (MANUAL RESULT ENTRY): POC Glucose: 126 mg/dl — AB (ref 70–99)

## 2019-10-27 MED ORDER — ATORVASTATIN CALCIUM 20 MG PO TABS: 20.0000 mg | ORAL_TABLET | Freq: Every day | ORAL | 2 refills | Status: DC

## 2019-10-27 MED ORDER — LISINOPRIL 10 MG PO TABS: 10.0000 mg | ORAL_TABLET | Freq: Every day | ORAL | 2 refills | Status: DC

## 2019-10-27 MED ORDER — METFORMIN HCL 1000 MG PO TABS: 1000.0000 mg | ORAL_TABLET | Freq: Two times a day (BID) | ORAL | 2 refills | Status: DC

## 2019-10-27 NOTE — Progress Notes (Signed)
Subjective:  Patient ID: Ian Salazar, male    DOB: 10-30-1960  Age: 59 y.o. MRN: [id number]  CC:  Chief Complaint  Patient presents with  . Diabetes    pt here for 3 month recheck doing well no concerns would like his endoscopy results explained however    HPI ORVIL FARAONE presents for   Diabetes: With history of hyperglycemia.  Treated with Jardiance 10 mg, Metformin 1000 mg twice daily.  He is on ACE inhibitor and statin. Off jardiance for past month.  Made some diet changes. Walking dog for exercise.   Wt Readings from Last 3 Encounters:  10/27/19 219 lb 6.4 oz (99.5 kg)  10/17/19 210 lb (95.3 kg)  09/07/19 210 lb 8 oz (95.5 kg)  Stable A1c today at 6.8. Microalbumin: Normal microalbumin ratio in June 2020. No symptomatic lows.  118 before endoscopy.  Flu shot today.  Optho, foot exam, pneumovax: up to date.   Lab Results  Component Value Date   HGBA1C 6.8 (A) 10/27/2019   HGBA1C 6.7 (H) 07/07/2019   HGBA1C 7.5 (H) 03/17/2019   Lab Results  Component Value Date   LDLCALC 43 07/07/2019   CREATININE 1.01 07/07/2019   Lab Results  Component Value Date   CREATININE 1.01 07/07/2019   Hyperlipidemia: lipitor 36m qd.  No new myalgias/side effects. Lab Results  Component Value Date   CHOL 99 (L) 07/07/2019   HDL 45 07/07/2019   LDLCALC 43 07/07/2019   TRIG 42 07/07/2019   CHOLHDL 2.2 07/07/2019   Lab Results  Component Value Date   ALT 14 07/07/2019   AST 14 07/07/2019   ALKPHOS 62 07/07/2019   BILITOT 0.3 07/07/2019       History Patient Active Problem List   Diagnosis Date Noted  . Uncontrolled type 2 diabetes mellitus with insulin [place] 04/22/2017  . Hx of adenomatous colonic polyps 03/28/2015  . Routine general medical examination at a health care facility 07/17/2011  . Mixed hyperlipidemia 07/17/2011  . Abnormal chest x-ray 07/17/2011  . Vitamin B 12 deficiency 04/22/2010  . ANEMIA-NOS 04/18/2010  . TOBACCO USE 04/18/2010    . DEGENERATIVE JOINT DISEASE, KNEE 10/24/2008  . DM w/o complication type II 030/[id number]  Past Medical History:  Diagnosis Date  . Abnormal EKG    Normal ETT 5 years ago  . Anemia   . GERD (gastroesophageal reflux disease)    occasional  . HTN (hypertension)   . Hx of adenomatous colonic polyps 03/28/2015  . Hyperlipidemia   . Knee pain, right   . Rotator cuff tendonitis    Left  . Type II or unspecified type diabetes mellitus without mention of complication, not stated as uncontrolled    Past Surgical History:  Procedure Laterality Date  . COLONOSCOPY W/ POLYPECTOMY  03/25/2015  . MULTIPLE TOOTH EXTRACTIONS    . UPPER GASTROINTESTINAL ENDOSCOPY  10/17/2019   No Known Allergies Prior to Admission medications   Medication Sig Start Date End Date Taking? Authorizing Provider  atorvastatin (LIPITOR) 20 MG tablet Take 1 tablet (20 mg total) by mouth daily. 03/17/19   GWendie Agreste MD  blood glucose meter kit and supplies KIT Dispense based on patient and insurance preference. Check your fasting blood sugars daily. (FOR ICD-9 250.00, 250.01). 12/12/16   MJaynee Eagles PA-C  empagliflozin (JARDIANCE) 10 MG TABS tablet Take 10 mg by mouth daily. 03/17/19   GWendie Agreste MD  glucose blood (ONE TOUCH ULTRA TEST) test strip USE  TO TEST FASTING BLOOD SUGARS DAILY 07/08/18   Wendie Agreste, MD  lisinopril (ZESTRIL) 10 MG tablet Take 1 tablet (10 mg total) by mouth daily. 03/17/19   Wendie Agreste, MD  metFORMIN (GLUCOPHAGE) 1000 MG tablet Take 1 tablet (1,000 mg total) by mouth 2 (two) times daily with a meal. 03/17/19   Wendie Agreste, MD  omeprazole (PRILOSEC) 20 MG capsule TAKE 1 CAPSULE(20 MG) BY MOUTH DAILY Patient not taking: Reported on 10/17/2019 03/17/19   Wendie Agreste, MD  omeprazole (PRILOSEC) 20 MG capsule Take 1 capsule (20 mg total) by mouth 2 (two) times daily before a meal. 10/17/19   Cirigliano, Vito V, DO  ONETOUCH DELICA LANCETS 27P MISC USE TO TEST FASTING  BLOOD SUGARS DAILY 02/23/17   Jaynee Eagles, PA-C   Social History   Socioeconomic History  . Marital status: Married    Spouse name: Not on file  . Number of children: 1  . Years of education: Not on file  . Highest education level: Not on file  Occupational History  . 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 Smoking status: Current Every Day Smoker    Packs/day: 0.50    Years: 13.00    Pack years: 6.50    Types: Cigarettes  . Smokeless tobacco: Never Used  Vaping Use  . Vaping Use: Never used  Substance and Sexual Activity  . Alcohol use: Not Currently    Comment: 09/07/19-none in 2 1/2 years  . Drug use: No  . Sexual activity: Yes  Other Topics Concern  . Not on file  Social History Narrative   Married.  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    Education: [employer].   Cigarette smoker, no drug use no alcohol since 20 18-19      Social Determinants of Health   Financial Resource Strain:   . Difficulty of Paying Living Expenses: Not on file  Food Insecurity:   . Worried About Charity fundraiser in the Last Year: Not on file  . Ran Out of Food in the Last Year: Not on file  Transportation Needs:   . Lack of Transportation (Medical): Not on file  . Lack of Transportation (Non-Medical): Not on file  Physical Activity:   . Days of Exercise per Week: Not on file  . Minutes of Exercise per Session: Not on file  Stress:   . Feeling of Stress : Not on file  Social Connections:   . Frequency of Communication with Friends and Family: Not on file  . Frequency of Social Gatherings with Friends and Family: Not on file  . Attends Religious Services: Not on file  . Active Member of Clubs or Organizations: Not on file  . Attends Archivist Meetings: Not on file  . Marital Status: Not on file  Intimate Partner Violence:   . Fear of  Current or Ex-Partner: Not on file  . Emotionally Abused: Not on file  . Physically Abused: Not on file  . Sexually Abused: Not on file    Review of Systems  Constitutional: Negative for fatigue and unexpected weight change.  Eyes: Negative for visual disturbance.  Respiratory: Negative for cough, chest tightness and shortness of breath.   Cardiovascular: Negative for chest pain, palpitations and leg swelling.  Gastrointestinal: Negative for abdominal pain and  blood in stool.  Neurological: Negative for dizziness, light-headedness and headaches.     Objective:   Vitals:   10/27/19 1614  BP: 104/69  Pulse: 96  Resp: 16  Temp: 98.1 F (36.7 C)  TempSrc: Temporal  SpO2: 99%  Weight: 219 lb 6.4 oz (99.5 kg)  Height: _0  (1.727 m)     Physical Exam Vitals reviewed.  Constitutional:      Appearance: He is well-developed.  HENT:     Head: Normocephalic and atraumatic.  Eyes:     Pupils: Pupils are equal, round, and reactive to light.  Neck:     Vascular: No carotid bruit or JVD.  Cardiovascular:     Rate and Rhythm: Normal rate and regular rhythm.     Heart sounds: Normal heart sounds. No murmur heard.   Pulmonary:     Effort: Pulmonary effort is normal.     Breath sounds: Normal breath sounds. No rales.  Skin:    General: Skin is warm and dry.  Neurological:     Mental Status: He is alert and oriented to person, place, and time.        Assessment & Plan:  RIZWAN KUYPER is a 59 y.o. male . Type 2 diabetes mellitus with hyperglycemia, without long-term current use of insulin (HCC) - Plan: POCT glycosylated hemoglobin (Hb A1C), POCT glucose (manual entry), Microalbumin / creatinine urine ratio  -Stable control.  We will try just Metformin for now, monitoring home readings with 72-monthrecheck, consider restart of Jardiance if elevating.  Essential hypertension - Plan: metFORMIN (GLUCOPHAGE) 1000 MG tablet, lisinopril (ZESTRIL) 10 MG tablet  -Tolerating  current regimen, continue lisinopril.  Mixed hyperlipidemia - Plan: atorvastatin (LIPITOR) 20 MG tablet  -Tolerating statin, continue same.  Labs in June noted.  Meds ordered this encounter  Medications  . metFORMIN (GLUCOPHAGE) 1000 MG tablet    Sig: Take 1 tablet (1,000 mg total) by mouth 2 (two) times daily with a meal.    Dispense:  180 tablet    Refill:  2  . lisinopril (ZESTRIL) 10 MG tablet    Sig: Take 1 tablet (10 mg total) by mouth daily.    Dispense:  90 tablet    Refill:  2  . atorvastatin (LIPITOR) 20 MG tablet    Sig: Take 1 tablet (20 mg total) by mouth daily.    Dispense:  90 tablet    Refill:  2   Patient Instructions     Okay to try off Jardiance for the next 3 months as A1c is stable today, but if you notice blood sugars increasing at home, let me know and we should restart that medicine.    Please follow-up with your gastroenterologist to discuss the biopsy results further.  Continue the heartburn medication as they recommended.  Let me know if there are questions and take care.     If you have lab work done today you will be contacted with your lab results within the next 2 weeks.  If you have not heard from [place] please contact [place] The fastest way to get your results is to register for My Chart.   IF you received an x-ray today, you will [facility]. Please contact [facility] at [phone] questions or concerns regarding your invoice.   IF you received labwork today, you will receive an invoice from [facility] Please contact [facility] at [phone] questions or concerns regarding your invoice.   Our billing  staff will not be able to assist you with questions regarding bills from these companies.  You will be contacted with the lab results as soon as they are available. The fastest way to get your results is to activate your My Chart account. Instructions are located on the last page of this  paperwork. If you have not heard from [place] regarding the results in 2 weeks, please contact this office.         Signed, Merri Ray, MD [facility]

## 2019-10-27 NOTE — Patient Instructions (Addendum)
   Okay to try off Jardiance for the next 3 months as A1c is stable today, but if you notice blood sugars increasing at home, let me know and we should restart that medicine.    Please follow-up with your gastroenterologist to discuss the biopsy results further.  Continue the heartburn medication as they recommended.  Let me know if there are questions and take care.     If you have lab work done today you will be contacted with your lab results within the next 2 weeks.  If you have not heard from [place] then please contact us. The fastest way to get your results is to register for My Chart.   IF you received an x-ray today, you will receive an invoice from [facility]. Please contact [facility] at [phone] with questions or concerns regarding your invoice.   IF you received labwork today, you will receive an invoice from [facility]. Please contact [facility] at [phone] with questions or concerns regarding your invoice.   Our billing staff will not be able to assist you with questions regarding bills from these companies.  You will be contacted with the lab results as soon as they are available. The fastest way to get your results is to activate your My Chart account. Instructions are located on the last page of this paperwork. If you have not heard from [place] regarding the results in 2 weeks, please contact this office.

## 2019-10-28 LAB — MICROALBUMIN / CREATININE URINE RATIO
Creatinine, Urine: 53 mg/dL
Microalb/Creat Ratio: <6 mg/g creat (ref 0–29)
Microalbumin, Urine: <3 ug/mL

## 2019-10-29 ENCOUNTER — Encounter: Payer: Self-pay | Admitting: Family Medicine

## 2019-12-06 ENCOUNTER — Ambulatory Visit (INDEPENDENT_AMBULATORY_CARE_PROVIDER_SITE_OTHER): Payer: PRIVATE HEALTH INSURANCE | Admitting: Gastroenterology

## 2019-12-06 ENCOUNTER — Other Ambulatory Visit: Payer: Self-pay

## 2019-12-06 ENCOUNTER — Encounter: Payer: Self-pay | Admitting: Gastroenterology

## 2019-12-06 VITALS — BP 110/62 | HR 88 | Ht 69.5 in | Wt 218.0 lb

## 2019-12-06 DIAGNOSIS — K31A0 Gastric intestinal metaplasia, unspecified: Secondary | ICD-10-CM | POA: Diagnosis not present

## 2019-12-06 DIAGNOSIS — Z860100 Personal history of colon polyps, unspecified: Secondary | ICD-10-CM

## 2019-12-06 DIAGNOSIS — K219 Gastro-esophageal reflux disease without esophagitis: Secondary | ICD-10-CM | POA: Diagnosis not present

## 2019-12-06 MED ORDER — OMEPRAZOLE 20 MG PO CPDR: 20.0000 mg | DELAYED_RELEASE_CAPSULE | Freq: Every day | ORAL | 5 refills | Status: DC

## 2019-12-06 NOTE — Patient Instructions (Signed)
If you are age 59 or older, your body mass index should be between 23-30. Your Body mass index is 31.73 kg/m. If this is out of the aforementioned range listed, please consider follow up with your Primary Care Provider.  If you are age 75 or younger, your body mass index should be between 19-25. Your Body mass index is 31.73 kg/m. If this is out of the aformentioned range listed, please consider follow up with your Primary Care Provider.   We have sent omeprazole to your pharmacy  Please call us in 1 year to see about scheduling your EGD/Colonoscopy

## 2019-12-06 NOTE — Progress Notes (Signed)
P  Chief Complaint:    GERD, medication refill, procedure follow-up  GI History: 59 year old male with a history of HTN, HLD, diabetes 2, GERD, colon polyps, shoulder pain.  Endoscopic History: -Colonoscopy (03/25/2015): 2 diminutive tubular adenomas -EGD (10/17/2019): Normal esophagus, mild gastritis with single linear erosion in the antrum (gastric intestinal metaplasia), mild peptic duodenitis  HPI:     Patient is a 59 y.o. male presenting to the Gastroenterology Clinic for follow-up.  Last seen by Dr. Carlean Purl on 09/07/2019 for evaluation of epigastric/LUQ pain times years.  Pain intermittent, tends to be postprandial or after smoking.  Pain improved with omeprazole trial.  EGD completed 10/17/19 as above.  Increased omeprazole to 20 mg bid x6 weeks to promote mucosal healing, with plan to reduce back down to 20 mg daily for ongoing reflux control.  Gastric biopsies negative for H. pylori but did demonstrate gastric intestinal metaplasia.  States that he otherwise feels well today.  Reflux well controlled.  Abdominal pain resolved.  Completed 6 weeks of high-dose PPI, and returned to 20 mg/day with continued good control of reflux.  Needs Rx refill today.     Review of systems:     No chest pain, no SOB, no fevers, no urinary sx   Past Medical History:  Diagnosis Date  . Abnormal EKG    Normal ETT 5 years ago  . Anemia   . GERD (gastroesophageal reflux disease)    occasional  . HTN (hypertension)   . Hx of adenomatous colonic polyps 03/28/2015  . Hyperlipidemia   . Knee pain, right   . Rotator cuff tendonitis    Left  . Type II or unspecified type diabetes mellitus without mention of complication, not stated as uncontrolled     Patient's surgical history, family medical history, social history, medications and allergies were all reviewed in Epic    Current Outpatient Medications  Medication Sig Dispense Refill  . atorvastatin (LIPITOR) 20 MG tablet Take 1 tablet (20 mg  total) by mouth daily. 90 tablet 2  . blood glucose meter kit and supplies KIT Dispense based on patient and insurance preference. Check your fasting blood sugars daily. (FOR ICD-9 250.00, 250.01). 1 each 0  . glucose blood (ONE TOUCH ULTRA TEST) test strip USE TO TEST FASTING BLOOD SUGARS DAILY 100 each 11  . lisinopril (ZESTRIL) 10 MG tablet Take 1 tablet (10 mg total) by mouth daily. 90 tablet 2  . metFORMIN (GLUCOPHAGE) 1000 MG tablet Take 1 tablet (1,000 mg total) by mouth 2 (two) times daily with a meal. 180 tablet 2  . ONETOUCH DELICA LANCETS 02O MISC USE TO TEST FASTING BLOOD SUGARS DAILY 100 each 11  . omeprazole (PRILOSEC) 20 MG capsule TAKE 1 CAPSULE(20 MG) BY MOUTH DAILY (Patient not taking: Reported on 12/06/2019) 90 capsule 2   No current facility-administered medications for this visit.    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  Discussed this diagnosis at length, to include current societal guidelines, and discussion of those at  increased risk for development of gastric adenocarcinoma.  His risk factors include the following: Smoking, African-American.  He would like to proceed with the following: -Repeat EGD with GIM mapping protocol in 1 year to establish subtype and extent of disease - In those at increased risk the presence of IM noted, surveillance can be considered every 3 (AGA) to 5 (ESGE) years, and stopped after 2 consecutive mapping studies demonstrate metaplasia without dysplasia (ASGE guideline)  3) History of  colon polyps -Colonoscopy 2017 with 2 small tubular adenomas, with repeat recommended in 5 years -Plan for colonoscopy 2022, which can be done at same time as EGD with GIM mapping as above  The indications, risks, and benefits of EGD and colonoscopy were explained to the patient in detail. Risks include but are not limited to bleeding, perforation, adverse reaction to medications, and cardiopulmonary compromise. Sequelae include but are not limited to the possibility of surgery, hositalization, and mortality. The patient verbalized understanding and wished to proceed.  Will recall to schedule in 2022.           Lavena Bullion ,DO, FACG 12/06/2019, 8:53 AM

## 2020-01-29 ENCOUNTER — Ambulatory Visit: Payer: PRIVATE HEALTH INSURANCE | Admitting: Family Medicine

## 2020-02-09 ENCOUNTER — Other Ambulatory Visit: Payer: Self-pay

## 2020-02-09 ENCOUNTER — Encounter: Payer: Self-pay | Admitting: Family Medicine

## 2020-02-09 ENCOUNTER — Ambulatory Visit: Payer: PRIVATE HEALTH INSURANCE | Admitting: Family Medicine

## 2020-02-09 VITALS — BP 124/75 | HR 95 | Temp 98.7°F | Resp 16 | Ht 69.5 in | Wt 222.4 lb

## 2020-02-09 DIAGNOSIS — M7660 Achilles tendinitis, unspecified leg: Secondary | ICD-10-CM

## 2020-02-09 DIAGNOSIS — S86012A Strain of left Achilles tendon, initial encounter: Secondary | ICD-10-CM

## 2020-02-09 DIAGNOSIS — E1165 Type 2 diabetes mellitus with hyperglycemia: Secondary | ICD-10-CM | POA: Diagnosis not present

## 2020-02-09 NOTE — Progress Notes (Signed)
Patient ID: Ian Salazar, male    DOB: 06-04-1960  Age: 60 y.o. MRN: [id number]  Chief Complaint  Patient presents with  . Diabetes    Pt reports doing well no concerns with diabetes   . Foot Pain    Pt reports started using new machine at work and it has started causing some pains in his ankles and now has small bump on back of his ankle started about 2-3 months ago     Subjective:   Patient is here for recheck of his diabetes.  However his biggest concern is that his left heel has been hurting him.  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  It caused him to be sore when he had depression all day.  About a month ago 1 day he jammed it hard to make it catch and he had acute pain in the left heel.  Since then he has had a swollen nodule on his left Achilles which has been very tender.  He wears hightop shoes for work.  He has been using his right foot to press the pedal on the left, or park it against something where he does not have to slam the brakes on.  The diabetes has been doing well.  Usually his sugars are around 140 he says.  His last labs here were good.  He takes his medicine faithfully. Current allergies, medications, problem list, past/family and social histories reviewed.  Objective:  BP 124/75   Pulse 95   Temp 98.7 F (37.1 C) (Temporal)   Resp 16   Ht 5' 9.5" (1.765 m)   Wt 222 lb 6.4 oz (100.9 kg)   SpO2 99%   BMI 32.37 kg/m   No major acute distress.  Chest clear.  Heart without murmur.  Brief review of systems including questions about dizziness, headaches, chest pain, shortness of breath, or abdominal symptoms were all negative.  His left heel is very tender on the Achilles.  There is a nodule on it.  It is about 3 to 4 cm above the attachment of the Achilles to the heel.  There is no tenderness at the attachment.  It feels like a little cyst on there.  I suspect he may have had a partial tear that bled and formed a cystic  area.  Assessment & Plan:   Assessment: 1. Type 2 diabetes mellitus with hyperglycemia, without long-term current use of insulin ([place])   2. Achilles tendon pain   3. Partial tear of Achilles tendon, left, initial encounter       Plan: See instructions.  He is to call back if he does not hear from the referral.  Also urged him to go ahead and get the booster shot for COVID.  Orders Placed This Encounter  Procedures  . Basic metabolic panel  . Hemoglobin A1c  . Ambulatory referral to Sports Medicine    Referral Priority:   Routine    Referral Type:   Consultation    Number of Visits Requested:   1    No orders of the defined types were placed in this encounter.        Patient Instructions     Continue current diabetes treatment.  Try to lose weight.  Referral being made for evaluation of Achilles.  Get some diclofenac gel OTC and rub into achilles 2-3 times daily.  Use left foot cautiously to avoid other injury.  If you have  lab work done today you will be contacted with your lab results within the next 2 weeks.  If you have not heard from [place] then please contact us. The fastest way to get your results is to register for My Chart.   IF you received an x-ray today, you will receive an invoice from [facility]. Please contact [facility] at [phone] with questions or concerns regarding your invoice.   IF you received labwork today, you will receive an invoice from [facility]. Please contact [facility] at [phone] with questions or concerns regarding your invoice.   Our billing staff will not be able to assist you with questions regarding bills from these companies.  You will be contacted with the lab results as soon as they are available. The fastest way to get your results is to activate your My Chart account. Instructions are located on the last page of this paperwork. If you have not heard from [place] regarding the results in 2 weeks, please  contact this office.        Return in about 4 months (around 06/08/2020), or diabetes, for Dr Carlota Raspberry.   Ruben Reason, MD 02/09/2020

## 2020-02-09 NOTE — Patient Instructions (Addendum)
   Continue current diabetes treatment.  Try to lose weight.  Referral being made for evaluation of Achilles.  Get some diclofenac gel OTC and rub into achilles 2-3 times daily.  Use left foot cautiously to avoid other injury.  If you have lab work done today you will be contacted with your lab results within the next 2 weeks.  If you have not heard from [place] then please contact us. The fastest way to get your results is to register for My Chart.   IF you received an x-ray today, you will receive an invoice from [facility]. Please contact [facility] at [phone] with questions or concerns regarding your invoice.   IF you received labwork today, you will receive an invoice from [place]. Please contact [facility] at [phone] with questions or concerns regarding your invoice.   Our billing staff will not be able to assist you with questions regarding bills from these companies.  You will be contacted with the lab results as soon as they are available. The fastest way to get your results is to activate your My Chart account. Instructions are located on the last page of this paperwork. If you have not heard from [place] regarding the results in 2 weeks, please contact this office.

## 2020-02-10 LAB — HEMOGLOBIN A1C
Est. average glucose Bld gHb Est-mCnc: 163 mg/dL
Hgb A1c MFr Bld: 7.3 % — ABNORMAL HIGH (ref 4.8–5.6)

## 2020-02-10 LAB — BASIC METABOLIC PANEL
BUN/Creatinine Ratio: 13 (ref 9–20)
BUN: 11 mg/dL (ref 6–24)
CO2: 24 mmol/L (ref 20–29)
Calcium: 9.2 mg/dL (ref 8.7–10.2)
Chloride: 103 mmol/L (ref 96–106)
Creatinine, Ser: 0.84 mg/dL (ref 0.76–1.27)
GFR calc Af Amer: 111 mL/min/{1.73_m2} (ref >59)
GFR calc non Af Amer: 96 mL/min/{1.73_m2} (ref >59)
Glucose: 84 mg/dL (ref 65–99)
Potassium: 3.8 mmol/L (ref 3.5–5.2)
Sodium: 141 mmol/L (ref 134–144)

## 2020-03-07 DIAGNOSIS — M79672 Pain in left foot: Secondary | ICD-10-CM | POA: Insufficient documentation

## 2020-04-01 ENCOUNTER — Other Ambulatory Visit: Payer: Self-pay | Admitting: Family Medicine

## 2020-04-01 DIAGNOSIS — I10 Essential (primary) hypertension: Secondary | ICD-10-CM

## 2020-04-01 DIAGNOSIS — E782 Mixed hyperlipidemia: Secondary | ICD-10-CM

## 2020-04-16 ENCOUNTER — Other Ambulatory Visit: Payer: Self-pay | Admitting: Family Medicine

## 2020-04-16 DIAGNOSIS — I10 Essential (primary) hypertension: Secondary | ICD-10-CM

## 2020-06-07 ENCOUNTER — Ambulatory Visit: Payer: Self-pay | Admitting: Family Medicine

## 2020-08-09 ENCOUNTER — Encounter: Payer: Self-pay | Admitting: Family Medicine

## 2020-08-09 ENCOUNTER — Ambulatory Visit (INDEPENDENT_AMBULATORY_CARE_PROVIDER_SITE_OTHER): Payer: PRIVATE HEALTH INSURANCE | Admitting: Family Medicine

## 2020-08-09 ENCOUNTER — Other Ambulatory Visit: Payer: Self-pay

## 2020-08-09 VITALS — BP 124/78 | HR 93 | Temp 98.2°F | Resp 16 | Ht 69.5 in | Wt 211.4 lb

## 2020-08-09 DIAGNOSIS — E1165 Type 2 diabetes mellitus with hyperglycemia: Secondary | ICD-10-CM | POA: Diagnosis not present

## 2020-08-09 DIAGNOSIS — E782 Mixed hyperlipidemia: Secondary | ICD-10-CM | POA: Diagnosis not present

## 2020-08-09 DIAGNOSIS — Z23 Encounter for immunization: Secondary | ICD-10-CM | POA: Diagnosis not present

## 2020-08-09 DIAGNOSIS — I10 Essential (primary) hypertension: Secondary | ICD-10-CM | POA: Diagnosis not present

## 2020-08-09 LAB — LIPID PANEL
Cholesterol: 111 mg/dL (ref 0–200)
HDL: 44.3 mg/dL (ref >39.00)
LDL Cholesterol: 57 mg/dL (ref 0–99)
NonHDL: 66.24
Total CHOL/HDL Ratio: 2
Triglycerides: 48 mg/dL (ref 0.0–149.0)
VLDL: 9.6 mg/dL (ref 0.0–40.0)

## 2020-08-09 LAB — COMPREHENSIVE METABOLIC PANEL
ALT: 15 U/L (ref 0–53)
AST: 13 U/L (ref 0–37)
Albumin: 4.4 g/dL (ref 3.5–5.2)
Alkaline Phosphatase: 56 U/L (ref 39–117)
BUN: 17 mg/dL (ref 6–23)
CO2: 25 mEq/L (ref 19–32)
Calcium: 9.5 mg/dL (ref 8.4–10.5)
Chloride: 104 mEq/L (ref 96–112)
Creatinine, Ser: 0.89 mg/dL (ref 0.40–1.50)
GFR: 93.26 mL/min (ref >60.00)
Glucose, Bld: 145 mg/dL — ABNORMAL HIGH (ref 70–99)
Potassium: 3.9 mEq/L (ref 3.5–5.1)
Sodium: 139 mEq/L (ref 135–145)
Total Bilirubin: 0.3 mg/dL (ref 0.2–1.2)
Total Protein: 7 g/dL (ref 6.0–8.3)

## 2020-08-09 LAB — HEMOGLOBIN A1C: Hgb A1c MFr Bld: 7.3 % — ABNORMAL HIGH (ref 4.6–6.5)

## 2020-08-09 MED ORDER — METFORMIN HCL 1000 MG PO TABS: ORAL_TABLET | ORAL | 2 refills | Status: DC

## 2020-08-09 MED ORDER — ATORVASTATIN CALCIUM 20 MG PO TABS: 20.0000 mg | ORAL_TABLET | Freq: Every day | ORAL | 2 refills | Status: DC

## 2020-08-09 MED ORDER — LISINOPRIL 10 MG PO TABS: 10.0000 mg | ORAL_TABLET | Freq: Every day | ORAL | 2 refills | Status: DC

## 2020-08-09 NOTE — Progress Notes (Signed)
Subjective:  Patient ID: Ian Salazar, male    DOB: 03-12-1960  Age: 60 y.o. MRN: [id number]  CC:  Chief Complaint  Patient presents with   Hypertension    Pt here for maintenance medication review and check.    Diabetes    Pt here for recheck and refill metformin and other supplies, also due for recheck of cholesterol and refill of atorvastatin    Immunizations    Pt is requesting the shingles vaccine as well as the pneumonia vaccine if recommended last given was pneumo 23 on 12/14/2018    HPI [name] presents for   Hypertension: Lisinopril 10 mg daily. No new side effects with meds.  Home readings: none.  BP Readings from Last 3 Encounters:  08/09/20 124/78  02/09/20 124/75  12/06/19 110/62   Lab Results  Component Value Date   CREATININE 0.84 02/09/2020   Diabetes: With hyperglycemia, last A1c increased from 6.8-7.3 in January.  Seen by my colleague at that time, recommended weight loss, continued on metformin 1000 mg twice daily.  He is on ACE inhibitor and statin. No recent home readings. No side effects with meds.  Needs new tests strips - he will call with brand.  Microalbumin: Normal ratio 10/27/2019 Optho, foot exam, pneumovax: Up-to-date.  Received Pneumovax 12/14/2018. Plan on PCV 20 today.  Plan for shingrix as well today.  Prior achilles pain resolved.  Has worked on diet - avoiding late meals  Wt Readings from Last 3 Encounters:  08/09/20 211 lb 6.4 oz (95.9 kg)  02/09/20 222 lb 6.4 oz (100.9 kg)  12/06/19 218 lb (98.9 kg)    Lab Results  Component Value Date   HGBA1C 7.3 (H) 02/09/2020   HGBA1C 6.8 (A) 10/27/2019   HGBA1C 6.7 (H) 07/07/2019   Lab Results  Component Value Date   LDLCALC 43 07/07/2019   CREATININE 0.84 02/09/2020    Immunization History  Administered Date(s) Administered   Influenza Whole 11/15/2008, 04/18/2010   Influenza,inj,Quad PF,6+ Mos 12/14/2018, 10/27/2019   Influenza-Unspecified 11/02/2014, 11/02/2016    PFIZER(Purple Top)SARS-COV-2 Vaccination 04/27/2019, 05/18/2019   Pneumococcal Polysaccharide-23 12/14/2018   Td 11/15/2008   Tdap 07/07/2019   Hyperlipidemia: Lipitor 50m qd. No new side effects/myalgias  Lab Results  Component Value Date   CHOL 99 (L) 07/07/2019   HDL 45 07/07/2019   LDLCALC 43 07/07/2019   TRIG 42 07/07/2019   CHOLHDL 2.2 07/07/2019   Lab Results  Component Value Date   ALT 14 07/07/2019   AST 14 07/07/2019   ALKPHOS 62 07/07/2019   BILITOT 0.3 07/07/2019     History Patient Active Problem List   Diagnosis Date Noted   Uncontrolled type 2 diabetes mellitus with insulin therapy ([place] 04/22/2017   Hx of adenomatous colonic polyps 03/28/2015   Routine general medical examination at a health care facility 07/17/2011   Mixed hyperlipidemia 07/17/2011   Abnormal chest x-ray 07/17/2011   Vitamin B 12 deficiency 04/22/2010   ANEMIA-NOS 04/18/2010   TOBACCO USE 04/18/2010   DEGENERATIVE JOINT DISEASE, KNEE 002/72/5366  DM w/o complication type II 044/04/4740  Past Medical History:  Diagnosis Date   Abnormal EKG    Normal ETT 5 years ago   Anemia    GERD (gastroesophageal reflux disease)    occasional   HTN (hypertension)    Hx of adenomatous colonic polyps 03/28/2015   Hyperlipidemia    Knee pain, right    Rotator cuff tendonitis    Left  Type II or unspecified type diabetes mellitus without mention of complication, not stated as uncontrolled    Past Surgical History:  Procedure Laterality Date   COLONOSCOPY W/ POLYPECTOMY  03/25/2015   MULTIPLE TOOTH EXTRACTIONS     UPPER GASTROINTESTINAL ENDOSCOPY  10/17/2019   No Known Allergies Prior to Admission medications   Medication Sig Start Date End Date Taking? Authorizing Provider  atorvastatin (LIPITOR) 20 MG tablet Take 1 tablet (20 mg total) by mouth daily. 10/27/19  Yes Wendie Agreste, MD  blood glucose meter kit and supplies KIT Dispense based on patient and insurance preference. Check  your fasting blood sugars daily. (FOR ICD-9 250.00, 250.01). 12/12/16  Yes Jaynee Eagles, PA-C  glucose blood (ONE TOUCH ULTRA TEST) test strip USE TO TEST FASTING BLOOD SUGARS DAILY 07/08/18  Yes Wendie Agreste, MD  lisinopril (ZESTRIL) 10 MG tablet Take 1 tablet (10 mg total) by mouth daily. 10/27/19  Yes Wendie Agreste, MD  metFORMIN (GLUCOPHAGE) 1000 MG tablet TAKE 1 TABLET(1000 MG) BY MOUTH TWICE DAILY WITH A MEAL 04/16/20  Yes Wendie Agreste, MD  omeprazole (PRILOSEC) 20 MG capsule Take 1 capsule (20 mg total) by mouth daily. 12/06/19  Yes Cirigliano, [place], DO  ONETOUCH DELICA LANCETS 95G MISC USE TO TEST FASTING BLOOD SUGARS DAILY 02/23/17  Yes Jaynee Eagles, PA-C   Social History   Socioeconomic History   Marital status: Married    Spouse name: Not on file   Number of children: 1   Years of education: Not on file   Highest education level: Not on file  Occupational History   Occupation: [employer]  Tobacco Use   Smoking status: Every Day    Packs/day: 0.50    Years: 13.00    Pack years: 6.50    Types: Cigarettes   Smokeless tobacco: Never  Vaping Use   Vaping Use: Never used  Substance and Sexual Activity   Alcohol use: Not Currently    Comment: 09/07/19-none in 2 1/2 years   Drug use: No   Sexual activity: Yes  Other Topics Concern   Not on file  Social History Narrative   Married.  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    Education: [employer].   Cigarette smoker, no drug use no alcohol since 20 18-19      Social Determinants of Health   Financial Resource Strain: Not on file  Food Insecurity: Not on file  Transportation Needs: Not on file  Physical Activity: Not on file  Stress: Not on file  Social Connections: Not on file  Intimate Partner Violence: Not on file    Review of Systems  Constitutional:  Negative for fatigue  and unexpected weight change.  Eyes:  Negative for visual disturbance.  Respiratory:  Negative for cough, chest tightness and shortness of breath.   Cardiovascular:  Negative for chest pain, palpitations and leg swelling.  Gastrointestinal:  Negative for abdominal pain and blood in stool.  Neurological:  Negative for dizziness, light-headedness and headaches.    Objective:   Vitals:   08/09/20 0757  BP: 124/78  Pulse: 93  Resp: 16  Temp: 98.2 F (36.8 C)  TempSrc: Temporal  SpO2: 97%  Weight: 211 lb 6.4 oz (95.9 kg)  Height: 5' 9.5" (1.765 m)     Physical Exam Vitals reviewed.  Constitutional:  Appearance: He is well-developed.  HENT:     Head: Normocephalic and atraumatic.  Neck:     Vascular: No carotid bruit or JVD.  Cardiovascular:     Rate and Rhythm: Normal rate and regular rhythm.     Heart sounds: Normal heart sounds. No murmur heard. Pulmonary:     Effort: Pulmonary effort is normal.     Breath sounds: Normal breath sounds. No rales.  Musculoskeletal:     Right lower leg: No edema.     Left lower leg: No edema.  Skin:    General: Skin is warm and dry.  Neurological:     Mental Status: He is alert and oriented to person, place, and time.  Psychiatric:        Mood and Affect: Mood normal.       Assessment & Plan:  Ian Salazar is a 60 y.o. male . Type 2 diabetes mellitus with hyperglycemia, without long-term current use of insulin ([facility]) - Plan: Hemoglobin A1c  -Check A1c, continue metformin, refilled.  Commended on his weight loss, anticipate improvement in A1c.  Need for shingles vaccine - Plan: Varicella-zoster vaccine IM given, potential side effects discussed.  Essential hypertension - Plan: Comprehensive metabolic panel  -Stable, continue same regimen.  Check labs  Mixed hyperlipidemia - Plan: Comprehensive metabolic panel, Lipid panel  -  Stable, tolerating current regimen. Medications refilled. Labs pending as above.   Need for  pneumococcal vaccination - Plan: Pneumococcal conjugate vaccine 20-valent (Prevnar 20) given   No orders of the defined types were placed in this encounter.  Patient Instructions  Doristine Devoid work on the weight loss! Let me know the meter name and we can send test strips.  Thanks for coming in today.     Signed,   Merri Ray, MD [place] 08/09/20 8:37 AM

## 2020-08-09 NOTE — Patient Instructions (Addendum)
Great work on the [employer] loss! Let me know the meter name and we can send test strips.  Thanks for coming in today.

## 2020-08-15 LAB — HM DIABETES EYE EXAM

## 2020-09-23 ENCOUNTER — Ambulatory Visit (INDEPENDENT_AMBULATORY_CARE_PROVIDER_SITE_OTHER): Payer: PRIVATE HEALTH INSURANCE | Admitting: Family Medicine

## 2020-09-23 ENCOUNTER — Encounter: Payer: Self-pay | Admitting: Family Medicine

## 2020-09-23 ENCOUNTER — Other Ambulatory Visit: Payer: Self-pay

## 2020-09-23 VITALS — BP 136/74 | HR 88 | Temp 98.3°F | Resp 16 | Ht 69.0 in | Wt 218.6 lb

## 2020-09-23 DIAGNOSIS — F17210 Nicotine dependence, cigarettes, uncomplicated: Secondary | ICD-10-CM

## 2020-09-23 DIAGNOSIS — Z0000 Encounter for general adult medical examination without abnormal findings: Secondary | ICD-10-CM

## 2020-09-23 DIAGNOSIS — Z125 Encounter for screening for malignant neoplasm of prostate: Secondary | ICD-10-CM | POA: Diagnosis not present

## 2020-09-23 LAB — PSA: PSA: 0.23 ng/mL (ref 0.10–4.00)

## 2020-09-23 NOTE — Progress Notes (Signed)
Subjective:  Patient ID: Ian Salazar, male    DOB: [birth year]  Age: 60 y.o. MRN: [id number]  CC:  Chief Complaint  Patient presents with   Annual Exam    Pt here for physical pt no concerns     HPI KYREESE Salazar presents for   Annual physical exam.  Office visit July 8 through review chronic medical history including diabetes, hypertension, hyperlipidemia.  Continue same med regimen with plan for diet/exercise approach for improved diabetic control.  Lab Results  Component Value Date   HGBA1C 7.3 (H) 08/09/2020   Depression screen [facility] 2/9 09/23/2020 08/09/2020 02/09/2020 10/27/2019 07/07/2019  Decreased Interest 0 0 0 0 0  Down, Depressed, Hopeless 0 0 0 0 0  PHQ - 2 Score 0 0 0 0 0  Altered sleeping 0 0 - - -  Tired, decreased energy 0 0 - - -  Change in appetite 0 0 - - -  Feeling bad or failure about yourself  0 0 - - -  Trouble concentrating 0 0 - - -  Moving slowly or fidgety/restless 0 0 - - -  Suicidal thoughts 0 0 - - -  PHQ-9 Score 0 0 - - -   Cancer screening Colonoscopy 03/29/2015, repeat 5 years. Dr. Carlean Purl. Has appt for repeat in November.  Lab Results  Component Value Date   PSA 0.34 07/17/2011   PSA 0.39 04/18/2010  No FH of prostate CA.  The natural history of prostate cancer and ongoing controversy regarding screening and potential treatment outcomes of prostate cancer has been discussed with the patient. The meaning of a false positive PSA and a false negative PSA has been discussed. He indicates understanding of the limitations of this screening test and wishes to proceed with screening PSA testing. Defers DRE Brother recently diagnosed with pancreatic CA.     Immunization History  Administered Date(s) Administered   Influenza Whole 11/15/2008, 04/18/2010   Influenza,inj,Quad PF,6+ Mos 12/14/2018, 10/27/2019   Influenza-Unspecified 11/02/2014, 11/02/2016   PFIZER(Purple Top)SARS-COV-2 Vaccination 04/27/2019, 05/18/2019   PNEUMOCOCCAL CONJUGATE-20  08/09/2020   Pneumococcal Polysaccharide-23 12/14/2018   Td 11/15/2008   Tdap 07/07/2019   Zoster Recombinat (Shingrix) 08/09/2020  COVID-vaccine booster: recommended   Vision Screening   Right eye Left eye Both eyes  Without correction     With correction 20/20 20/20 20/15-1  Optho: appt last month.   Alcohol: none.   Tobacco: 1/2 ppd. Cutting back on cigarettes - less use. Others around him have quit.   Dental: in past year.   Exercise: walking dogs 30 min per day and cutting grass.        History Patient Active Problem List   Diagnosis Date Noted   Uncontrolled type 2 diabetes mellitus with insulin therapy ([place]) 04/22/2017   Hx of adenomatous colonic polyps 03/28/2015   Routine general medical examination at a health care facility 07/17/2011   Mixed hyperlipidemia 07/17/2011   Abnormal chest x-ray 07/17/2011   Vitamin B 12 deficiency 04/22/2010   ANEMIA-NOS 04/18/2010   TOBACCO USE 04/18/2010   DEGENERATIVE JOINT DISEASE, KNEE 10/31/5745   DM w/o complication type II 34/04/7094   Past Medical History:  Diagnosis Date   Abnormal EKG    Normal ETT 5 years ago   Anemia    GERD (gastroesophageal reflux disease)    occasional   HTN (hypertension)    Hx of adenomatous colonic polyps 03/28/2015   Hyperlipidemia    Knee pain, right    Rotator cuff  tendonitis    Left   Type II or unspecified type diabetes mellitus without mention of complication, not stated as uncontrolled    Past Surgical History:  Procedure Laterality Date   COLONOSCOPY W/ POLYPECTOMY  03/25/2015   MULTIPLE TOOTH EXTRACTIONS     UPPER GASTROINTESTINAL ENDOSCOPY  10/17/2019   No Known Allergies Prior to Admission medications   Medication Sig Start Date End Date Taking? Authorizing Provider  atorvastatin (LIPITOR) 20 MG tablet Take 1 tablet (20 mg total) by mouth daily. 08/09/20  Yes Wendie Agreste, MD  blood glucose meter kit and supplies KIT Dispense based on patient and insurance  preference. Check your fasting blood sugars daily. (FOR ICD-9 250.00, 250.01). 12/12/16  Yes Jaynee Eagles, PA-C  glucose blood (ONE TOUCH ULTRA TEST) test strip USE TO TEST FASTING BLOOD SUGARS DAILY 07/08/18  Yes Wendie Agreste, MD  lisinopril (ZESTRIL) 10 MG tablet Take 1 tablet (10 mg total) by mouth daily. 08/09/20  Yes Wendie Agreste, MD  metFORMIN (GLUCOPHAGE) 1000 MG tablet TAKE 1 TABLET(1000 MG) BY MOUTH TWICE DAILY WITH A MEAL 08/09/20  Yes Wendie Agreste, MD  omeprazole (PRILOSEC) 20 MG capsule Take 1 capsule (20 mg total) by mouth daily. 12/06/19  Yes Cirigliano, Levasy, DO  ONETOUCH DELICA LANCETS 70V MISC USE TO TEST FASTING BLOOD SUGARS DAILY 02/23/17  Yes Jaynee Eagles, PA-C   Social History   Socioeconomic History   Marital status: Married    Spouse name: Not on file   Number of children: 1   Years of education: Not on file   Highest education level: Not on file  Occupational History   Occupation: [employer]  Tobacco Use   Smoking status: Every Day    Packs/day: 0.50    Years: 13.00    Pack years: 6.50    Types: Cigarettes   Smokeless tobacco: Never  Vaping Use   Vaping Use: Never used  Substance and Sexual Activity   Alcohol use: Not Currently    Comment: 09/07/19-none in 2 1/2 years   Drug use: No   Sexual activity: Yes  Other Topics Concern   Not on file  Social History Narrative   Married.  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    Education: [employer].   Cigarette smoker, no drug use no alcohol since 20 18-19      Social Determinants of Health   Financial Resource Strain: Not on file  Food Insecurity: Not on file  Transportation Needs: Not on file  Physical Activity: Not on file  Stress: Not on file  Social Connections: Not on file  Intimate Partner Violence: Not on file    Review of Systems 13 point review of  systems per patient health survey noted.  Negative other than as indicated above or in HPI.    Objective:   Vitals:   09/23/20 1310  BP: 136/74  Pulse: 88  Resp: 16  Temp: 98.3 F (36.8 C)  TempSrc: Temporal  SpO2: 97%  Weight: 218 lb 9.6 oz (99.2 kg)  Height: '5\' 9"'  (1.753 m)     Physical Exam Vitals reviewed.  Constitutional:      Appearance: He is well-developed.  HENT:     Head: Normocephalic and atraumatic.     Right Ear: External ear normal.     Left Ear:  External ear normal.  Eyes:     Conjunctiva/sclera: Conjunctivae normal.     Pupils: Pupils are equal, round, and reactive to light.  Neck:     Thyroid: No thyromegaly.  Cardiovascular:     Rate and Rhythm: Normal rate and regular rhythm.     Heart sounds: Normal heart sounds.  Pulmonary:     Effort: Pulmonary effort is normal. No respiratory distress.     Breath sounds: Normal breath sounds. No wheezing.  Abdominal:     General: There is no distension.     Palpations: Abdomen is soft.     Tenderness: There is no abdominal tenderness.  Musculoskeletal:        General: No tenderness. Normal range of motion.     Cervical back: Normal range of motion and neck supple.  Lymphadenopathy:     Cervical: No cervical adenopathy.  Skin:    General: Skin is warm and dry.  Neurological:     Mental Status: He is alert and oriented to person, place, and time.     Deep Tendon Reflexes: Reflexes are normal and symmetric.  Psychiatric:        Behavior: Behavior normal.       Assessment & Plan:  SHIGERU LAMPERT is a 60 y.o. male . Annual physical exam  Cigarette nicotine dependence without complication  Screening for malignant neoplasm of prostate - Plan: PSA  -anticipatory guidance as below in AVS, screening labs above. Health maintenance items as above in HPI discussed/recommended as applicable.   -commended on smoking cessation attempts, handout given, consider meds to help if needed.   - check PSa at his  request  - 2 month recheck for DM.   No orders of the defined types were placed in this encounter.  Patient Instructions  Keep up the good work with cutting back on cigarettes and let me know if I can help.  Follow up as planned for colonoscopy and also with me in 2 months for diabetes.    Preventive Care 12-6 Years Old, Male Preventive care refers to lifestyle choices and visits with your health care provider that can promote health and wellness. This includes: A yearly physical exam. This is also called an annual wellness visit. Regular dental and eye exams. Immunizations. Screening for certain conditions. Healthy lifestyle choices, such as: Eating a healthy diet. Getting regular exercise. Not using drugs or products that contain nicotine and tobacco. Limiting alcohol use. What can I expect for my preventive care visit? Physical exam Your health care provider will check your: Height and weight. These may be used to calculate your BMI (body mass index). BMI is a measurement that tells if you are at a healthy weight. Heart rate and blood pressure. Body temperature. Skin for abnormal spots. Counseling Your health care provider may ask you questions about your: Past medical problems. Family's medical history. Alcohol, tobacco, and drug use. Emotional well-being. Home life and relationship well-being. Sexual activity. Diet, exercise, and sleep habits. Work and work Statistician. Access to firearms. What immunizations do I need?  Vaccines are usually given at various ages, according to a schedule. Your health care provider will recommend vaccines for you based on your age, medicalhistory, and lifestyle or other factors, such as travel or where you work. What tests do I need? Blood tests Lipid and cholesterol levels. These may be checked every 5 years, or more often if you are over 14 years old. Hepatitis C test. Hepatitis B test. Screening Lung cancer screening.  You may  have this screening every year starting at age 29 if you have a 30-pack-year history of smoking and currently smoke or have quit within the past 15 years. Prostate cancer screening. Recommendations will vary depending on your family history and other risks. Genital exam to check for testicular cancer or hernias. Colorectal cancer screening. All adults should have this screening starting at age 110 and continuing until age 64. Your health care provider may recommend screening at age 69 if you are at increased risk. You will have tests every 1-10 years, depending on your results and the type of screening test. Diabetes screening. This is done by checking your blood sugar (glucose) after you have not eaten for a while (fasting). You may have this done every 1-3 years. STD (sexually transmitted disease) testing, if you are at risk. Follow these instructions at home: Eating and drinking  Eat a diet that includes fresh fruits and vegetables, whole grains, lean protein, and low-fat dairy products. Take vitamin and mineral supplements as recommended by your health care provider. Do not drink alcohol if your health care provider tells you not to drink. If you drink alcohol: Limit how much you have to 0-2 drinks a day. Be aware of how much alcohol is in your drink. In the [place] one drink equals one 12 oz bottle of beer (355 mL), one 5 oz glass of wine (148 mL), or one 1 oz glass of hard liquor (44 mL).  Lifestyle Take daily care of your teeth and gums. Brush your teeth every morning and night with fluoride toothpaste. Floss one time each day. Stay active. Exercise for at least 30 minutes 5 or more days each week. Do not use any products that contain nicotine or tobacco, such as cigarettes, e-cigarettes, and chewing tobacco. If you need help quitting, ask your health care provider. Do not use drugs. If you are sexually active, practice safe sex. Use a condom or other form of protection to prevent STIs  (sexually transmitted infections). If told by your health care provider, take low-dose aspirin daily starting at age 61. Find healthy ways to cope with stress, such as: Meditation, yoga, or listening to music. Journaling. Talking to a trusted person. Spending time with friends and family. Safety Always wear your seat belt while driving or riding in a vehicle. Do not drive: If you have been drinking alcohol. Do not ride with someone who has been drinking. When you are tired or distracted. While texting. Wear a helmet and other protective equipment during sports activities. If you have firearms in your house, make sure you follow all gun safety procedures. What's next? Go to your health care provider once a year for an annual wellness visit. Ask your health care provider how often you should have your eyes and teeth checked. Stay up to date on all vaccines. This information is not intended to replace advice given to you by your health care provider. Make sure you discuss any questions you have with your healthcare provider. Document Revised: 10/18/2018 Document Reviewed: 01/13/2018 Elsevier Patient Education  2022 [place] of Quitting Smoking Quitting smoking is a physical and mental challenge. You will face cravings, withdrawal symptoms, and temptation. Before quitting, work with your health care provider to make a plan that can help you manage quitting. Preparation canhelp you quit and keep you from giving in. How to manage lifestyle changes Managing stress Stress can make you want to smoke, and wanting to smoke may cause  stress. It is important to find ways to manage your stress. You might try some of the following: Practice relaxation techniques. Breathe slowly and deeply, in through your nose and out through your mouth. Listen to music. Soak in a bath or take a shower. Imagine a peaceful place or vacation. Get some support. Talk with family or  friends about your stress. Join a support group. Talk with a counselor or therapist. Get some physical activity. Go for a walk, run, or bike ride. Play a favorite sport. Practice yoga.  Medicines Talk with your health care provider about medicines that might help you dealwith cravings and make quitting easier for you. Relationships Social situations can be difficult when you are quitting smoking. To manage this, you can: Avoid parties and other social situations where people might be smoking. Avoid alcohol. Leave right away if you have the urge to smoke. Explain to your family and friends that you are quitting smoking. Ask for support and let them know you might be a bit grumpy. Plan activities where smoking is not an option. General instructions Be aware that many people gain weight after they quit smoking. However, not everyone does. To keep from gaining weight, have a plan in place before you quit and stick to the plan after you quit. Your plan should include: Having healthy snacks. When you have a craving, it may help to: Eat popcorn, carrots, celery, or other cut vegetables. Chew sugar-free gum. Changing how you eat. Eat small portion sizes at meals. Eat 4-6 small meals throughout the day instead of 1-2 large meals a day. Be mindful when you eat. Do not watch television or do other things that might distract you as you eat. Exercising regularly. Make time to exercise each day. If you do not have time for a long workout, do short bouts of exercise for 5-10 minutes several times a day. Do some form of strengthening exercise, such as weight lifting. Do some exercise that gets your heart beating and causes you to breathe deeply, such as walking fast, running, swimming, or biking. This is very important. Drinking plenty of water or other low-calorie or no-calorie drinks. Drink 6-8 glasses of water daily.  How to recognize withdrawal symptoms Your body and mind may experience  discomfort as you try to get used to not having nicotine in your system. These effects are called withdrawal symptoms. They may include: Feeling hungrier than normal. Having trouble concentrating. Feeling irritable or restless. Having trouble sleeping. Feeling depressed. Craving a cigarette. To manage withdrawal symptoms: Avoid places, people, and activities that trigger your cravings. Remember why you want to quit. Get plenty of sleep. Avoid coffee and other caffeinated drinks. These may worsen some of your symptoms. These symptoms may surprise you. But be assured that they are normal to havewhen quitting smoking. How to manage cravings Come up with a plan for how to deal with your cravings. The plan should include the following: A definition of the specific situation you want to deal with. An alternative action you will take. A clear idea for how this action will help. The name of someone who might help you with this. Cravings usually last for 5-10 minutes. Consider taking the following actions to help you with your plan to deal with cravings: Keep your mouth busy. Chew sugar-free gum. Suck on hard candies or a straw. Brush your teeth. Keep your hands and body busy. Change to a different activity right away. Squeeze or play with a ball. Do an activity  or a hobby, such as making bead jewelry, practicing needlepoint, or working with wood. Mix up your normal routine. Take a short exercise break. Go for a quick walk or run up and down stairs. Focus on doing something kind or helpful for someone else. Call a friend or family member to talk during a craving. Join a support group. Contact a quitline. Where to find support To get help or find a support group: Call the [facility]'s Smoking Quitline: 1-800-QUIT NOW 220-222-8003) Visit the website of the Substance Abuse and [place]: ktimeonline.com Text QUIT to SmokefreeTXT: 390300 Where to find  more information Visit these websites to find more information on quitting smoking: [place]: www.smokefree.gov American Lung Association: www.lung.org American Cancer Society: www.cancer.org Centers for Disease Control and Prevention: http://www.wolf.info/ American Heart Association: www.heart.org Contact a health care provider if: You want to change your plan for quitting. The medicines you are taking are not helping. Your eating feels out of control or you cannot sleep. Get help right away if: You feel depressed or become very anxious. Summary Quitting smoking is a physical and mental challenge. You will face cravings, withdrawal symptoms, and temptation to smoke again. Preparation can help you as you go through these challenges. Try different techniques to manage stress, handle social situations, and prevent weight gain. You can deal with cravings by keeping your mouth busy (such as by chewing gum), keeping your hands and body busy, calling family or friends, or contacting a quitline for people who want to quit smoking. You can deal with withdrawal symptoms by avoiding places where people smoke, getting plenty of rest, and avoiding drinks with caffeine. This information is not intended to replace advice given to you by your health care provider. Make sure you discuss any questions you have with your healthcare provider. Document Revised: 11/08/2018 Document Reviewed: 11/08/2018 Elsevier Patient Education  2022 [place],   Merri Ray, MD [place] [place] [facility] 09/23/20 2:18 PM

## 2020-09-23 NOTE — Patient Instructions (Addendum)
Keep up the good work with cutting back on cigarettes and let me know if I can help.  Follow up as planned for colonoscopy and also with me in 2 months for diabetes.    Preventive Care 40-60 Years Old, Male Preventive care refers to lifestyle choices and visits with your health care provider that can promote health and wellness. This includes: A yearly physical exam. This is also called an annual wellness visit. Regular dental and eye exams. Immunizations. Screening for certain conditions. Healthy lifestyle choices, such as: Eating a healthy diet. Getting regular exercise. Not using drugs or products that contain nicotine and tobacco. Limiting alcohol use. What can I expect for my preventive care visit? Physical exam Your health care provider will check your: Height and weight. These may be used to calculate your BMI (body mass index). BMI is a measurement that tells if you are at a healthy weight. Heart rate and blood pressure. Body temperature. Skin for abnormal spots. Counseling Your health care provider may ask you questions about your: Past medical problems. Family's medical history. Alcohol, tobacco, and drug use. Emotional well-being. Home life and relationship well-being. Sexual activity. Diet, exercise, and sleep habits. Work and work Statistician. Access to firearms. What immunizations do I need?  Vaccines are usually given at various ages, according to a schedule. Your health care provider will recommend vaccines for you based on your age, medicalhistory, and lifestyle or other factors, such as travel or where you work. What tests do I need? Blood tests Lipid and cholesterol levels. These may be checked every 5 years, or more often if you are over 28 years old. Hepatitis C test. Hepatitis B test. Screening Lung cancer screening. You may have this screening every year starting at age 68 if you have a 30-pack-year history of smoking and currently smoke or have quit  within the past 15 years. Prostate cancer screening. Recommendations will vary depending on your family history and other risks. Genital exam to check for testicular cancer or hernias. Colorectal cancer screening. All adults should have this screening starting at age 80 and continuing until age 79. Your health care provider may recommend screening at age 25 if you are at increased risk. You will have tests every 1-10 years, depending on your results and the type of screening test. Diabetes screening. This is done by checking your blood sugar (glucose) after you have not eaten for a while (fasting). You may have this done every 1-3 years. STD (sexually transmitted disease) testing, if you are at risk. Follow these instructions at home: Eating and drinking  Eat a diet that includes fresh fruits and vegetables, whole grains, lean protein, and low-fat dairy products. Take vitamin and mineral supplements as recommended by your health care provider. Do not drink alcohol if your health care provider tells you not to drink. If you drink alcohol: Limit how much you have to 0-2 drinks a day. Be aware of how much alcohol is in your drink. In the [place] one drink equals one 12 oz bottle of beer (355 mL), one 5 oz glass of wine (148 mL), or one 1 oz glass of hard liquor (44 mL).  Lifestyle Take daily care of your teeth and gums. Brush your teeth every morning and night with fluoride toothpaste. Floss one time each day. Stay active. Exercise for at least 30 minutes 5 or more days each week. Do not use any products that contain nicotine or tobacco, such as cigarettes, e-cigarettes, and chewing tobacco. If you  need help quitting, ask your health care provider. Do not use drugs. If you are sexually active, practice safe sex. Use a condom or other form of protection to prevent STIs (sexually transmitted infections). If told by your health care provider, take low-dose aspirin daily starting at age 76. Find  healthy ways to cope with stress, such as: Meditation, yoga, or listening to music. Journaling. Talking to a trusted person. Spending time with friends and family. Safety Always wear your seat belt while driving or riding in a vehicle. Do not drive: If you have been drinking alcohol. Do not ride with someone who has been drinking. When you are tired or distracted. While texting. Wear a helmet and other protective equipment during sports activities. If you have firearms in your house, make sure you follow all gun safety procedures. What's next? Go to your health care provider once a year for an annual wellness visit. Ask your health care provider how often you should have your eyes and teeth checked. Stay up to date on all vaccines. This information is not intended to replace advice given to you by your health care provider. Make sure you discuss any questions you have with your healthcare provider. Document Revised: 10/18/2018 Document Reviewed: 01/13/2018 Elsevier Patient Education  2022 [place] of Quitting Smoking Quitting smoking is a physical and mental challenge. You will face cravings, withdrawal symptoms, and temptation. Before quitting, work with your health care provider to make a plan that can help you manage quitting. Preparation canhelp you quit and keep you from giving in. How to manage lifestyle changes Managing stress Stress can make you want to smoke, and wanting to smoke may cause stress. It is important to find ways to manage your stress. You might try some of the following: Practice relaxation techniques. Breathe slowly and deeply, in through your nose and out through your mouth. Listen to music. Soak in a bath or take a shower. Imagine a peaceful place or vacation. Get some support. Talk with family or friends about your stress. Join a support group. Talk with a counselor or therapist. Get some physical activity. Go for a walk,  run, or bike ride. Play a favorite sport. Practice yoga.  Medicines Talk with your health care provider about medicines that might help you dealwith cravings and make quitting easier for you. Relationships Social situations can be difficult when you are quitting smoking. To manage this, you can: Avoid parties and other social situations where people might be smoking. Avoid alcohol. Leave right away if you have the urge to smoke. Explain to your family and friends that you are quitting smoking. Ask for support and let them know you might be a bit grumpy. Plan activities where smoking is not an option. General instructions Be aware that many people gain weight after they quit smoking. However, not everyone does. To keep from gaining weight, have a plan in place before you quit and stick to the plan after you quit. Your plan should include: Having healthy snacks. When you have a craving, it may help to: Eat popcorn, carrots, celery, or other cut vegetables. Chew sugar-free gum. Changing how you eat. Eat small portion sizes at meals. Eat 4-6 small meals throughout the day instead of 1-2 large meals a day. Be mindful when you eat. Do not watch television or do other things that might distract you as you eat. Exercising regularly. Make time to exercise each day. If you do not have time for a  long workout, do short bouts of exercise for 5-10 minutes several times a day. Do some form of strengthening exercise, such as weight lifting. Do some exercise that gets your heart beating and causes you to breathe deeply, such as walking fast, running, swimming, or biking. This is very important. Drinking plenty of water or other low-calorie or no-calorie drinks. Drink 6-8 glasses of water daily.  How to recognize withdrawal symptoms Your body and mind may experience discomfort as you try to get used to not having nicotine in your system. These effects are called withdrawal symptoms. They may  include: Feeling hungrier than normal. Having trouble concentrating. Feeling irritable or restless. Having trouble sleeping. Feeling depressed. Craving a cigarette. To manage withdrawal symptoms: Avoid places, people, and activities that trigger your cravings. Remember why you want to quit. Get plenty of sleep. Avoid coffee and other caffeinated drinks. These may worsen some of your symptoms. These symptoms may surprise you. But be assured that they are normal to havewhen quitting smoking. How to manage cravings Come up with a plan for how to deal with your cravings. The plan should include the following: A definition of the specific situation you want to deal with. An alternative action you will take. A clear idea for how this action will help. The name of someone who might help you with this. Cravings usually last for 5-10 minutes. Consider taking the following actions to help you with your plan to deal with cravings: Keep your mouth busy. Chew sugar-free gum. Suck on hard candies or a straw. Brush your teeth. Keep your hands and body busy. Change to a different activity right away. Squeeze or play with a ball. Do an activity or a hobby, such as making bead jewelry, practicing needlepoint, or working with wood. Mix up your normal routine. Take a short exercise break. Go for a quick walk or run up and down stairs. Focus on doing something kind or helpful for someone else. Call a friend or family member to talk during a craving. Join a support group. Contact a quitline. Where to find support To get help or find a support group: Call the [facility]'s Smoking Quitline: 1-800-QUIT NOW 947-189-7184) Visit the website of the Substance Abuse and [place]: ktimeonline.com Text QUIT to SmokefreeTXTMQ:317211 Where to find more information Visit these websites to find more information on quitting smoking: [place]:  www.smokefree.gov American Lung Association: www.lung.org American Cancer Society: www.cancer.org Centers for Disease Control and Prevention: http://www.wolf.info/ American Heart Association: www.heart.org Contact a health care provider if: You want to change your plan for quitting. The medicines you are taking are not helping. Your eating feels out of control or you cannot sleep. Get help right away if: You feel depressed or become very anxious. Summary Quitting smoking is a physical and mental challenge. You will face cravings, withdrawal symptoms, and temptation to smoke again. Preparation can help you as you go through these challenges. Try different techniques to manage stress, handle social situations, and prevent weight gain. You can deal with cravings by keeping your mouth busy (such as by chewing gum), keeping your hands and body busy, calling family or friends, or contacting a quitline for people who want to quit smoking. You can deal with withdrawal symptoms by avoiding places where people smoke, getting plenty of rest, and avoiding drinks with caffeine. This information is not intended to replace advice given to you by your health care provider. Make sure you discuss any questions you  have with your healthcare provider. Document Revised: 11/08/2018 Document Reviewed: 11/08/2018 Elsevier Patient Education  [place].

## 2020-11-25 ENCOUNTER — Other Ambulatory Visit: Payer: Self-pay

## 2020-11-25 ENCOUNTER — Ambulatory Visit (INDEPENDENT_AMBULATORY_CARE_PROVIDER_SITE_OTHER): Payer: PRIVATE HEALTH INSURANCE | Admitting: Family Medicine

## 2020-11-25 ENCOUNTER — Encounter: Payer: Self-pay | Admitting: Family Medicine

## 2020-11-25 VITALS — BP 118/70 | HR 84 | Temp 98.2°F | Resp 15 | Ht 69.0 in | Wt 210.4 lb

## 2020-11-25 DIAGNOSIS — Z800 Family history of malignant neoplasm of digestive organs: Secondary | ICD-10-CM

## 2020-11-25 DIAGNOSIS — R1012 Left upper quadrant pain: Secondary | ICD-10-CM | POA: Diagnosis not present

## 2020-11-25 DIAGNOSIS — Z23 Encounter for immunization: Secondary | ICD-10-CM | POA: Diagnosis not present

## 2020-11-25 DIAGNOSIS — E1165 Type 2 diabetes mellitus with hyperglycemia: Secondary | ICD-10-CM | POA: Diagnosis not present

## 2020-11-25 LAB — COMPREHENSIVE METABOLIC PANEL
ALT: 14 U/L (ref 0–53)
AST: 17 U/L (ref 0–37)
Albumin: 4.3 g/dL (ref 3.5–5.2)
Alkaline Phosphatase: 62 U/L (ref 39–117)
BUN: 12 mg/dL (ref 6–23)
CO2: 28 mEq/L (ref 19–32)
Calcium: 9.2 mg/dL (ref 8.4–10.5)
Chloride: 104 mEq/L (ref 96–112)
Creatinine, Ser: 0.83 mg/dL (ref 0.40–1.50)
GFR: 95.05 mL/min (ref >60.00)
Glucose, Bld: 136 mg/dL — ABNORMAL HIGH (ref 70–99)
Potassium: 4 mEq/L (ref 3.5–5.1)
Sodium: 140 mEq/L (ref 135–145)
Total Bilirubin: 0.3 mg/dL (ref 0.2–1.2)
Total Protein: 7 g/dL (ref 6.0–8.3)

## 2020-11-25 LAB — MICROALBUMIN / CREATININE URINE RATIO
Creatinine,U: 115.7 mg/dL
Microalb Creat Ratio: 0.6 mg/g (ref 0.0–30.0)
Microalb, Ur: <0.7 mg/dL (ref 0.0–1.9)

## 2020-11-25 LAB — LIPASE: Lipase: 33 U/L (ref 11.0–59.0)

## 2020-11-25 LAB — HEMOGLOBIN A1C: Hgb A1c MFr Bld: 7.5 % — ABNORMAL HIGH (ref 4.6–6.5)

## 2020-11-25 NOTE — Patient Instructions (Addendum)
[facility] is now offering the bivalent Covid booster vaccine. To schedule an appointment or to find more information, visit [email or web address] You can also call [phone], Monday through Friday, 7 a.m. to 7 p.m.  I will check some labs including your diabetes tests, and a pancreas test. I referred you to genetic counseling to discuss other screening based on your brother's recent diagnosis. If any new nausea, abdominal pain or unexplained weight loss we should perform other testing. Let me know.   Return to the clinic or go to the nearest emergency room if any of your symptoms worsen or new symptoms occur.   Type 2 Diabetes Mellitus, Self-Care, Adult Caring for yourself after you have been diagnosed with type 2 diabetes (type 2 diabetes mellitus) means keeping your blood sugar (glucose) under control with a balance of: Nutrition. Exercise. Lifestyle changes. Medicines or insulin, if needed. Support from your team of health care providers and others. The following information explains what you need to know to manage your diabetes at home. What are the risks? Having diabetes can put you at risk for other long-term (chronic) conditions, such as heart disease and kidney disease. Your health care provider may prescribe medicines to help prevent complications from diabetes. How to monitor blood glucose  Check your blood glucose every day, or as often as told by your health care provider. Have your A1C (hemoglobin A1C) level checked two or more times a year, or as often as told by your health care provider. Your health care provider will set personalized treatment goals for you. Generally, the goal of treatment is to maintain the following blood glucose levels: Before meals: 80-130 mg/dL (4.4-7.2 mmol/L). After meals: below 180 mg/dL (10 mmol/L). A1C level: less than 7%. How to manage hyperglycemia and hypoglycemia Hyperglycemia symptoms Hyperglycemia, also called high blood glucose,  occurs when blood glucose is too high. Make sure you know the early signs of hyperglycemia, such as: Increased thirst. Hunger. Feeling very tired. Needing to urinate more often than usual. Blurry vision. Hypoglycemia symptoms Hypoglycemia, also called low blood glucose, occurs with a blood glucose level at or below 70 mg/dL (3.9 mmol/L). Diabetes medicines lower your blood glucose and can cause hypoglycemia. The risk for hypoglycemia increases during or after exercise, during sleep, during illness, and when skipping meals or not eating for a long time (fasting). It is important to know the symptoms of hypoglycemia and treat it right away. Always have a 15-gram rapid-acting carbohydrate snack with you to treat low blood glucose. Family members and close friends should also know the symptoms and understand how to treat hypoglycemia, in case you are not able to treat yourself. Symptoms may include: Hunger. Anxiety. Sweating and feeling clammy. Dizziness or feeling light-headed. Sleepiness. Increased heart rate. Irritability. Tingling or numbness around the mouth, lips, or tongue. Restless sleep. Severe hypoglycemia is when your blood glucose level is at or below 54 mg/dL (3 mmol/L). Severe hypoglycemia is an emergency. Do not wait to see if the symptoms will go away. Get medical help right away. Call your local emergency services (911 in the [place] Do not drive yourself to the hospital. If you have severe hypoglycemia and you cannot eat or drink, you may need glucagon. A family member or close friend should learn how to check your blood glucose and how to give you glucagon. Ask your health care provider if you need to have an emergency glucagon kit available. Follow these instructions at home: Medicines Take diabetes medicines as told by your  health care provider. If your health care provider prescribed insulin or diabetes medicines, take them every day. Do not run out of insulin or other  diabetes medicines. Plan ahead so you always have these available. If you use insulin, adjust your dosage based on your physical activity and what foods you eat. Your health care provider will tell you how to adjust your dosage. Take over-the-counter and prescription medicines only as told by your health care provider. Eating and drinking What you eat and drink affects your blood glucose and your insulin dosage. Making good choices helps to control your diabetes and prevent other health problems. A healthy meal plan includes eating lean proteins, complex carbohydrates, fresh fruits and vegetables, low-fat dairy products, and healthy fats. Make an appointment to see a registered dietitian to help you create an eating plan that is right for you. Make sure that you: Follow instructions from your health care provider about eating or drinking restrictions. Drink enough fluid to keep your urine pale yellow. Keep a record of the carbohydrates that you eat. Do this by reading food labels and learning the standard serving sizes of foods. Follow your sick-day plan whenever you cannot eat or drink as usual. Make this plan in advance with your health care provider.  Activity Stay active. Exercise regularly, as told by your health care provider. This may include: Stretching and doing strength exercises, such as yoga or weight lifting, 2 or more times a week. Doing 150 minutes or more of moderate-intensity or vigorous-intensity exercise each week. This could be brisk walking, biking, or water aerobics. Spread out your activity over 3 or more days of the week. Do not go more than 2 days in a row without doing some kind of physical activity. When you start a new exercise or activity, work with your health care provider to adjust your insulin, medicines, or food intake as needed. Lifestyle Do not use any products that contain nicotine or tobacco, such as cigarettes, e-cigarettes, and chewing tobacco. If you need  help quitting, ask your health care provider. If your health care provider says that alcohol is safe for you, limit how much you use to no more than 1 drink a day for women who are not pregnant and 2 drinks a day for men. In the [place] one drink equals one 12 oz bottle of beer (355 mL), one 5 oz glass of wine (148 mL), or one 1 oz glass of hard liquor (44 mL). Learn to manage stress. If you need help with this, ask your health care provider. Take care of your body  Keep your immunizations up to date. In addition to getting vaccinations as told by your health care provider, it is recommended that you get vaccinated against the following illnesses: The flu (influenza). Get a flu shot every year. Pneumonia. Hepatitis B. Schedule an eye exam soon after your diagnosis, and then one time every year after that. Check your skin and feet every day for cuts, bruises, redness, blisters, or sores. Schedule a foot exam with your health care provider once every year. Brush your teeth and gums two times a day, and floss one or more times a day. Visit your dentist one or more times every 6 months. Maintain a healthy weight. General instructions Share your diabetes management plan with people in your workplace, school, and household. Carry a medical alert card or wear medical alert jewelry. Keep all follow-up visits as told by your health care provider. This is important. Questions to ask  your health care provider Should I meet with a certified diabetes care and education specialist? Where can I find a support group for people with diabetes? Where to find more information American Diabetes Association (ADA): www.diabetes.org American Association of Diabetes Care and Education Specialists (ADCES): www.diabeteseducator.org International Diabetes Federation (IDF): MemberVerification.ca Summary Caring for yourself after you have been diagnosed with type 2 diabetes (type 2 diabetes mellitus) means keeping your blood sugar  (glucose) under control with a balance of nutrition, exercise, lifestyle changes, and medicine. Check your blood glucose every day, as often as told by your health care provider. Having diabetes can put you at risk for other long-term (chronic) conditions, such as heart disease and kidney disease. Your health care provider may prescribe medicines to help prevent complications from diabetes. Share your diabetes management plan with people in your workplace, school, and household. Keep all follow-up visits as told by your health care provider. This is important. This information is not intended to replace advice given to you by your health care provider. Make sure you discuss any questions you have with your health care provider. Document Revised: 01/03/2020 Document Reviewed: 08/23/2019 Elsevier Patient Education  [place].

## 2020-11-25 NOTE — Progress Notes (Signed)
Subjective:  Patient ID: Ian Salazar, male    DOB: 28-Nov-1960  Age: 60 y.o. MRN: [id number]  CC:  Chief Complaint  Patient presents with   Diabetes    Pt here for 2 month follow up to cover DM reports    Immunizations    Pt due for shingles dose 2    Family History Cancer    Pt reports brother recently dx with pancreatic cancer and wanted to know if he should be checked or if there is a test for this he can have done.     HPI Ian Salazar presents for   Diabetes: With hyperglycemia.  Treated with metformin 1000 mg twice daily, he is on ACE inhibitor and statin.  Microalbumin ratio was normal in September 2021.  Had lost weight to work on improved control earlier this year, A1c was stable in July.  Has continued to lose weight and is down 8 pounds from his August visit. Has tried to lose weight - cut back on late meals and portions.   Home readings: none - needs test strips refilled. Unknown brand - he will have pharmacy request if needed.   Wt Readings from Last 3 Encounters:  11/25/20 210 lb 6.4 oz (95.4 kg)  09/23/20 218 lb 9.6 oz (99.2 kg)  08/09/20 211 lb 6.4 oz (95.9 kg)    Lab Results  Component Value Date   HGBA1C 7.3 (H) 08/09/2020   HGBA1C 7.3 (H) 02/09/2020   HGBA1C 6.8 (A) 10/27/2019   Lab Results  Component Value Date   LDLCALC 57 08/09/2020   CREATININE 0.89 08/09/2020   Family history of pancreatic cancer Older brother diagnosed with pancreatic  cancer 2-3 weeks ago. No other FH of pancreatic cancer.  Occasional LUQ soreness that improves with omeprazole.  Diabetes for over 10 years. .  No unexplained weight loss. No jaundice. No night sweats. No n/v.    Health maintenance Flu vaccine, 2nd shingles vaccine given today.  Bivalent COVID-vaccine booster recommended.  Immunization History  Administered Date(s) Administered   Influenza Whole 11/15/2008, 04/18/2010   Influenza,inj,Quad PF,6+ Mos 12/14/2018, 10/27/2019, 11/25/2020    Influenza-Unspecified 11/02/2014, 11/02/2016   PFIZER(Purple Top)SARS-COV-2 Vaccination 04/27/2019, 05/18/2019   PNEUMOCOCCAL CONJUGATE-20 08/09/2020   Pneumococcal Polysaccharide-23 12/14/2018   Td 11/15/2008   Tdap 07/07/2019   Zoster Recombinat (Shingrix) 08/09/2020, 11/25/2020      History Patient Active Problem List   Diagnosis Date Noted   Uncontrolled type 2 diabetes mellitus with insulin therapy 04/22/2017   Hx of adenomatous colonic polyps 03/28/2015   Routine general medical examination at a health care facility 07/17/2011   Mixed hyperlipidemia 07/17/2011   Abnormal chest x-ray 07/17/2011   Vitamin B 12 deficiency 04/22/2010   ANEMIA-NOS 04/18/2010   TOBACCO USE 04/18/2010   DEGENERATIVE JOINT DISEASE, KNEE 10/25/3005   DM w/o complication type II 62/26/3335   Past Medical History:  Diagnosis Date   Abnormal EKG    Normal ETT 5 years ago   Anemia    GERD (gastroesophageal reflux disease)    occasional   HTN (hypertension)    Hx of adenomatous colonic polyps 03/28/2015   Hyperlipidemia    Knee pain, right    Rotator cuff tendonitis    Left   Type II or unspecified type diabetes mellitus without mention of complication, not stated as uncontrolled    Past Surgical History:  Procedure Laterality Date   COLONOSCOPY W/ POLYPECTOMY  03/25/2015   MULTIPLE TOOTH EXTRACTIONS  UPPER GASTROINTESTINAL ENDOSCOPY  10/17/2019   No Known Allergies Prior to Admission medications   Medication Sig Start Date End Date Taking? Authorizing Provider  atorvastatin (LIPITOR) 20 MG tablet Take 1 tablet (20 mg total) by mouth daily. 08/09/20  Yes Wendie Agreste, MD  blood glucose meter kit and supplies KIT Dispense based on patient and insurance preference. Check your fasting blood sugars daily. (FOR ICD-9 250.00, 250.01). 12/12/16  Yes Jaynee Eagles, PA-C  glucose blood (ONE TOUCH ULTRA TEST) test strip USE TO TEST FASTING BLOOD SUGARS DAILY 07/08/18  Yes Wendie Agreste, MD   lisinopril (ZESTRIL) 10 MG tablet Take 1 tablet (10 mg total) by mouth daily. 08/09/20  Yes Wendie Agreste, MD  metFORMIN (GLUCOPHAGE) 1000 MG tablet TAKE 1 TABLET(1000 MG) BY MOUTH TWICE DAILY WITH A MEAL 08/09/20  Yes Wendie Agreste, MD  omeprazole (PRILOSEC) 20 MG capsule Take 1 capsule (20 mg total) by mouth daily. 12/06/19  Yes Cirigliano, Marie, DO  ONETOUCH DELICA LANCETS 70J MISC USE TO TEST FASTING BLOOD SUGARS DAILY 02/23/17  Yes Jaynee Eagles, PA-C   Social History   Socioeconomic History   Marital status: Married    Spouse name: Not on file   Number of children: 1   Years of education: Not on file   Highest education level: Not on file  Occupational History   Occupation: [employer]  Tobacco Use   Smoking status: Every Day    Packs/day: 0.50    Years: 13.00    Pack years: 6.50    Types: Cigarettes   Smokeless tobacco: Never  Vaping Use   Vaping Use: Never used  Substance and Sexual Activity   Alcohol use: Not Currently    Comment: 09/07/19-none in 2 1/2 years   Drug use: No   Sexual activity: Yes  Other Topics Concern   Not on file  Social History Narrative   Married.  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    Education: [employer].   Cigarette smoker, no drug use no alcohol since 20 18-19      Social Determinants of Health   Financial Resource Strain: Not on file  Food Insecurity: Not on file  Transportation Needs: Not on file  Physical Activity: Not on file  Stress: Not on file  Social Connections: Not on file  Intimate Partner Violence: Not on file    Review of Systems  Constitutional:  Negative for fatigue and unexpected weight change.  Eyes:  Negative for visual disturbance.  Respiratory:  Negative for cough, chest tightness and shortness of breath.   Cardiovascular:  Negative for chest pain, palpitations and leg  swelling.  Gastrointestinal:  Negative for abdominal pain and blood in stool.  Neurological:  Negative for dizziness, light-headedness and headaches.    Objective:   Vitals:   11/25/20 1050  BP: 118/70  Pulse: 84  Resp: 15  Temp: 98.2 F (36.8 C)  TempSrc: Temporal  SpO2: 98%  Weight: 210 lb 6.4 oz (95.4 kg)  Height: '5\' 9"'  (1.753 m)     Physical Exam Vitals reviewed.  Constitutional:      Appearance: He is well-developed.  HENT:     Head: Normocephalic and atraumatic.  Neck:     Vascular: No carotid bruit or JVD.  Cardiovascular:     Rate and Rhythm: Normal  rate and regular rhythm.     Heart sounds: Normal heart sounds. No murmur heard. Pulmonary:     Effort: Pulmonary effort is normal.     Breath sounds: Normal breath sounds. No rales.  Abdominal:     General: There is no distension.     Palpations: There is no mass.     Tenderness: There is no abdominal tenderness.  Musculoskeletal:     Right lower leg: No edema.     Left lower leg: No edema.  Skin:    General: Skin is warm and dry.  Neurological:     Mental Status: He is alert and oriented to person, place, and time.  Psychiatric:        Mood and Affect: Mood normal.     Assessment & Plan:  Ian Salazar is a 60 y.o. male . Type 2 diabetes mellitus with hyperglycemia, without long-term current use of insulin (HCC) - Plan: Comprehensive metabolic panel, Hemoglobin A1c, Microalbumin / creatinine urine ratio  -Intentional weight loss as above.  Check labs.  No med changes for now.  Need for influenza vaccination - Plan: Flu Vaccine QUAD 6+ mos PF IM (Fluarix Quad PF)  Need for shingles vaccine - Plan: Varicella-zoster vaccine IM  Family history of pancreatic cancer - Plan: Ambulatory referral to Genetics, Lipase LUQ abdominal pain - Plan: Lipase  -As above family history of pancreatic cancer, intermittent left upper quadrant symptoms that improved with PPI, likely GERD/gastritis but will check lipase.   Referred to genetics to decide on testing, further evaluation for pancreatic cancer in family.  No orders of the defined types were placed in this encounter.  Patient Instructions  [facility] is now offering the bivalent Covid booster vaccine. To schedule an appointment or to find more information, visit [email or web address] You can also call [phone], Monday through Friday, 7 a.m. to 7 p.m.  I will check some labs including your diabetes tests, and a pancreas test. I referred you to genetic counseling to discuss other screening based on your brother's recent diagnosis. If any new nausea, abdominal pain or unexplained weight loss we should perform other testing. Let me know.   Return to the clinic or go to the nearest emergency room if any of your symptoms worsen or new symptoms occur.   Type 2 Diabetes Mellitus, Self-Care, Adult Caring for yourself after you have been diagnosed with type 2 diabetes (type 2 diabetes mellitus) means keeping your blood sugar (glucose) under control with a balance of: Nutrition. Exercise. Lifestyle changes. Medicines or insulin, if needed. Support from your team of health care providers and others. The following information explains what you need to know to manage your diabetes at home. What are the risks? Having diabetes can put you at risk for other long-term (chronic) conditions, such as heart disease and kidney disease. Your health care provider may prescribe medicines to help prevent complications from diabetes. How to monitor blood glucose  Check your blood glucose every day, or as often as told by your health care provider. Have your A1C (hemoglobin A1C) level checked two or more times a year, or as often as told by your health care provider. Your health care provider will set personalized treatment goals for you. Generally, the goal of treatment is to maintain the following blood glucose levels: Before meals: 80-130 mg/dL (4.4-7.2  mmol/L). After meals: below 180 mg/dL (10 mmol/L). A1C level: less than 7%. How to manage hyperglycemia and hypoglycemia Hyperglycemia symptoms Hyperglycemia, also called high blood  glucose, occurs when blood glucose is too high. Make sure you know the early signs of hyperglycemia, such as: Increased thirst. Hunger. Feeling very tired. Needing to urinate more often than usual. Blurry vision. Hypoglycemia symptoms Hypoglycemia, also called low blood glucose, occurs with a blood glucose level at or below 70 mg/dL (3.9 mmol/L). Diabetes medicines lower your blood glucose and can cause hypoglycemia. The risk for hypoglycemia increases during or after exercise, during sleep, during illness, and when skipping meals or not eating for a long time (fasting). It is important to know the symptoms of hypoglycemia and treat it right away. Always have a 15-gram rapid-acting carbohydrate snack with you to treat low blood glucose. Family members and close friends should also know the symptoms and understand how to treat hypoglycemia, in case you are not able to treat yourself. Symptoms may include: Hunger. Anxiety. Sweating and feeling clammy. Dizziness or feeling light-headed. Sleepiness. Increased heart rate. Irritability. Tingling or numbness around the mouth, lips, or tongue. Restless sleep. Severe hypoglycemia is when your blood glucose level is at or below 54 mg/dL (3 mmol/L). Severe hypoglycemia is an emergency. Do not wait to see if the symptoms will go away. Get medical help right away. Call your local emergency services (911 in the [place] Do not drive yourself to the hospital. If you have severe hypoglycemia and you cannot eat or drink, you may need glucagon. A family member or close friend should learn how to check your blood glucose and how to give you glucagon. Ask your health care provider if you need to have an emergency glucagon kit available. Follow these instructions at  home: Medicines Take diabetes medicines as told by your health care provider. If your health care provider prescribed insulin or diabetes medicines, take them every day. Do not run out of insulin or other diabetes medicines. Plan ahead so you always have these available. If you use insulin, adjust your dosage based on your physical activity and what foods you eat. Your health care provider will tell you how to adjust your dosage. Take over-the-counter and prescription medicines only as told by your health care provider. Eating and drinking What you eat and drink affects your blood glucose and your insulin dosage. Making good choices helps to control your diabetes and prevent other health problems. A healthy meal plan includes eating lean proteins, complex carbohydrates, fresh fruits and vegetables, low-fat dairy products, and healthy fats. Make an appointment to see a registered dietitian to help you create an eating plan that is right for you. Make sure that you: Follow instructions from your health care provider about eating or drinking restrictions. Drink enough fluid to keep your urine pale yellow. Keep a record of the carbohydrates that you eat. Do this by reading food labels and learning the standard serving sizes of foods. Follow your sick-day plan whenever you cannot eat or drink as usual. Make this plan in advance with your health care provider.  Activity Stay active. Exercise regularly, as told by your health care provider. This may include: Stretching and doing strength exercises, such as yoga or weight lifting, 2 or more times a week. Doing 150 minutes or more of moderate-intensity or vigorous-intensity exercise each week. This could be brisk walking, biking, or water aerobics. Spread out your activity over 3 or more days of the week. Do not go more than 2 days in a row without doing some kind of physical activity. When you start a new exercise or activity, work with your health  care  provider to adjust your insulin, medicines, or food intake as needed. Lifestyle Do not use any products that contain nicotine or tobacco, such as cigarettes, e-cigarettes, and chewing tobacco. If you need help quitting, ask your health care provider. If your health care provider says that alcohol is safe for you, limit how much you use to no more than 1 drink a day for women who are not pregnant and 2 drinks a day for men. In the [place] one drink equals one 12 oz bottle of beer (355 mL), one 5 oz glass of wine (148 mL), or one 1 oz glass of hard liquor (44 mL). Learn to manage stress. If you need help with this, ask your health care provider. Take care of your body  Keep your immunizations up to date. In addition to getting vaccinations as told by your health care provider, it is recommended that you get vaccinated against the following illnesses: The flu (influenza). Get a flu shot every year. Pneumonia. Hepatitis B. Schedule an eye exam soon after your diagnosis, and then one time every year after that. Check your skin and feet every day for cuts, bruises, redness, blisters, or sores. Schedule a foot exam with your health care provider once every year. Brush your teeth and gums two times a day, and floss one or more times a day. Visit your dentist one or more times every 6 months. Maintain a healthy weight. General instructions Share your diabetes management plan with people in your workplace, school, and household. Carry a medical alert card or wear medical alert jewelry. Keep all follow-up visits as told by your health care provider. This is important. Questions to ask your health care provider Should I meet with a certified diabetes care and education specialist? Where can I find a support group for people with diabetes? Where to find more information American Diabetes Association (ADA): www.diabetes.org American Association of Diabetes Care and Education Specialists (ADCES):  www.diabeteseducator.org International Diabetes Federation (IDF): MemberVerification.ca Summary Caring for yourself after you have been diagnosed with type 2 diabetes (type 2 diabetes mellitus) means keeping your blood sugar (glucose) under control with a balance of nutrition, exercise, lifestyle changes, and medicine. Check your blood glucose every day, as often as told by your health care provider. Having diabetes can put you at risk for other long-term (chronic) conditions, such as heart disease and kidney disease. Your health care provider may prescribe medicines to help prevent complications from diabetes. Share your diabetes management plan with people in your workplace, school, and household. Keep all follow-up visits as told by your health care provider. This is important. This information is not intended to replace advice given to you by your health care provider. Make sure you discuss any questions you have with your health care provider. Document Revised: 01/03/2020 Document Reviewed: 08/23/2019 Elsevier Patient Education  2022 [place],   Merri Ray, MD [facility] 11/25/20 11:53 AM

## 2020-12-03 ENCOUNTER — Telehealth: Payer: Self-pay

## 2020-12-03 NOTE — Telephone Encounter (Signed)
Caller name:Neilan Vicky Mccanless callback [phone]  Encourage patient to contact the pharmacy for refills or they can request refills through [facility]  (Please schedule appointment if patient has not been seen in over a year)  MEDICATION NAME & DOSE: Contour Next Blood Glucose Test Strips   Notes/Comments from patient:Patient has switched machines and needs to change test strips   WHAT PHARMACY WOULD THEY LIKE THIS SENT TO: [facility] on [address] and [address]   Please notify patient: It takes 48-72 hours to process rx refill requests Ask patient to call pharmacy to ensure rx is ready before heading there.   (CLINICAL TO FILL OR ROUTE PER PROTOCOLS)

## 2020-12-04 ENCOUNTER — Other Ambulatory Visit: Payer: Self-pay

## 2020-12-04 DIAGNOSIS — E119 Type 2 diabetes mellitus without complications: Secondary | ICD-10-CM

## 2020-12-04 MED ORDER — GLUCOSE BLOOD VI STRP: ORAL_STRIP | 11 refills | Status: DC

## 2020-12-04 NOTE — Telephone Encounter (Signed)
Patient diabetic test strips have been filled and sent to patient pharmacy.

## 2020-12-08 ENCOUNTER — Encounter: Payer: Self-pay | Admitting: Genetic Counselor

## 2020-12-08 ENCOUNTER — Other Ambulatory Visit: Payer: Self-pay | Admitting: Genetic Counselor

## 2020-12-08 DIAGNOSIS — Z800 Family history of malignant neoplasm of digestive organs: Secondary | ICD-10-CM

## 2020-12-09 ENCOUNTER — Inpatient Hospital Stay: Payer: PRIVATE HEALTH INSURANCE | Attending: Genetic Counselor | Admitting: Genetic Counselor

## 2020-12-09 ENCOUNTER — Telehealth: Payer: Self-pay | Admitting: Family Medicine

## 2020-12-09 ENCOUNTER — Other Ambulatory Visit: Payer: Self-pay

## 2020-12-09 ENCOUNTER — Encounter: Payer: Self-pay | Admitting: Genetic Counselor

## 2020-12-09 ENCOUNTER — Inpatient Hospital Stay: Payer: PRIVATE HEALTH INSURANCE

## 2020-12-09 DIAGNOSIS — Z800 Family history of malignant neoplasm of digestive organs: Secondary | ICD-10-CM

## 2020-12-09 DIAGNOSIS — E119 Type 2 diabetes mellitus without complications: Secondary | ICD-10-CM

## 2020-12-09 MED ORDER — GLUCOSE BLOOD VI STRP: ORAL_STRIP | 12 refills | Status: DC

## 2020-12-09 NOTE — Progress Notes (Signed)
REFERRING PROVIDER: Wendie Agreste, MD [address] [place],  [place] [id number]  PRIMARY PROVIDER:  Wendie Agreste, MD  PRIMARY REASON FOR VISIT:  1. Family history of pancreatic cancer      HISTORY OF PRESENT ILLNESS:   Ian Salazar, a 60 y.o. male, was seen for a [place] cancer genetics consultation at the request of Dr. Carlota Raspberry due to a family history of pancreatic cancer.  Ian Salazar presents to clinic today to discuss the possibility of a hereditary predisposition to cancer, genetic testing, and to further clarify his future cancer risks, as well as potential cancer risks for family members.   Ian Salazar is a 60 y.o. male with no personal history of cancer.    CANCER HISTORY:  Oncology History   No history exists.     Past Medical History:  Diagnosis Date   Abnormal EKG    Normal ETT 5 years ago   Anemia    Family history of pancreatic cancer    GERD (gastroesophageal reflux disease)    occasional   HTN (hypertension)    Hx of adenomatous colonic polyps 03/28/2015   Hyperlipidemia    Knee pain, right    Rotator cuff tendonitis    Left   Type II or unspecified type diabetes mellitus without mention of complication, not stated as uncontrolled     Past Surgical History:  Procedure Laterality Date   COLONOSCOPY W/ POLYPECTOMY  03/25/2015   MULTIPLE TOOTH EXTRACTIONS     UPPER GASTROINTESTINAL ENDOSCOPY  10/17/2019    Social History   Socioeconomic History   Marital status: Married    Spouse name: Not on file   Number of children: 1   Years of education: Not on file   Highest education level: Not on file  Occupational History   Occupation: Paper [employer]  Tobacco Use   Smoking status: Every Day    Packs/day: 0.50    Years: 13.00    Pack years: 6.50    Types: Cigarettes   Smokeless tobacco: Never  Vaping Use   Vaping Use: Never used  Substance and Sexual Activity   Alcohol use: Not Currently    Comment: 09/07/19-none in 2 1/2 years   Drug  use: No   Sexual activity: Yes  Other Topics Concern   Not on file  Social History Narrative   Married.  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    Education: [employer].   Cigarette smoker, no drug use no alcohol since [age]-19      Social Determinants of Health   Financial Resource Strain: Not on file  Food Insecurity: Not on file  Transportation Needs: Not on file  Physical Activity: Not on file  Stress: Not on file  Social Connections: Not on file     FAMILY HISTORY:  We obtained a detailed, 4-generation family history.  Significant diagnoses are listed below: Family History  Problem Relation Age of Onset   Hypertension Mother    Lung cancer Mother 32       smoker   Diabetes Father    Dementia Father    Heart disease Sister    Diabetes Sister    Diabetes Sister    Diabetes Brother    Pancreatic cancer Brother 41   Leukemia Maternal Aunt    Diabetes Maternal Grandmother  Diabetes Other        1st degree relative   Alcohol abuse Neg Hx    Stroke Neg Hx    Hyperlipidemia Neg Hx    Colon cancer Neg Hx    Colon polyps Neg Hx      The patient has one son who is cancer free.  He has two brothers and three sisters.  One brother was diagnosed with pancreatic cancer and was seen in genetics a couple weeks ago.  No testing is back.  Both parents are deceased.  The patient's mother was diagnosed with lung cancer and died at 53.  She had four brothers and three sisters, one sister may have had leukemia.  The maternal grandparents are deceased.  The patient's father died at 41 from dementia.  He reportedly was a foster child and no information is available about his family.  Ian Salazar is aware of previous family history of genetic testing for hereditary cancer risks. Patient's maternal ancestors are of [place] American and [place] descent,  and paternal ancestors are of African American descent. There is no reported Ashkenazi Jewish ancestry. There is no known consanguinity.  GENETIC COUNSELING ASSESSMENT: Ian Salazar is a 60 y.o. male with a family history of pancreatic cancer which is somewhat suggestive of a hereditary cancer syndrome and predisposition to cancer given the diagnosis of pancreatic cancer in a first degree relative. We, therefore, discussed and recommended the following at today's visit.   DISCUSSION: We discussed that, in general, most cancer is not inherited in families, but instead is sporadic or familial. Sporadic cancers occur by chance and typically happen at older ages (>50 years) as this type of cancer is caused by genetic changes acquired during an individual's lifetime. Some families have more cancers than would be expected by chance; however, the ages or types of cancer are not consistent with a known genetic mutation or known genetic mutations have been ruled out. This type of familial cancer is thought to be due to a combination of multiple genetic, environmental, hormonal, and lifestyle factors. While this combination of factors likely increases the risk of cancer, the exact source of this risk is not currently identifiable or testable.  We discussed that up to 15% of pancreatic cancer is hereditary, with most cases associated with BRCA mutations.  There are other genes that can be associated with hereditary pancreatic cancer syndromes.  These include ATM, PALB2 and Lynch syndrome mutations.  We discussed that testing is beneficial for several reasons including knowing how to follow individuals after completing their treatment, identifying whether potential treatment options such as PARP inhibitors would be beneficial, and understand if other family members could be at risk for cancer and allow them to undergo genetic testing.   We reviewed the characteristics, features and inheritance patterns of hereditary  cancer syndromes. We also discussed genetic testing, including the appropriate family members to test, the process of testing, insurance coverage and turn-around-time for results. We discussed the implications of a negative, positive, carrier and/or variant of uncertain significant result. We recommended Ian Salazar pursue genetic testing for the CancerNext-Expanded+ RNAinsight gene panel.   The CancerNext-Expanded gene panel offered by [facility] and includes sequencing and rearrangement analysis for the following 77 genes: AIP, ALK, APC*, ATM*, AXIN2, BAP1, BARD1, BLM, BMPR1A, BRCA1*, BRCA2*, BRIP1*, CDC73, CDH1*, CDK4, CDKN1B, CDKN2A, CHEK2*, CTNNA1, DICER1, FANCC, FH, FLCN, GALNT12, KIF1B, LZTR1, MAX, MEN1, MET, MLH1*, MSH2*, MSH3, MSH6*, MUTYH*, NBN, NF1*, NF2, NTHL1, PALB2*, PHOX2B, PMS2*, POT1, PRKAR1A, PTCH1, PTEN*,  RAD51C*, RAD51D*, RB1, RECQL, RET, SDHA, SDHAF2, SDHB, SDHC, SDHD, SMAD4, SMARCA4, SMARCB1, SMARCE1, STK11, SUFU, TMEM127, TP53*, TSC1, TSC2, VHL and XRCC2 (sequencing and deletion/duplication); EGFR, EGLN1, HOXB13, KIT, MITF, PDGFRA, POLD1, and POLE (sequencing only); EPCAM and GREM1 (deletion/duplication only). DNA and RNA analyses performed for * genes.   Based on Ian Salazar family history of cancer, he meets medical criteria for genetic testing. Despite that he meets criteria, he may still have an out of pocket cost. We discussed that if his out of pocket cost for testing is over $100, the laboratory will call and confirm whether he wants to proceed with testing.  If the out of pocket cost of testing is less than $100 he will be billed by the genetic testing laboratory.   We discussed that he is not the best person in the family to test. In fact, his brother with pancreatic cancer is more appropriate.  This brother reportedly was seen in our clinic last week for testing and is awaiting results.  We discussed holding off on testing until those results come back so we can taylor  our testing to anything that was found.  Mr. Jewel agreed to this plan.  PLAN: Ian Salazar did not wish to pursue genetic testing at today's visit. We understand this decision and remain available to coordinate genetic testing at any time in the future. We, therefore, recommend Ian Salazar continue to follow the cancer screening guidelines given by his primary healthcare provider.  We will discuss testing further once his brother's test results are back.  Lastly, we encouraged Ian Salazar to remain in contact with cancer genetics annually so that we can continuously update the family history and inform him of any changes in cancer genetics and testing that may be of benefit for this family.   Ian Salazar questions were answered to his satisfaction today. Our contact information was provided should additional questions or concerns arise. Thank you for the referral and allowing [place] to share in the care of your patient.   Madalynne Gutmann P. Florene Glen, [place], [facility] Licensed, Insurance risk surveyor [email or web address] phone: [phone]  The patient was seen for a total of 32 minutes in face-to-face genetic counseling.  The patient brought his wife. This patient was discussed with Drs. Magrinat, Lindi Adie and/or Burr Medico who agrees with the above.    _______________________________________________________________________ For Office Staff:  Number of people involved in session: 2 Was an Intern/ student involved with case: no

## 2020-12-09 NOTE — Telephone Encounter (Signed)
Patient test strips have been reordered for the Contour Next Blood brand.

## 2020-12-13 NOTE — Telephone Encounter (Signed)
Called and spoke to patient about his concerns with his test strips. Patient picked up the one touch ultra test strips and did not realize that they were not the Contour Next Blood test strips. I advised him to call his insurance company to see if he can return the unopened bottle  to the pharmacy and get the correct ones because insurance will not cover again until these run out. Patient understood. No further concerns at this time.

## 2020-12-13 NOTE — Telephone Encounter (Signed)
Pt is calling back about the wrong test strips sent to the pharmacy pt requested  Contour Next Blood Glucose Test Strips   this was not sent to the pharmacy can this be corrected and sent pt called the pharmacy and nothing has been sent at this time

## 2021-02-03 ENCOUNTER — Other Ambulatory Visit: Payer: Self-pay | Admitting: Gastroenterology

## 2021-02-27 ENCOUNTER — Ambulatory Visit: Payer: PRIVATE HEALTH INSURANCE | Admitting: Registered Nurse

## 2021-02-27 ENCOUNTER — Encounter: Payer: Self-pay | Admitting: Registered Nurse

## 2021-02-27 ENCOUNTER — Ambulatory Visit: Payer: PRIVATE HEALTH INSURANCE | Admitting: Family Medicine

## 2021-02-27 VITALS — BP 104/59 | HR 86 | Temp 98.4°F | Resp 20 | Ht 69.0 in | Wt 213.4 lb

## 2021-02-27 DIAGNOSIS — E119 Type 2 diabetes mellitus without complications: Secondary | ICD-10-CM

## 2021-02-27 LAB — POCT GLYCOSYLATED HEMOGLOBIN (HGB A1C): Hemoglobin A1C: 7.1 % — AB (ref 4.0–5.6)

## 2021-02-27 NOTE — Patient Instructions (Signed)
Mr. Tullis -   No notes! Keep up the great work  Challenge for you - get the A1c down to 6.5 in the next 3 months.  Let me know if you need anything!  Thanks,  Denice Paradise

## 2021-02-27 NOTE — Progress Notes (Signed)
Established Patient Office Visit  Subjective:  Patient ID: Ian Salazar, male    DOB: Aug 23, 1960  Age: 61 y.o. MRN: [id number]  CC:  Chief Complaint  Patient presents with   Diabetes    HPI [name] presents for t2dm  Last A1c: 7.5 on 11/25/20 Currently taking: metformin 1037m po bid ac No new complications Reports good compliance with medications Diet has been improved Exercise habits have been steady.  BP mildly low today. No symptoms. Feeling well.  Past Medical History:  Diagnosis Date   Abnormal EKG    Normal ETT 5 years ago   Anemia    Family history of pancreatic cancer    GERD (gastroesophageal reflux disease)    occasional   HTN (hypertension)    Hx of adenomatous colonic polyps 03/28/2015   Hyperlipidemia    Knee pain, right    Rotator cuff tendonitis    Left   Type II or unspecified type diabetes mellitus without mention of complication, not stated as uncontrolled     Past Surgical History:  Procedure Laterality Date   COLONOSCOPY W/ POLYPECTOMY  03/25/2015   MULTIPLE TOOTH EXTRACTIONS     UPPER GASTROINTESTINAL ENDOSCOPY  10/17/2019    Family History  Problem Relation Age of Onset   Hypertension Mother    Lung cancer Mother 840      smoker   Diabetes Father    Dementia Father    Heart disease Sister    Diabetes Sister    Diabetes Sister    Diabetes Brother    Pancreatic cancer Brother 720  Leukemia Maternal Aunt    Diabetes Maternal Grandmother    Diabetes Other        1st degree relative   Alcohol abuse Neg Hx    Stroke Neg Hx    Hyperlipidemia Neg Hx    Colon cancer Neg Hx    Colon polyps Neg Hx     Social History   Socioeconomic History   Marital status: Married    Spouse name: Not on file   Number of children: 1   Years of education: Not on file   Highest education level: Not on file  Occupational History   Occupation: Paper [employer]  Tobacco Use   Smoking status: Every Day    Packs/day: 0.50    Years: 13.00     Pack years: 6.50    Types: Cigarettes   Smokeless tobacco: Never  Vaping Use   Vaping Use: Never used  Substance and Sexual Activity   Alcohol use: Not Currently    Comment: 09/07/19-none in 2 1/2 years   Drug use: No   Sexual activity: Yes  Other Topics Concern   Not on file  Social History Narrative   Married.  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 90 tablet 2   blood glucose meter kit and supplies KIT Dispense based on patient and insurance preference. Check your fasting blood sugars daily. (FOR ICD-9 250.00, 250.01). 1 each 0   glucose blood (ONE TOUCH ULTRA TEST) test strip USE TO TEST FASTING BLOOD SUGARS DAILY 100 each 11   glucose blood test strip Use as instructed 100 each 12   lisinopril (ZESTRIL) 10 MG tablet Take 1 tablet (10 mg total) by mouth daily. 90 tablet 2   metFORMIN (GLUCOPHAGE) 1000 MG tablet TAKE 1 TABLET(1000 MG) BY MOUTH TWICE DAILY WITH A MEAL 180 tablet 2   omeprazole (PRILOSEC) 20 MG capsule TAKE 1 CAPSULE(20 MG) BY MOUTH DAILY 90 capsule 0   ONETOUCH DELICA LANCETS 05L MISC USE TO TEST FASTING BLOOD SUGARS DAILY 100 each 11   No facility-administered medications prior to visit.    No Known Allergies  ROS Review of Systems  Constitutional: Negative.   HENT: Negative.    Eyes: Negative.   Respiratory:  Negative.    Cardiovascular: Negative.   Gastrointestinal: Negative.   Genitourinary: Negative.   Musculoskeletal: Negative.   Skin: Negative.   Neurological: Negative.   Psychiatric/Behavioral: Negative.    All other systems reviewed and are negative.    Objective:    Physical Exam Constitutional:      General: He is not in acute distress.    Appearance: Normal appearance. He is normal weight. He is not ill-appearing, toxic-appearing or diaphoretic.  Cardiovascular:     Rate and Rhythm: Normal rate and regular rhythm.     Heart sounds: Normal heart sounds. No murmur heard.   No friction rub. No gallop.  Pulmonary:     Effort: Pulmonary effort is normal. No respiratory distress.     Breath sounds: Normal breath sounds. No stridor. No wheezing, rhonchi or rales.  Chest:     Chest wall: No tenderness.  Neurological:     General: No focal deficit present.     Mental Status: He is alert and oriented to person, place, and time. Mental status is at baseline.  Psychiatric:        Mood and Affect: Mood normal.        Behavior: Behavior normal.        Thought Content: Thought content normal.        Judgment: Judgment normal.    BP (!) 104/59    Pulse 86    Temp 98.4 F (36.9 C) (Temporal)    Resp 20    Ht _0  (1.753 m)    Wt 213 lb 6 oz (96.8 kg)    SpO2 100%    BMI 31.51 kg/m  Wt Readings from Last 3 Encounters:  02/27/21 213 lb 6 oz (96.8 kg)  11/25/20 210 lb 6.4 oz (95.4 kg)  09/23/20 218 lb 9.6 oz (99.2 kg)     Health Maintenance Due  Topic Date Due   COVID-19 Vaccine (3 - Booster for Pfizer series) 07/13/2019    There are no preventive care reminders to display for this patient.  Lab Results  Component Value Date   TSH 0.786 01/14/2015   Lab Results  Component Value Date   WBC 5.0 01/14/2015   HGB 13.8 01/14/2015   HCT 42.0 01/14/2015   MCV 84.7 01/14/2015   PLT 199 01/14/2015   Lab Results  Component Value Date   NA 140 11/25/2020   K 4.0 11/25/2020  CO2 28 11/25/2020   GLUCOSE 136 (H) 11/25/2020   BUN 12 11/25/2020   CREATININE 0.83 11/25/2020   BILITOT 0.3 11/25/2020   ALKPHOS 62 11/25/2020   AST 17 11/25/2020   ALT 14 11/25/2020   PROT 7.0 11/25/2020   ALBUMIN 4.3 11/25/2020   CALCIUM 9.2 11/25/2020   GFR 95.05 11/25/2020   Lab Results  Component Value Date   CHOL 111 08/09/2020   Lab Results  Component Value Date   HDL 44.30 08/09/2020   Lab Results  Component Value Date   LDLCALC 57 08/09/2020   Lab Results  Component Value Date   TRIG 48.0 08/09/2020   Lab Results  Component Value Date   CHOLHDL 2 08/09/2020   Lab Results  Component Value Date   HGBA1C 7.1 (A) 02/27/2021      Assessment & Plan:   Problem List Items Addressed This Visit   None Visit Diagnoses     Controlled type 2 diabetes mellitus without complication, without long-term current use of insulin (HCC)    -  Primary   Relevant Orders   POCT glycosylated hemoglobin (Hb A1C) (Completed)       No orders of the defined types were placed in this encounter.   Follow-up: Return in about 3 months (around 05/28/2021) for t2dm, htn, hld.   PLAN Continue current regimen. No changes warranted Encouraged pt to continue healthy lifestyle choices. Return in 3 mo Patient encouraged to call clinic with any questions, comments, or concerns.  Maximiano Coss, NP

## 2021-05-10 ENCOUNTER — Other Ambulatory Visit: Payer: Self-pay | Admitting: Gastroenterology

## 2021-05-12 ENCOUNTER — Telehealth: Payer: Self-pay

## 2021-05-12 NOTE — Telephone Encounter (Signed)
Patient stated that his Ian Salazar says he is due for colonoscopy in 07-2021. He he said that he thought he needed one done around November 2022 but he never got a call and the date on his mychart was changed as well.  ? ?Patient would like to know if he needs to have colonoscopy done this year and also if he needs to add the EGD along with it as well. Please advise ?

## 2021-05-15 NOTE — Telephone Encounter (Signed)
LVM for patient to call back. He will need a previst appt and EGD/Colon appt scheduled ? ?Mychart message sent as well ?

## 2021-05-15 NOTE — Telephone Encounter (Signed)
Records reviewed.  Yes, patient is due for repeat colonoscopy for ongoing polyp surveillance.  Also recommend repeat EGD at the same time for previously noted gastric intestinal metaplasia, with plan to perform GIM mapping biopsies of the stomach.  Please schedule for EGD/colonoscopy.  Thank you. ?

## 2021-05-16 ENCOUNTER — Encounter: Payer: Self-pay | Admitting: Gastroenterology

## 2021-05-16 NOTE — Telephone Encounter (Signed)
Previsit 5-5 and egd/colon 5-26 ?

## 2021-05-29 ENCOUNTER — Other Ambulatory Visit: Payer: Self-pay

## 2021-05-29 ENCOUNTER — Ambulatory Visit (INDEPENDENT_AMBULATORY_CARE_PROVIDER_SITE_OTHER): Payer: PRIVATE HEALTH INSURANCE | Admitting: Family Medicine

## 2021-05-29 VITALS — BP 114/58 | HR 72 | Temp 97.8°F | Resp 18 | Ht 69.0 in | Wt 211.8 lb

## 2021-05-29 DIAGNOSIS — I10 Essential (primary) hypertension: Secondary | ICD-10-CM | POA: Diagnosis not present

## 2021-05-29 DIAGNOSIS — E782 Mixed hyperlipidemia: Secondary | ICD-10-CM

## 2021-05-29 DIAGNOSIS — E1165 Type 2 diabetes mellitus with hyperglycemia: Secondary | ICD-10-CM | POA: Diagnosis not present

## 2021-05-29 DIAGNOSIS — H6123 Impacted cerumen, bilateral: Secondary | ICD-10-CM

## 2021-05-29 LAB — LIPID PANEL
Cholesterol: 112 mg/dL (ref 0–200)
HDL: 53.8 mg/dL (ref >39.00)
LDL Cholesterol: 50 mg/dL (ref 0–99)
NonHDL: 58.33
Total CHOL/HDL Ratio: 2
Triglycerides: 43 mg/dL (ref 0.0–149.0)
VLDL: 8.6 mg/dL (ref 0.0–40.0)

## 2021-05-29 LAB — COMPREHENSIVE METABOLIC PANEL
ALT: 14 U/L (ref 0–53)
AST: 16 U/L (ref 0–37)
Albumin: 4.5 g/dL (ref 3.5–5.2)
Alkaline Phosphatase: 61 U/L (ref 39–117)
BUN: 11 mg/dL (ref 6–23)
CO2: 28 mEq/L (ref 19–32)
Calcium: 9.3 mg/dL (ref 8.4–10.5)
Chloride: 103 mEq/L (ref 96–112)
Creatinine, Ser: 0.8 mg/dL (ref 0.40–1.50)
GFR: 95.77 mL/min (ref >60.00)
Glucose, Bld: 134 mg/dL — ABNORMAL HIGH (ref 70–99)
Potassium: 3.8 mEq/L (ref 3.5–5.1)
Sodium: 139 mEq/L (ref 135–145)
Total Bilirubin: 0.4 mg/dL (ref 0.2–1.2)
Total Protein: 7.1 g/dL (ref 6.0–8.3)

## 2021-05-29 LAB — HEMOGLOBIN A1C: Hgb A1c MFr Bld: 7.5 % — ABNORMAL HIGH (ref 4.6–6.5)

## 2021-05-29 MED ORDER — METFORMIN HCL 1000 MG PO TABS: ORAL_TABLET | ORAL | 2 refills | Status: DC

## 2021-05-29 MED ORDER — LISINOPRIL 10 MG PO TABS: 10.0000 mg | ORAL_TABLET | Freq: Every day | ORAL | 2 refills | Status: DC

## 2021-05-29 MED ORDER — ATORVASTATIN CALCIUM 20 MG PO TABS: 20.0000 mg | ORAL_TABLET | Freq: Every day | ORAL | 2 refills | Status: DC

## 2021-05-29 NOTE — Patient Instructions (Addendum)
?Continue same medications for now.  Keep up the good work with watching diet including avoiding late night meals.  See information below on excess earwax.  You have the option of trying over-the-counter treatments every 6 to 9 months to see if that would prevent it from completely blocking up.  Follow-up in 3 months for recheck diabetes.  Take care ? ?Earwax Buildup, Adult ?The ears produce a substance called earwax that helps keep bacteria out of the ear and protects the skin in the ear canal. Occasionally, earwax can build up in the ear and cause discomfort or hearing loss. ?What are the causes? ?This condition is caused by a buildup of earwax. Ear canals are self-cleaning. Ear wax is made in the outer part of the ear canal and generally falls out in small amounts over time. ?When the self-cleaning mechanism is not working, earwax builds up and can cause decreased hearing and discomfort. Attempting to clean ears with cotton swabs can push the earwax deep into the ear canal and cause decreased hearing and pain. ?What increases the risk? ?This condition is more likely to develop in people who: ?Clean their ears often with cotton swabs. ?Pick at their ears. ?Use earplugs or in-ear headphones often, or wear hearing aids. ?The following factors may also make you more likely to develop this condition: ?Being male. ?Being of older age. ?Naturally producing more earwax. ?Having narrow ear canals. ?Having earwax that is overly thick or sticky. ?Having excess hair in the ear canal. ?Having eczema. ?Being dehydrated. ?What are the signs or symptoms? ?Symptoms of this condition include: ?Reduced or muffled hearing. ?A feeling of fullness in the ear or feeling that the ear is plugged. ?Fluid coming from the ear. ?Ear pain or an itchy ear. ?Ringing in the ear. ?Coughing. ?Balance problems. ?An obvious piece of earwax that can be seen inside the ear canal. ?How is this diagnosed? ?This condition may be diagnosed based  on: ?Your symptoms. ?Your medical history. ?An ear exam. During the exam, your health care provider will look into your ear with an instrument called an otoscope. ?You may have tests, including a hearing test. ?How is this treated? ?This condition may be treated by: ?Using ear drops to soften the earwax. ?Having the earwax removed by a health care provider. The health care provider may: ?Flush the ear with water. ?Use an instrument that has a loop on the end (curette). ?Use a suction device. ?Having surgery to remove the wax buildup. This may be done in severe cases. ?Follow these instructions at home: ? ?Take over-the-counter and prescription medicines only as told by your health care provider. ?Do not put any objects, including cotton swabs, into your ear. You can clean the opening of your ear canal with a washcloth or facial tissue. ?Follow instructions from your health care provider about cleaning your ears. Do not overclean your ears. ?Drink enough fluid to keep your urine pale yellow. This will help to thin the earwax. ?Keep all follow-up visits as told. If earwax builds up in your ears often or if you use hearing aids, consider seeing your health care provider for routine, preventive ear cleanings. Ask your health care provider how often you should schedule your cleanings. ?If you have hearing aids, clean them according to instructions from the manufacturer and your health care provider. ?Contact a health care provider if: ?You have ear pain. ?You develop a fever. ?You have pus or other fluid coming from your ear. ?You have hearing loss. ?You have  ringing in your ears that does not go away. ?You feel like the room is spinning (vertigo). ?Your symptoms do not improve with treatment. ?Get help right away if: ?You have bleeding from the affected ear. ?You have severe ear pain. ?Summary ?Earwax can build up in the ear and cause discomfort or hearing loss. ?The most common symptoms of this condition include  reduced or muffled hearing, a feeling of fullness in the ear, or feeling that the ear is plugged. ?This condition may be diagnosed based on your symptoms, your medical history, and an ear exam. ?This condition may be treated by using ear drops to soften the earwax or by having the earwax removed by a health care provider. ?Do not put any objects, including cotton swabs, into your ear. You can clean the opening of your ear canal with a washcloth or facial tissue. ?This information is not intended to replace advice given to you by your health care provider. Make sure you discuss any questions you have with your health care provider. ?Document Revised: 05/09/2019 Document Reviewed: 05/09/2019 ?Elsevier Patient Education ? [place]. ? ? ? ?If you have lab work done today you will be contacted with your lab results within the next 2 weeks.  If you have not heard from [place] then please contact us. The fastest way to get your results is to register for My Chart. ? ? ?IF you received an x-ray today, you will receive an invoice from [facility]. Please contact [facility] at [phone] with questions or concerns regarding your invoice.  ? ?IF you received labwork today, you will receive an invoice from [facility]. Please contact [facility] at [phone] with questions or concerns regarding your invoice.  ? ?Our billing staff will not be able to assist you with questions regarding bills from these companies. ? ?You will be contacted with the lab results as soon as they are available. The fastest way to get your results is to activate your My Chart account. Instructions are located on the last page of this paperwork. If you have not heard from [place] regarding the results in 2 weeks, please contact this office. ?  ? ? ?

## 2021-05-29 NOTE — Progress Notes (Addendum)
? ?Subjective:  ?Patient ID: Ian Salazar, male    DOB: 01/29/1961  Age: 61 y.o. MRN: [id number] ? ?CC:  ?Chief Complaint  ?Patient presents with  ? Follow-up  ?  Patient states he is here for follow up on diabetes.  ? Cerumen Impaction  ?  Patient states he need ears looked at  ?  ? ? ?HPI ?Ian Salazar presents for  ? ?Diabetes: ?With hyperglycemia.  Treated with metformin 1000 mg twice daily, he is on ACE inhibitor and statin.  No med side effects. Has also lost weight with diet adjustments.  Lipase normal at October visit.  Referred to genetics to decide on testing given family history of pancreatic cancer ?Appt with Rich in January. Aic improved. Still avoiding late night meals.  ?Walking dogs, yardwork for exercise.  ?Wt Readings from Last 3 Encounters:  ?05/29/21 211 lb 12.8 oz (96.1 kg)  ?02/27/21 213 lb 6 oz (96.8 kg)  ?11/25/20 210 lb 6.4 oz (95.4 kg)  ? ?Microalbumin: Normal ratio 11/25/2020 ?Optho, foot exam, pneumovax: Up-to-date ? ?Lab Results  ?Component Value Date  ? HGBA1C 7.1 (A) 02/27/2021  ? HGBA1C 7.5 (H) 11/25/2020  ? HGBA1C 7.3 (H) 08/09/2020  ? ?Lab Results  ?Component Value Date  ? MICROALBUR <0.7 11/25/2020  ? LDLCALC 57 08/09/2020  ? CREATININE 0.83 11/25/2020  ? ?Hypertension.  ?Lisinopril 10mg qd.  ?No lightheadedness/dizziness.  ?Home readings at work stable/normal. No side effects with meds.  ?BP Readings from Last 3 Encounters:  ?05/29/21 (!) 114/58  ?02/27/21 (!) 104/59  ?11/25/20 118/70  ? ?Lab Results  ?Component Value Date  ? CREATININE 0.83 11/25/2020  ? ?Hyperlipidemia: ?Lipitor 20mg qd. No new myalgias.  ?Lab Results  ?Component Value Date  ? CHOL 111 08/09/2020  ? HDL 44.30 08/09/2020  ? LDLCALC 57 08/09/2020  ? TRIG 48.0 08/09/2020  ? CHOLHDL 2 08/09/2020  ? ?Lab Results  ?Component Value Date  ? ALT 14 11/25/2020  ? AST 17 11/25/2020  ? ALKPHOS 62 11/25/2020  ? BILITOT 0.3 11/25/2020  ? ?Cerumen impaction ?Past 3 weeks feel blocked. No pain, no d/c. Feels muffled,  improves with pulling on ear or change in position.  ?Tx: otc wax remover few weeks ago - no relief.  ?No ear plugs,no qtips.  ?Last flushed about a year ago.  ? ? ?History ?Patient Active Problem List  ? Diagnosis Date Noted  ? Family history of pancreatic cancer 12/09/2020  ? Uncontrolled type 2 diabetes mellitus with insulin therapy 04/22/2017  ? Hx of adenomatous colonic polyps 03/28/2015  ? Routine general medical examination at a health care facility 07/17/2011  ? Mixed hyperlipidemia 07/17/2011  ? Abnormal chest x-ray 07/17/2011  ? Vitamin B 12 deficiency 04/22/2010  ? ANEMIA-NOS 04/18/2010  ? TOBACCO USE 04/18/2010  ? DEGENERATIVE JOINT DISEASE, KNEE 10/24/2008  ? DM w/o complication type II 02/27/2008  ? ?Past Medical History:  ?Diagnosis Date  ? Abnormal EKG   ? Normal ETT 5 years ago  ? Anemia   ? Family history of pancreatic cancer   ? GERD (gastroesophageal reflux disease)   ? occasional  ? HTN (hypertension)   ? Hx of adenomatous colonic polyps 03/28/2015  ? Hyperlipidemia   ? Knee pain, right   ? Rotator cuff tendonitis   ? Left  ? Type II or unspecified type diabetes mellitus without mention of complication, not stated as uncontrolled   ? ?Past Surgical History:  ?Procedure Laterality Date  ? COLONOSCOPY W/ POLYPECTOMY    03/25/2015  ? MULTIPLE TOOTH EXTRACTIONS    ? UPPER GASTROINTESTINAL ENDOSCOPY  10/17/2019  ? ?No Known Allergies ?Prior to Admission medications   ?Medication Sig Start Date End Date Taking? Authorizing Provider  ?atorvastatin (LIPITOR) 20 MG tablet Take 1 tablet (20 mg total) by mouth daily. 08/09/20  Yes Wendie Agreste, MD  ?blood glucose meter kit and supplies KIT Dispense based on patient and insurance preference. Check your fasting blood sugars daily. (FOR ICD-9 250.00, 250.01). 12/12/16  Yes Jaynee Eagles, PA-C  ?glucose blood (ONE TOUCH ULTRA TEST) test strip USE TO TEST FASTING BLOOD SUGARS DAILY 12/04/20  Yes Wendie Agreste, MD  ?glucose blood test strip Use as instructed  12/09/20  Yes Wendie Agreste, MD  ?lisinopril (ZESTRIL) 10 MG tablet Take 1 tablet (10 mg total) by mouth daily. 08/09/20  Yes Wendie Agreste, MD  ?metFORMIN (GLUCOPHAGE) 1000 MG tablet TAKE 1 TABLET(1000 MG) BY MOUTH TWICE DAILY WITH A MEAL 08/09/20  Yes Wendie Agreste, MD  ?omeprazole (PRILOSEC) 20 MG capsule TAKE 1 CAPSULE(20 MG) BY MOUTH DAILY 05/12/21  Yes Cirigliano, Vito V, DO  ?ONETOUCH DELICA LANCETS 80X MISC USE TO TEST FASTING BLOOD SUGARS DAILY 02/23/17  Yes Jaynee Eagles, PA-C  ? ?Social History  ? ?Socioeconomic History  ? Marital status: Married  ?  Spouse name: Not on file  ? Number of children: 1  ? Years of education: Not on file  ? Highest education level: Not on file  ?Occupational History  ? Occupation: [employer]  ?Tobacco Use  ? Smoking status: Every Day  ?  Packs/day: 0.50  ?  Years: 13.00  ?  Pack years: 6.50  ?  Types: Cigarettes  ? Smokeless tobacco: Never  ?Vaping Use  ? Vaping Use: Never used  ?Substance and Sexual Activity  ? Alcohol use: Not Currently  ?  Comment: 09/07/19-none in 2 1/2 years  ? Drug use: No  ? Sexual activity: Yes  ?Other Topics Concern  ? Not on file  ?Social History Narrative  ? Married.  1 child  ? 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 Regular Exercise -  NO.   ? Education: [employer].  ? Cigarette smoker, no drug use no alcohol since 20 18-19  ?   ? ?Social Determinants of Health  ? ?Financial Resource Strain: Not on file  ?Food Insecurity: Not on file  ?Transportation Needs: Not on file  ?Physical Activity: Not on file  ?Stress: Not on file  ?Social Connections: Not on file  ?Intimate Partner Violence: Not on file  ? ? ?Review of Systems  ?Constitutional:  Negative for fatigue and unexpected weight change.  ?HENT:  Positive for hearing loss. Negative for ear discharge and ear pain.   ?Eyes:  Negative for visual disturbance.  ?Respiratory:  Negative for cough, chest  tightness and shortness of breath.   ?Cardiovascular:  Negative for chest pain, palpitations and leg swelling.  ?Gastrointestinal:  Negative for abdominal pain and blood in stool.  ?Neurological:  Negative for dizziness, light-headedness and headaches.  ? ? ?Objective:  ? ?Vitals:  ? 05/29/21 0918  ?BP: (!) 114/58  ?Pulse: 72  ?Resp: 18  ?Temp: 97.8 ?F (36.6 ?C)  ?TempSrc: Temporal  ?SpO2: 96%  ?Weight: 211 lb 12.8 oz (96.1 kg)  ?Height: 5' 9" (1.753 m)  ? ? ? ?Physical Exam ?Vitals reviewed.  ?Constitutional:   ?  Appearance: He is well-developed.  ?HENT:  ?   Head: Normocephalic and atraumatic.  ?   Right Ear: There is impacted cerumen.  ?   Left Ear: There is impacted cerumen.  ?Neck:  ?   Vascular: No carotid bruit or JVD.  ?Cardiovascular:  ?   Rate and Rhythm: Normal rate and regular rhythm.  ?   Heart sounds: Normal heart sounds. No murmur heard. ?Pulmonary:  ?   Effort: Pulmonary effort is normal.  ?   Breath sounds: Normal breath sounds. No rales.  ?Musculoskeletal:  ?   Right lower leg: No edema.  ?   Left lower leg: No edema.  ?Skin: ?   General: Skin is warm and dry.  ?Neurological:  ?   Mental Status: He is alert and oriented to person, place, and time.  ?Psychiatric:     ?   Mood and Affect: Mood normal.  ? ?Discussed ear lavage, verbal consent. Ear lavage performed to bilateral ear canals with successful cerumen removal, no complications.  Patient tolerated procedure well.  Hearing improved afterwards. ? ?Assessment & Plan:  ?STEVE YOUNGBERG is a 61 y.o. male . ?Type 2 diabetes mellitus with hyperglycemia, without long-term current use of insulin (HCC) - Plan: Hemoglobin A1c ?Check labs with medication adjustment accordingly based on results.  Continue metformin for now.  Commended on dietary changes. ? ?Essential hypertension - Plan: Comprehensive metabolic panel, metFORMIN (GLUCOPHAGE) 1000 MG tablet, lisinopril (ZESTRIL) 10 MG tablet ? -Stable, continue lisinopril same dose.  Labs  ordered. ? ?Mixed hyperlipidemia - Plan: Lipid panel, atorvastatin (LIPITOR) 20 MG tablet ? -Tolerating statin, continue same dose, labs ordered as above.  Adjust meds accordingly. ? ?Bilateral impacted cerumen - Plan: E

## 2021-05-30 ENCOUNTER — Encounter: Payer: Self-pay | Admitting: Family Medicine

## 2021-06-06 ENCOUNTER — Ambulatory Visit (AMBULATORY_SURGERY_CENTER): Payer: PRIVATE HEALTH INSURANCE | Admitting: *Deleted

## 2021-06-06 VITALS — Ht 69.5 in | Wt 213.0 lb

## 2021-06-06 DIAGNOSIS — Z8719 Personal history of other diseases of the digestive system: Secondary | ICD-10-CM

## 2021-06-06 DIAGNOSIS — Z86010 Personal history of colonic polyps: Secondary | ICD-10-CM

## 2021-06-06 NOTE — Progress Notes (Signed)
No egg or soy allergy known to patient  ?No issues known to pt with past sedation with any surgeries or procedures ?Patient denies ever being told they had issues or difficulty with intubation  ?No FH of Malignant Hyperthermia ?Pt is not on diet pills ?Pt is not on  home 02  ?Pt is not on blood thinners  ?Pt denies issues with constipation  ?No A fib or A flutter ? ? Coupon to pt in PV today , Code to Pharmacy and  NO PA's for preps discussed with pt In PV today  ?Discussed with pt there will be an out-of-pocket cost for prep and that varies from $0 to 70 +  dollars - pt verbalized understanding  ?Pt instructed to use Singlecare.com or GoodRx for a price reduction on prep  ? ?PV completed over the phone. Pt verified name, DOB, address and insurance during PV today.  ?Pt mailed instruction packet with copy of consent form to read and not return, and instructions.  ?Pt encouraged to call with questions or issues.  ?If pt has My chart, procedure instructions sent via My Chart  ?Insurance confirmed with pt at [facility] today  ? ?Sample sheet of over the counter items to purchase for prep mailed with packet.  ?

## 2021-06-20 ENCOUNTER — Encounter: Payer: Self-pay | Admitting: Certified Registered Nurse Anesthetist

## 2021-06-25 ENCOUNTER — Encounter: Payer: Self-pay | Admitting: Gastroenterology

## 2021-06-27 ENCOUNTER — Encounter: Payer: Self-pay | Admitting: Gastroenterology

## 2021-06-27 ENCOUNTER — Ambulatory Visit (AMBULATORY_SURGERY_CENTER): Payer: PRIVATE HEALTH INSURANCE | Admitting: Gastroenterology

## 2021-06-27 VITALS — BP 99/51 | HR 72 | Temp 98.4°F | Resp 15 | Ht 69.5 in | Wt 213.0 lb

## 2021-06-27 DIAGNOSIS — K2950 Unspecified chronic gastritis without bleeding: Secondary | ICD-10-CM | POA: Diagnosis not present

## 2021-06-27 DIAGNOSIS — D124 Benign neoplasm of descending colon: Secondary | ICD-10-CM | POA: Diagnosis not present

## 2021-06-27 DIAGNOSIS — K2970 Gastritis, unspecified, without bleeding: Secondary | ICD-10-CM | POA: Diagnosis not present

## 2021-06-27 DIAGNOSIS — K5730 Diverticulosis of large intestine without perforation or abscess without bleeding: Secondary | ICD-10-CM

## 2021-06-27 DIAGNOSIS — Z09 Encounter for follow-up examination after completed treatment for conditions other than malignant neoplasm: Secondary | ICD-10-CM

## 2021-06-27 DIAGNOSIS — K31A0 Gastric intestinal metaplasia, unspecified: Secondary | ICD-10-CM | POA: Diagnosis not present

## 2021-06-27 DIAGNOSIS — Z86010 Personal history of colonic polyps: Secondary | ICD-10-CM | POA: Diagnosis not present

## 2021-06-27 DIAGNOSIS — K219 Gastro-esophageal reflux disease without esophagitis: Secondary | ICD-10-CM

## 2021-06-27 DIAGNOSIS — K3189 Other diseases of stomach and duodenum: Secondary | ICD-10-CM | POA: Diagnosis not present

## 2021-06-27 MED ORDER — SODIUM CHLORIDE 0.9 % IV SOLN: 500.0000 mL | Freq: Once | INTRAVENOUS | Status: DC

## 2021-06-27 NOTE — Progress Notes (Signed)
Pt's states no medical or surgical changes since previsit or office visit. 

## 2021-06-27 NOTE — Op Note (Signed)
Ian Salazar Patient Name: Ian Salazar Procedure Date: 06/27/2021 1:14 PM MRN: [id number] Endoscopist: Gerrit Heck , MD Age: 61 Referring MD:  Date of Birth: Jun 05, 1960 Gender: Male Account #: [id number] Procedure:                Upper GI endoscopy with biopsies per GIM mapping                            protocol Indications:              Esophageal reflux, Follow-up of gastritis and                            intestinal metaplasia                           EGD (10/17/2019): Normal esophagus, mild gastritis                            with single linear erosion in the antrum (gastric                            intestinal metaplasia), mild peptic duodenitis Medicines:                Monitored Anesthesia Care Procedure:                Pre-Anesthesia Assessment:                           - Prior to the procedure, a History and Physical                            was performed, and patient medications and                            allergies were reviewed. The patient's tolerance of                            previous anesthesia was also reviewed. The risks                            and benefits of the procedure and the sedation                            options and risks were discussed with the patient.                            All questions were answered, and informed consent                            was obtained. Prior Anticoagulants: The patient has                            taken no previous anticoagulant or antiplatelet  agents. ASA Grade Assessment: II - A patient with                            mild systemic disease. After reviewing the risks                            and benefits, the patient was deemed in                            satisfactory condition to undergo the procedure.                           After obtaining informed consent, the endoscope was                            passed under direct vision. Throughout the                             procedure, the patient's blood pressure, pulse, and                            oxygen saturations were monitored continuously. The                            Endoscope was introduced through the mouth, and                            advanced to the second part of duodenum. The upper                            GI endoscopy was accomplished without difficulty.                            The patient tolerated the procedure well. Scope In: Scope Out: Findings:                 The examined esophagus was normal.                           The entire examined stomach was normal. Several                            mucosal biopsies were taken throughout the stomach                            with a cold forceps for histology per GIM mapping                            protocol. Estimated blood loss was minimal.                           The duodenal bulb, first portion of the duodenum  and second portion of the duodenum were normal. Complications:            No immediate complications. Estimated Blood Loss:     Estimated blood loss was minimal. Impression:               - Normal esophagus.                           - Normal stomach. Biopsied.                           - Normal duodenal bulb, first portion of the                            duodenum and second portion of the duodenum. Recommendation:           - Patient has a contact number available for                            emergencies. The signs and symptoms of potential                            delayed complications were discussed with the                            patient. Return to normal activities tomorrow.                            Written discharge instructions were provided to the                            patient.                           - Resume previous diet.                           - Continue present medications.                           - Await pathology results.                            - Repeat upper endoscopy for surveillance based on                            pathology results.                           - Colonoscopy today. Gerrit Heck, MD 06/27/2021 1:57:34 PM

## 2021-06-27 NOTE — Progress Notes (Signed)
1300 Robinul 0.1 mg IV given due large amount of secretions upon assessment.  MD made aware, vss

## 2021-06-27 NOTE — Patient Instructions (Signed)
Handouts given for polyps and diverticulosis.  Await pathology results.  Resume previous diet and continue current medications.   YOU HAD AN ENDOSCOPIC PROCEDURE TODAY AT [facility]:   Refer to the procedure report that was given to you for any specific questions about what was found during the examination.  If the procedure report does not answer your questions, please call your gastroenterologist to clarify.  If you requested that your care partner not be given the details of your procedure findings, then the procedure report has been included in a sealed envelope for you to review at your convenience later.  YOU SHOULD EXPECT: Some feelings of bloating in the abdomen. Passage of more gas than usual.  Walking can help get rid of the air that was put into your GI tract during the procedure and reduce the bloating. If you had a lower endoscopy (such as a colonoscopy or flexible sigmoidoscopy) you may notice spotting of blood in your stool or on the toilet paper. If you underwent a bowel prep for your procedure, you may not have a normal bowel movement for a few days.  Please Note:  You might notice some irritation and congestion in your nose or some drainage.  This is from the oxygen used during your procedure.  There is no need for concern and it should clear up in a day or so.  SYMPTOMS TO REPORT IMMEDIATELY:  Following lower endoscopy (colonoscopy or flexible sigmoidoscopy):  Excessive amounts of blood in the stool  Significant tenderness or worsening of abdominal pains  Swelling of the abdomen that is new, acute  Fever of 100F or higher  Following upper endoscopy (EGD)  Vomiting of blood or coffee ground material  New chest pain or pain under the shoulder blades  Painful or persistently difficult swallowing  New shortness of breath  Fever of 100F or higher  Black, tarry-looking stools  For urgent or emergent issues, a gastroenterologist can be reached at any hour by  calling [phone]. Do not use MyChart messaging for urgent concerns.    DIET:  We do recommend a small meal at first, but then you may proceed to your regular diet.  Drink plenty of fluids but you should avoid alcoholic beverages for 24 hours.  ACTIVITY:  You should plan to take it easy for the rest of today and you should NOT DRIVE or use heavy machinery until tomorrow (because of the sedation medicines used during the test).    FOLLOW UP: Our staff will call the number listed on your records 48-72 hours following your procedure to check on you and address any questions or concerns that you may have regarding the information given to you following your procedure. If we do not reach you, we will leave a message.  We will attempt to reach you two times.  During this call, we will ask if you have developed any symptoms of COVID 19. If you develop any symptoms (ie: fever, flu-like symptoms, shortness of breath, cough etc.) before then, please call [phone].  If you test positive for Covid 19 in the 2 weeks post procedure, please call and report this information to [place].    If any biopsies were taken you will be contacted by phone or by letter within the next 1-3 weeks.  Please call us at [phone] if you have not heard about the biopsies in 3 weeks.    SIGNATURES/CONFIDENTIALITY: You and/or your care partner have signed paperwork which will be entered  into your electronic medical record.  These signatures attest to the fact that that the information above on your After Visit Summary has been reviewed and is understood.  Full responsibility of the confidentiality of this discharge information lies with you and/or your care-partner.

## 2021-06-27 NOTE — Op Note (Signed)
[facility] Patient Name: Ian Salazar Procedure Date: 06/27/2021 1:14 PM MRN: [id number] Endoscopist: Gerrit Heck , MD Age: 61 Referring MD:  Date of Birth: 12-29-1960 Gender: Male Account #: [id number] Procedure:                Colonoscopy Indications:              Surveillance: Personal history of adenomatous                            polyps on last colonoscopy 5 years ago                           Colonoscopy (03/25/2015): 2 diminutive tubular                            adenomas Medicines:                Monitored Anesthesia Care Procedure:                Pre-Anesthesia Assessment:                           - Prior to the procedure, a History and Physical                            was performed, and patient medications and                            allergies were reviewed. The patient's tolerance of                            previous anesthesia was also reviewed. The risks                            and benefits of the procedure and the sedation                            options and risks were discussed with the patient.                            All questions were answered, and informed consent                            was obtained. Prior Anticoagulants: The patient has                            taken no previous anticoagulant or antiplatelet                            agents. ASA Grade Assessment: II - A patient with                            mild systemic disease. After reviewing the risks  and benefits, the patient was deemed in                            satisfactory condition to undergo the procedure.                           After obtaining informed consent, the colonoscope                            was passed under direct vision. Throughout the                            procedure, the patient's blood pressure, pulse, and                            oxygen saturations were monitored continuously. The                             CF [id number] #[id number] was introduced through the anus                            and advanced to the the terminal ileum. The                            colonoscopy was performed without difficulty. The                            patient tolerated the procedure well. The quality                            of the bowel preparation was good. The terminal                            ileum, ileocecal valve, appendiceal orifice, and                            rectum were photographed. Scope In: 1:33:57 PM Scope Out: 1:52:48 PM Scope Withdrawal Time: 0 hours 13 minutes 51 seconds  Total Procedure Duration: 0 hours 18 minutes 51 seconds  Findings:                 The perianal and digital rectal examinations were                            normal.                           A 6 mm polyp was found in the descending colon. The                            polyp was sessile. The polyp was removed with a                            cold snare. Resection and retrieval were complete.  Estimated blood loss was minimal.                           Multiple small-mouthed diverticula were found in                            the sigmoid colon and ascending colon.                           The retroflexed view of the distal rectum and anal                            verge was normal and showed no anal or rectal                            abnormalities.                           The terminal ileum appeared normal. Complications:            No immediate complications. Estimated Blood Loss:     Estimated blood loss was minimal. Impression:               - One 6 mm polyp in the descending colon, removed                            with a cold snare. Resected and retrieved.                           - Diverticulosis in the sigmoid colon and in the                            ascending colon.                           - The distal rectum and anal verge are normal on                             retroflexion view.                           - The examined portion of the ileum was normal. Recommendation:           - Patient has a contact number available for                            emergencies. The signs and symptoms of potential                            delayed complications were discussed with the                            patient. Return to normal activities tomorrow.                            Written discharge instructions  were provided to the                            patient.                           - Resume previous diet.                           - Continue present medications.                           - Await pathology results.                           - Repeat colonoscopy for surveillance based on                            pathology results.                           - Return to GI office PRN. Gerrit Heck, MD 06/27/2021 2:01:52 PM

## 2021-06-27 NOTE — Progress Notes (Signed)
GASTROENTEROLOGY PROCEDURE H&P NOTE   Primary Care Physician: Wendie Agreste, MD    Reason for Procedure:  GERD, gastric intestinal metaplasia, colon polyp surveillance  Plan:    EGD with GIM mapping protocol, colonoscopy  Patient is appropriate for endoscopic procedure(s) in the ambulatory ([facility]) setting.  The nature of the procedure, as well as the risks, benefits, and alternatives were carefully and thoroughly reviewed with the patient. Ample time for discussion and questions allowed. The patient understood, was satisfied, and agreed to proceed.     HPI: Ian Salazar is a 61 y.o. male who presents for EGD for evaluation of GERD and gastric intestinal metaplasia on last upper endoscopy, along with colonoscopy for ongoing colon polyp surveillance.   Endoscopic History: -Colonoscopy (03/25/2015): 2 diminutive tubular adenomas -EGD (10/17/2019): Normal esophagus, mild gastritis with single linear erosion in the antrum (gastric intestinal metaplasia), mild peptic duodenitis  Past Medical History:  Diagnosis Date   Abnormal EKG    Normal ETT 5 years ago   Anemia    Family history of pancreatic cancer    GERD (gastroesophageal reflux disease)    occasional   HTN (hypertension)    Hx of adenomatous colonic polyps 03/28/2015   Hyperlipidemia    Knee pain, right    Rotator cuff tendonitis    Left   Type II or unspecified type diabetes mellitus without mention of complication, not stated as uncontrolled     Past Surgical History:  Procedure Laterality Date   COLONOSCOPY W/ POLYPECTOMY  03/25/2015   MULTIPLE TOOTH EXTRACTIONS     POLYPECTOMY     UPPER GASTROINTESTINAL ENDOSCOPY  10/17/2019    Prior to Admission medications   Medication Sig Start Date End Date Taking? Authorizing Provider  atorvastatin (LIPITOR) 20 MG tablet Take 1 tablet (20 mg total) by mouth daily. 05/29/21  Yes Wendie Agreste, MD  blood glucose meter kit and supplies KIT Dispense based on  patient and insurance preference. Check your fasting blood sugars daily. (FOR ICD-9 250.00, 250.01). 12/12/16  Yes Jaynee Eagles, PA-C  glucose blood (ONE TOUCH ULTRA TEST) test strip USE TO TEST FASTING BLOOD SUGARS DAILY 12/04/20  Yes Wendie Agreste, MD  glucose blood test strip Use as instructed 12/09/20  Yes Wendie Agreste, MD  lisinopril (ZESTRIL) 10 MG tablet Take 1 tablet (10 mg total) by mouth daily. 05/29/21  Yes Wendie Agreste, MD  metFORMIN (GLUCOPHAGE) 1000 MG tablet TAKE 1 TABLET(1000 MG) BY MOUTH TWICE DAILY WITH A MEAL 05/29/21  Yes Wendie Agreste, MD  omeprazole (PRILOSEC) 20 MG capsule TAKE 1 CAPSULE(20 MG) BY MOUTH DAILY 05/12/21  Yes Sahily Biddle V, DO  ONETOUCH DELICA LANCETS 79T MISC USE TO TEST FASTING BLOOD SUGARS DAILY 02/23/17   Jaynee Eagles, PA-C    Current Outpatient Medications  Medication Sig Dispense Refill   atorvastatin (LIPITOR) 20 MG tablet Take 1 tablet (20 mg total) by mouth daily. 90 tablet 2   blood glucose meter kit and supplies KIT Dispense based on patient and insurance preference. Check your fasting blood sugars daily. (FOR ICD-9 250.00, 250.01). 1 each 0   glucose blood (ONE TOUCH ULTRA TEST) test strip USE TO TEST FASTING BLOOD SUGARS DAILY 100 each 11   glucose blood test strip Use as instructed 100 each 12   lisinopril (ZESTRIL) 10 MG tablet Take 1 tablet (10 mg total) by mouth daily. 90 tablet 2   metFORMIN (GLUCOPHAGE) 1000 MG tablet TAKE 1 TABLET(1000 MG) BY MOUTH  TWICE DAILY WITH A MEAL 180 tablet 2   omeprazole (PRILOSEC) 20 MG capsule TAKE 1 CAPSULE(20 MG) BY MOUTH DAILY 90 capsule 2   ONETOUCH DELICA LANCETS 19J MISC USE TO TEST FASTING BLOOD SUGARS DAILY 100 each 11   Current Facility-Administered Medications  Medication Dose Route Frequency Provider Last Rate Last Admin   0.9 %  sodium chloride infusion  500 mL Intravenous Once Javohn Basey V, DO        Allergies as of 06/27/2021   (No Known Allergies)    Family History   Problem Relation Age of Onset   Hypertension Mother    Lung cancer Mother 58       smoker   Diabetes Father    Dementia Father    Heart disease Sister    Diabetes Sister    Diabetes Sister    Diabetes Brother    Pancreatic cancer Brother 87   Leukemia Maternal Aunt    Diabetes Maternal Grandmother    Diabetes Other        1st degree relative   Alcohol abuse Neg Hx    Stroke Neg Hx    Hyperlipidemia Neg Hx    Colon cancer Neg Hx    Colon polyps Neg Hx    Crohn's disease Neg Hx    Esophageal cancer Neg Hx    Rectal cancer Neg Hx    Stomach cancer Neg Hx     Social History   Socioeconomic History   Marital status: Married    Spouse name: Not on file   Number of children: 1   Years of education: Not on file   Highest education level: Not on file  Occupational History   Occupation: [employer]  Tobacco Use   Smoking status: Every Day    Packs/day: 0.50    Years: 13.00    Pack years: 6.50    Types: Cigarettes   Smokeless tobacco: Never  Vaping Use   Vaping Use: Never used  Substance and Sexual Activity   Alcohol use: Not Currently    Comment: 09/07/19-none in 2 1/2 years   Drug use: No   Sexual activity: Yes  Other Topics Concern   Not on file  Social History Narrative   Married.  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    Education: [employer].   Cigarette smoker, no drug use no alcohol since [age]-19      Social Determinants of Health   Financial Resource Strain: Not on file  Food Insecurity: Not on file  Transportation Needs: Not on file  Physical Activity: Not on file  Stress: Not on file  Social Connections: Not on file  Intimate Partner Violence: Not on file    Physical Exam: Vital signs in last 24 hours: '@BP'  110/70   Pulse 74   Temp 98.4 F (36.9 C)   Resp 18   Ht 5' 9.5" (1.765 m)   Wt 213 lb (96.6 kg)    SpO2 100%   BMI 31.00 kg/m  GEN: NAD EYE: Sclerae anicteric ENT: MMM CV: Non-tachycardic Pulm: CTA b/l GI: Soft, NT/ND NEURO:  Alert & Oriented x 3   Gerrit Heck, DO [facility]   06/27/2021 1:14 PM

## 2021-06-27 NOTE — Progress Notes (Signed)
Report given to PACU, vss 

## 2021-06-27 NOTE — Progress Notes (Signed)
Called to room to assist during endoscopic procedure.  Patient ID and intended procedure confirmed with present staff. Received instructions for my participation in the procedure from the performing physician.  

## 2021-07-01 ENCOUNTER — Telehealth: Payer: Self-pay | Admitting: *Deleted

## 2021-07-01 NOTE — Telephone Encounter (Signed)
  Follow up Call-     06/27/2021   12:44 PM 10/17/2019    3:14 PM  Call back number  Post procedure Call Back phone  # [phone]  Permission to leave phone message Yes Yes     Patient questions: Message left that we will call back after lunch.

## 2021-07-01 NOTE — Telephone Encounter (Signed)
  Follow up Call-     06/27/2021   12:44 PM 10/17/2019    3:14 PM  Call back number  Post procedure Call Back phone  # [phone]  Permission to leave phone message Yes Yes     Patient questions:  Do you have a fever, pain , or abdominal swelling? No. Pain Score  0 *  Have you tolerated food without any problems? Yes.    Have you been able to return to your normal activities? Yes.    Do you have any questions about your discharge instructions: Diet   No. Medications  No. Follow up visit  No.  Do you have questions or concerns about your Care? No.  Actions: * If pain score is 4 or above: No action needed, pain <4.

## 2021-07-09 ENCOUNTER — Encounter: Payer: Self-pay | Admitting: Gastroenterology

## 2021-08-28 ENCOUNTER — Ambulatory Visit: Payer: PRIVATE HEALTH INSURANCE | Admitting: Family Medicine

## 2022-02-12 ENCOUNTER — Other Ambulatory Visit: Payer: Self-pay | Admitting: Gastroenterology

## 2022-02-26 ENCOUNTER — Other Ambulatory Visit: Payer: Self-pay | Admitting: Family Medicine

## 2022-02-26 DIAGNOSIS — E119 Type 2 diabetes mellitus without complications: Secondary | ICD-10-CM

## 2022-02-26 DIAGNOSIS — I10 Essential (primary) hypertension: Secondary | ICD-10-CM

## 2022-02-26 DIAGNOSIS — E782 Mixed hyperlipidemia: Secondary | ICD-10-CM

## 2022-05-06 ENCOUNTER — Ambulatory Visit (INDEPENDENT_AMBULATORY_CARE_PROVIDER_SITE_OTHER): Payer: PRIVATE HEALTH INSURANCE | Admitting: Family Medicine

## 2022-05-06 ENCOUNTER — Encounter: Payer: Self-pay | Admitting: Family Medicine

## 2022-05-06 VITALS — BP 118/60 | HR 84 | Temp 98.2°F | Ht 69.0 in | Wt 214.2 lb

## 2022-05-06 DIAGNOSIS — E782 Mixed hyperlipidemia: Secondary | ICD-10-CM | POA: Diagnosis not present

## 2022-05-06 DIAGNOSIS — E1165 Type 2 diabetes mellitus with hyperglycemia: Secondary | ICD-10-CM | POA: Diagnosis not present

## 2022-05-06 DIAGNOSIS — I10 Essential (primary) hypertension: Secondary | ICD-10-CM

## 2022-05-06 MED ORDER — LISINOPRIL 10 MG PO TABS: ORAL_TABLET | ORAL | 2 refills | Status: DC

## 2022-05-06 MED ORDER — METFORMIN HCL 1000 MG PO TABS: ORAL_TABLET | ORAL | 2 refills | Status: DC

## 2022-05-06 MED ORDER — ATORVASTATIN CALCIUM 20 MG PO TABS: ORAL_TABLET | ORAL | 2 refills | Status: DC

## 2022-05-06 NOTE — Patient Instructions (Addendum)
Please have labs in next week: [facility] or xray: Walk in 8:30-4:30 during weekdays, no appointment needed [address]  [place], [place] [address]  We may need to add a medicine if elevated diabetes test. I will let you know. No other med changes for now.

## 2022-05-06 NOTE — Progress Notes (Signed)
Subjective:  Patient ID: Ian Salazar, male    DOB: November 05, 1960  Age: 62 y.o. MRN: [id number]  CC:  Chief Complaint  Patient presents with   Medical Management of Chronic Issues    Med check and labs     HPI [name] presents for   History Follow-up chronic conditions.  Last visit with me in April 2023, but has been under the care of of provider through [facility].     Diabetes: with hyperglycemia, previously treated with metformin 1000 mg twice daily with inhibitor ACE and statin as well.  He had lost some weight with diet adjustments in his April 2023 visit.  Continue same medications that time with continued work on diet and exercise with 69-month recheck planned. Labs reviewed from care everywhere.  A1c 7.5 on 06/04/2021.CBC normal 04/09/2022, glucose 178, otherwise reassuring CMP and normal lipid panel on 04/09/2022.  LDL 47. Diet about the same.  Home readings: up to 213 few days ago after large meal.  Usually 120's random.  Off metformin past few weeks.  No med side effects.  Microalbumin: due.  Optho, foot exam, pneumovax:  Optho appt in 1 week- will have note sent.   No FH of MEN syndrome, or hx of pancreatitis. Would prefer SGLT2 if other med needed.  Wt Readings from Last 3 Encounters:  05/06/22 214 lb 3.2 oz (97.2 kg)  06/27/21 213 lb (96.6 kg)  06/06/21 213 lb (96.6 kg)   Lab Results  Component Value Date   HGBA1C 7.5 (H) 05/29/2021   HGBA1C 7.1 (A) 02/27/2021   HGBA1C 7.5 (H) 11/25/2020   Lab Results  Component Value Date   MICROALBUR <0.7 11/25/2020   LDLCALC 50 05/29/2021   CREATININE 0.80 05/29/2021   Hypertension: Lisinopril 10mg  qd. No side effects.  Home readings:none BP Readings from Last 3 Encounters:  05/06/22 118/60  06/27/21 (!) 99/51  05/29/21 (!) 114/58   Lab Results  Component Value Date   CREATININE 0.80 05/29/2021   Hyperlipidemia: Lipitor 20g qd. Last labs as above at [facility].  Lab Results  Component  Value Date   CHOL 112 05/29/2021   HDL 53.80 05/29/2021   LDLCALC 50 05/29/2021   TRIG 43.0 05/29/2021   CHOLHDL 2 05/29/2021   Lab Results  Component Value Date   ALT 14 05/29/2021   AST 16 05/29/2021   ALKPHOS 61 05/29/2021   BILITOT 0.4 05/29/2021        Patient Active Problem List   Diagnosis Date Noted   Family history of pancreatic cancer 12/09/2020   Uncontrolled type 2 diabetes mellitus with insulin therapy 04/22/2017   Hx of adenomatous colonic polyps 03/28/2015   Routine general medical examination at a health care facility 07/17/2011   Mixed hyperlipidemia 07/17/2011   Abnormal chest x-ray 07/17/2011   Vitamin B 12 deficiency 04/22/2010   ANEMIA-NOS 04/18/2010   TOBACCO USE 04/18/2010   DEGENERATIVE JOINT DISEASE, KNEE 0000000   DM w/o complication type II 99991111   Past Medical History:  Diagnosis Date   Abnormal EKG    Normal ETT 5 years ago   Anemia    Family history of pancreatic cancer    GERD (gastroesophageal reflux disease)    occasional   HTN (hypertension)    Hx of adenomatous colonic polyps 03/28/2015   Hyperlipidemia    Knee pain, right    Rotator cuff tendonitis    Left   Type II or unspecified type diabetes mellitus without mention  of complication, not stated as uncontrolled    Past Surgical History:  Procedure Laterality Date   COLONOSCOPY W/ POLYPECTOMY  03/25/2015   MULTIPLE TOOTH EXTRACTIONS     POLYPECTOMY     UPPER GASTROINTESTINAL ENDOSCOPY  10/17/2019   No Known Allergies Prior to Admission medications   Medication Sig Start Date End Date Taking? Authorizing Provider  atorvastatin (LIPITOR) 20 MG tablet TAKE 1 TABLET(20 MG) BY MOUTH DAILY 02/26/22  Yes Wendie Agreste, MD  blood glucose meter kit and supplies KIT Dispense based on patient and insurance preference. Check your fasting blood sugars daily. (FOR ICD-9 250.00, 250.01). 12/12/16  Yes Jaynee Eagles, PA-C  glucose blood (ONETOUCH ULTRA) test strip USE TO TEST  FASTING BLOOD SUGARS DAILY 02/26/22  Yes Wendie Agreste, MD  lisinopril (ZESTRIL) 10 MG tablet TAKE 1 TABLET(10 MG) BY MOUTH DAILY 02/26/22  Yes Wendie Agreste, MD  metFORMIN (GLUCOPHAGE) 1000 MG tablet TAKE 1 TABLET(1000 MG) BY MOUTH TWICE DAILY WITH A MEAL 05/29/21  Yes Wendie Agreste, MD  omeprazole (PRILOSEC) 20 MG capsule TAKE 1 CAPSULE(20 MG) BY MOUTH DAILY 02/12/22  Yes Cirigliano, Vito V, DO  ONETOUCH DELICA LANCETS 99991111 MISC USE TO TEST FASTING BLOOD SUGARS DAILY 02/23/17  Yes Jaynee Eagles, PA-C  glucose blood test strip Use as instructed 12/09/20   Wendie Agreste, MD   Social History   Socioeconomic History   Marital status: Married    Spouse name: Not on file   Number of children: 1   Years of education: Not on file   Highest education level: 12th grade  Occupational History   Occupation: [employer]  Tobacco Use   Smoking status: Every Day    Packs/day: 0.50    Years: 13.00    Additional pack years: 0.00    Total pack years: 6.50    Types: Cigarettes   Smokeless tobacco: Never  Vaping Use   Vaping Use: Never used  Substance and Sexual Activity   Alcohol use: Not Currently    Comment: 09/07/19-none in 2 1/2 years   Drug use: No   Sexual activity: Yes  Other Topics Concern   Not on file  Social History Narrative   Married.  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    Education: [employer].   Cigarette smoker, no drug use no alcohol since [age]-19      Social Determinants of Health   Financial Resource Strain: Not on file  Food Insecurity: No Food Insecurity (05/03/2022)   Hunger Vital Sign    Worried About Running Out of Food in the Last Year: Never true    Ran Out of Food in the Last Year: Never true  Transportation Needs: No Transportation Needs (05/03/2022)   PRAPARE - Hydrologist (Medical): No     Lack of Transportation (Non-Medical): No  Physical Activity: Insufficiently Active (05/03/2022)   Exercise Vital Sign    Days of Exercise per Week: 1 day    Minutes of Exercise per Session: 60 min  Stress: No Stress Concern Present (05/03/2022)   [place]    Feeling of Stress : Not at all  Social Connections: Unknown (05/03/2022)   Social Connection and Isolation Panel [NHANES]    Frequency of Communication with Friends and  Family: Not on file    Frequency of Social Gatherings with Friends and Family: Once a week    Attends Religious Services: More than 4 times per year    Active Member of [employer] or Organizations: Yes    Attends Music therapist: More than 4 times per year    Marital Status: Married  Human resources officer Violence: Not on file    Review of Systems  Constitutional:  Negative for fatigue and unexpected weight change.  Eyes:  Negative for visual disturbance.  Respiratory:  Negative for cough, chest tightness and shortness of breath.   Cardiovascular:  Negative for chest pain, palpitations and leg swelling.  Gastrointestinal:  Negative for abdominal pain and blood in stool.  Neurological:  Negative for dizziness, light-headedness and headaches.     Objective:   Vitals:   05/06/22 1547  BP: 118/60  Pulse: 84  Temp: 98.2 F (36.8 C)  TempSrc: Temporal  SpO2: 98%  Weight: 214 lb 3.2 oz (97.2 kg)  Height: 5\' 9"  (1.753 m)     Physical Exam Vitals reviewed.  Constitutional:      Appearance: He is well-developed.  HENT:     Head: Normocephalic and atraumatic.  Neck:     Vascular: No carotid bruit or JVD.  Cardiovascular:     Rate and Rhythm: Normal rate and regular rhythm.     Heart sounds: Normal heart sounds. No murmur heard. Pulmonary:     Effort: Pulmonary effort is normal.     Breath sounds: Normal breath sounds. No rales.  Musculoskeletal:     Right lower leg: No edema.      Left lower leg: No edema.  Skin:    General: Skin is warm and dry.  Neurological:     Mental Status: He is alert and oriented to person, place, and time.  Psychiatric:        Mood and Affect: Mood normal.        Assessment & Plan:  YANSEL BENTZ is a 62 y.o. male . Type 2 diabetes mellitus with hyperglycemia, without long-term current use of insulin - Plan: Hemoglobin A1c, Microalbumin / creatinine urine ratio  - Stable, tolerating current regimen. Medications refilled. Labs pending as above.  Likely will add SGLT2 if persistent elevated A1c.  Potential side effects discussed.  Declined injections/GLP.  Diet/exercise discussed.  52-month follow-up.  Essential hypertension - Plan: metFORMIN (GLUCOPHAGE) 1000 MG tablet, lisinopril (ZESTRIL) 10 MG tablet  -Stable on current regimen, continue lisinopril, recent labs noted through [facility]  Mixed hyperlipidemia - Plan: atorvastatin (LIPITOR) 20 MG tablet  -Recent labs noted as above, tolerating Lipitor, continue same.  Meds ordered this encounter  Medications   metFORMIN (GLUCOPHAGE) 1000 MG tablet    Sig: TAKE 1 TABLET(1000 MG) BY MOUTH TWICE DAILY WITH A MEAL    Dispense:  180 tablet    Refill:  2   atorvastatin (LIPITOR) 20 MG tablet    Sig: TAKE 1 TABLET(20 MG) BY MOUTH DAILY    Dispense:  90 tablet    Refill:  2   lisinopril (ZESTRIL) 10 MG tablet    Sig: TAKE 1 TABLET(10 MG) BY MOUTH DAILY    Dispense:  90 tablet    Refill:  2   Patient Instructions  Please have labs in next week: [facility] or xray: Walk in 8:30-4:30 during weekdays, no appointment needed [address].  [place], [place] [address]  We may need to add a medicine if elevated diabetes  test. I will let you know. No other med changes for now.       Signed,   Merri Ray, MD [facility] 05/06/22 4:17 PM

## 2022-05-13 ENCOUNTER — Other Ambulatory Visit (INDEPENDENT_AMBULATORY_CARE_PROVIDER_SITE_OTHER): Payer: PRIVATE HEALTH INSURANCE

## 2022-05-13 DIAGNOSIS — E1165 Type 2 diabetes mellitus with hyperglycemia: Secondary | ICD-10-CM

## 2022-05-13 LAB — MICROALBUMIN / CREATININE URINE RATIO
Creatinine,U: 34.8 mg/dL
Microalb Creat Ratio: 2 mg/g (ref 0.0–30.0)
Microalb, Ur: <0.7 mg/dL (ref 0.0–1.9)

## 2022-05-13 LAB — HEMOGLOBIN A1C: Hgb A1c MFr Bld: 8.9 % — ABNORMAL HIGH (ref 4.6–6.5)

## 2022-05-13 LAB — HM DIABETES EYE EXAM

## 2022-05-25 ENCOUNTER — Other Ambulatory Visit: Payer: Self-pay

## 2022-05-25 ENCOUNTER — Other Ambulatory Visit: Payer: Self-pay | Admitting: Family Medicine

## 2022-05-25 DIAGNOSIS — E119 Type 2 diabetes mellitus without complications: Secondary | ICD-10-CM

## 2022-05-25 MED ORDER — ONETOUCH ULTRA VI STRP
ORAL_STRIP | 0 refills | Status: AC
Start: 2022-05-25 — End: ?

## 2022-08-05 ENCOUNTER — Ambulatory Visit: Payer: PRIVATE HEALTH INSURANCE | Admitting: Family Medicine

## 2023-01-26 ENCOUNTER — Telehealth: Payer: Self-pay | Admitting: Family Medicine

## 2023-01-26 NOTE — Telephone Encounter (Signed)
Pt has been contacted, insurance information has been updated and verified

## 2023-01-26 NOTE — Telephone Encounter (Signed)
Copied from CRM [id number]. Topic: General - Registration Update >> Jan 26, 2023  9:13 AM Fredrich Romans wrote: Patient/patient representative is calling to make an update to registration. Patient wanted to verify that we have correct insurance on file

## 2023-01-28 ENCOUNTER — Encounter: Payer: Self-pay | Admitting: Family Medicine

## 2023-01-28 ENCOUNTER — Ambulatory Visit: Payer: PRIVATE HEALTH INSURANCE | Admitting: Family Medicine

## 2023-01-28 VITALS — BP 122/68 | HR 66 | Temp 98.8°F | Ht 69.0 in | Wt 216.4 lb

## 2023-01-28 DIAGNOSIS — E1165 Type 2 diabetes mellitus with hyperglycemia: Secondary | ICD-10-CM | POA: Diagnosis not present

## 2023-01-28 DIAGNOSIS — D649 Anemia, unspecified: Secondary | ICD-10-CM

## 2023-01-28 DIAGNOSIS — I10 Essential (primary) hypertension: Secondary | ICD-10-CM | POA: Diagnosis not present

## 2023-01-28 DIAGNOSIS — F17210 Nicotine dependence, cigarettes, uncomplicated: Secondary | ICD-10-CM | POA: Diagnosis not present

## 2023-01-28 DIAGNOSIS — Z23 Encounter for immunization: Secondary | ICD-10-CM

## 2023-01-28 LAB — CBC
HCT: 39.3 % (ref 39.0–52.0)
Hemoglobin: 12.8 g/dL — ABNORMAL LOW (ref 13.0–17.0)
MCHC: 32.6 g/dL (ref 30.0–36.0)
MCV: 83.5 fL (ref 78.0–100.0)
Platelets: 145 10*3/uL — ABNORMAL LOW (ref 150.0–400.0)
RBC: 4.71 Mil/uL (ref 4.22–5.81)
RDW: 14.9 % (ref 11.5–15.5)
WBC: 4.1 10*3/uL (ref 4.0–10.5)

## 2023-01-28 NOTE — Patient Instructions (Addendum)
We can stay on the same medications for now, but if you can make some diet changes, exercise changes that may be sufficient to get the diabetes to better control.  A1C goal of under 7. We will recheck your blood sugar average in 3 months and if still elevated at that time we will need to add a medication.   No other med changes at this time. Great work on quitting smoking! See info below of needed. I will also refer you for lung cancer screening.   Take care!  Managing the Challenge of Quitting Smoking Quitting smoking is a physical and mental challenge. You may have cravings, withdrawal symptoms, and temptation to smoke. Before quitting, work with your health care provider to make a plan that can help you manage quitting. Making a plan before you quit may keep you from smoking when you have the urge to smoke while trying to quit. How to manage lifestyle changes Managing stress Stress can make you want to smoke, and wanting to smoke may cause stress. It is important to find ways to manage your stress. You could try some of the following: Practice relaxation techniques. Breathe slowly and deeply, in through your nose and out through your mouth. Listen to music. Soak in a bath or take a shower. Imagine a peaceful place or vacation. Get some support. Talk with family or friends about your stress. Join a support group. Talk with a counselor or therapist. Get some physical activity. Go for a walk, run, or bike ride. Play a favorite sport. Practice yoga.  Medicines Talk with your health care provider about medicines that might help you deal with cravings and make quitting easier for you. Relationships Social situations can be difficult when you are quitting smoking. To manage this, you can: Avoid parties and other social situations where people might be smoking. Avoid alcohol. Leave right away if you have the urge to smoke. Explain to your family and friends that you are quitting smoking.  Ask for support and let them know you might be a bit grumpy. Plan activities where smoking is not an option. General instructions Be aware that many people gain weight after they quit smoking. However, not everyone does. To keep from gaining weight, have a plan in place before you quit, and stick to the plan after you quit. Your plan should include: Eating healthy snacks. When you have a craving, it may help to: Eat popcorn, or try carrots, celery, or other cut vegetables. Chew sugar-free gum. Changing how you eat. Eat small portion sizes at meals. Eat 4-6 small meals throughout the day instead of 1-2 large meals a day. Be mindful when you eat. You should avoid watching television or doing other things that might distract you as you eat. Exercising regularly. Make time to exercise each day. If you do not have time for a long workout, do short bouts of exercise for 5-10 minutes several times a day. Do some form of strengthening exercise, such as weight lifting. Do some exercise that gets your heart beating and causes you to breathe deeply, such as walking fast, running, swimming, or biking. This is very important. Drinking plenty of water or other low-calorie or no-calorie drinks. Drink enough fluid to keep your urine pale yellow.  How to recognize withdrawal symptoms Your body and mind may experience discomfort as you try to get used to not having nicotine in your system. These effects are called withdrawal symptoms. They may include: Feeling hungrier than normal. Having trouble  concentrating. Feeling irritable or restless. Having trouble sleeping. Feeling depressed. Craving a cigarette. These symptoms may surprise you, but they are normal to have when quitting smoking. To manage withdrawal symptoms: Avoid places, people, and activities that trigger your cravings. Remember why you want to quit. Get plenty of sleep. Avoid coffee and other drinks that contain caffeine. These may worsen  some of your symptoms. How to manage cravings Come up with a plan for how to deal with your cravings. The plan should include the following: A definition of the specific situation you want to deal with. An activity or action you will take to replace smoking. A clear idea for how this action will help. The name of someone who could help you with this. Cravings usually last for 5-10 minutes. Consider taking the following actions to help you with your plan to deal with cravings: Keep your mouth busy. Chew sugar-free gum. Suck on hard candies or a straw. Brush your teeth. Keep your hands and body busy. Change to a different activity right away. Squeeze or play with a ball. Do an activity or a hobby, such as making bead jewelry, practicing needlepoint, or working with wood. Mix up your normal routine. Take a short exercise break. Go for a quick walk, or run up and down stairs. Focus on doing something kind or helpful for someone else. Call a friend or family member to talk during a craving. Join a support group. Contact a quitline. Where to find support To get help or find a support group: Call the National Cancer Institute's Smoking Quitline: 1-800-QUIT-NOW 551-613-1767) Text QUIT to SmokefreeTXT: 272536 Where to find more information Visit these websites to find more information on quitting smoking: [place] Department of Health and Human Services: www.smokefree.gov American Lung Association: www.freedomfromsmoking.org Centers for Disease Control and Prevention (CDC): FootballExhibition.com.br American Heart Association: www.heart.org Contact a health care provider if: You want to change your plan for quitting. The medicines you are taking are not helping. Your eating feels out of control or you cannot sleep. You feel depressed or become very anxious. Summary Quitting smoking is a physical and mental challenge. You will face cravings, withdrawal symptoms, and temptation to smoke again. Preparation can  help you as you go through these challenges. Try different techniques to manage stress, handle social situations, and prevent weight gain. You can deal with cravings by keeping your mouth busy (such as by chewing gum), keeping your hands and body busy, calling family or friends, or contacting a quitline for people who want to quit smoking. You can deal with withdrawal symptoms by avoiding places where people smoke, getting plenty of rest, and avoiding drinks that contain caffeine. This information is not intended to replace advice given to you by your health care provider. Make sure you discuss any questions you have with your health care provider. Document Revised: 01/10/2021 Document Reviewed: 01/10/2021 Elsevier Patient Education  2024 [employer]  Diabetes Mellitus and Nutrition, Adult When you have diabetes, or diabetes mellitus, it is very important to have healthy eating habits because your blood sugar (glucose) levels are greatly affected by what you eat and drink. Eating healthy foods in the right amounts, at about the same times every day, can help you: Manage your blood glucose. Lower your risk of heart disease. Improve your blood pressure. Reach or maintain a healthy weight. What can affect my meal plan? Every person with diabetes is different, and each person has different needs for a meal plan. Your health care provider may  recommend that you work with a dietitian to make a meal plan that is best for you. Your meal plan may vary depending on factors such as: The calories you need. The medicines you take. Your weight. Your blood glucose, blood pressure, and cholesterol levels. Your activity level. Other health conditions you have, such as heart or kidney disease. How do carbohydrates affect me? Carbohydrates, also called carbs, affect your blood glucose level more than any other type of food. Eating carbs raises the amount of glucose in your blood. It is important to know how  many carbs you can safely have in each meal. This is different for every person. Your dietitian can help you calculate how many carbs you should have at each meal and for each snack. How does alcohol affect me? Alcohol can cause a decrease in blood glucose (hypoglycemia), especially if you use insulin or take certain diabetes medicines by mouth. Hypoglycemia can be a life-threatening condition. Symptoms of hypoglycemia, such as sleepiness, dizziness, and confusion, are similar to symptoms of having too much alcohol. Do not drink alcohol if: Your health care provider tells you not to drink. You are pregnant, may be pregnant, or are planning to become pregnant. If you drink alcohol: Limit how much you have to: 0-1 drink a day for women. 0-2 drinks a day for men. Know how much alcohol is in your drink. In the [place] one drink equals one 12 oz bottle of beer (355 mL), one 5 oz glass of wine (148 mL), or one 1 oz glass of hard liquor (44 mL). Keep yourself hydrated with water, diet soda, or unsweetened iced tea. Keep in mind that regular soda, juice, and other mixers may contain a lot of sugar and must be counted as carbs. What are tips for following this plan?  Reading food labels Start by checking the serving size on the Nutrition Facts label of packaged foods and drinks. The number of calories and the amount of carbs, fats, and other nutrients listed on the label are based on one serving of the item. Many items contain more than one serving per package. Check the total grams (g) of carbs in one serving. Check the number of grams of saturated fats and trans fats in one serving. Choose foods that have a low amount or none of these fats. Check the number of milligrams (mg) of salt (sodium) in one serving. Most people should limit total sodium intake to less than 2,300 mg per day. Always check the nutrition information of foods labeled as "low-fat" or "nonfat." These foods may be higher in added sugar  or refined carbs and should be avoided. Talk to your dietitian to identify your daily goals for nutrients listed on the label. Shopping Avoid buying canned, pre-made, or processed foods. These foods tend to be high in fat, sodium, and added sugar. Shop around the outside edge of the grocery store. This is where you will most often find fresh fruits and vegetables, bulk grains, fresh meats, and fresh dairy products. Cooking Use low-heat cooking methods, such as baking, instead of high-heat cooking methods, such as deep frying. Cook using healthy oils, such as olive, canola, or sunflower oil. Avoid cooking with butter, cream, or high-fat meats. Meal planning Eat meals and snacks regularly, preferably at the same times every day. Avoid going long periods of time without eating. Eat foods that are high in fiber, such as fresh fruits, vegetables, beans, and whole grains. Eat 4-6 oz (112-168 g) of lean protein each  day, such as lean meat, chicken, fish, eggs, or tofu. One ounce (oz) (28 g) of lean protein is equal to: 1 oz (28 g) of meat, chicken, or fish. 1 egg.  cup (62 g) of tofu. Eat some foods each day that contain healthy fats, such as avocado, nuts, seeds, and fish. What foods should I eat? Fruits Berries. Apples. Oranges. Peaches. Apricots. Plums. Grapes. Mangoes. Papayas. Pomegranates. Kiwi. Cherries. Vegetables Leafy greens, including lettuce, spinach, kale, chard, collard greens, mustard greens, and cabbage. Beets. Cauliflower. Broccoli. Carrots. Green beans. Tomatoes. Peppers. Onions. Cucumbers. Brussels sprouts. Grains Whole grains, such as whole-wheat or whole-grain bread, crackers, tortillas, cereal, and pasta. Unsweetened oatmeal. Quinoa. Brown or wild rice. Meats and other proteins Seafood. Poultry without skin. Lean cuts of poultry and beef. Tofu. Nuts. Seeds. Dairy Low-fat or fat-free dairy products such as milk, yogurt, and cheese. The items listed above may not be a  complete list of foods and beverages you can eat and drink. Contact a dietitian for more information. What foods should I avoid? Fruits Fruits canned with syrup. Vegetables Canned vegetables. Frozen vegetables with butter or cream sauce. Grains Refined white flour and flour products such as bread, pasta, snack foods, and cereals. Avoid all processed foods. Meats and other proteins Fatty cuts of meat. Poultry with skin. Breaded or fried meats. Processed meat. Avoid saturated fats. Dairy Full-fat yogurt, cheese, or milk. Beverages Sweetened drinks, such as soda or iced tea. The items listed above may not be a complete list of foods and beverages you should avoid. Contact a dietitian for more information. Questions to ask a health care provider Do I need to meet with a certified diabetes care and education specialist? Do I need to meet with a dietitian? What number can I call if I have questions? When are the best times to check my blood glucose? Where to find more information: American Diabetes Association: diabetes.org Academy of Nutrition and Dietetics: eatright.[employer] of Diabetes and Digestive and Kidney Diseases: StageSync.si Association of Diabetes Care & Education Specialists: diabeteseducator.org Summary It is important to have healthy eating habits because your blood sugar (glucose) levels are greatly affected by what you eat and drink. It is important to use alcohol carefully. A healthy meal plan will help you manage your blood glucose and lower your risk of heart disease. Your health care provider may recommend that you work with a dietitian to make a meal plan that is best for you. This information is not intended to replace advice given to you by your health care provider. Make sure you discuss any questions you have with your health care provider. Document Revised: 08/23/2019 Document Reviewed: 08/23/2019 Elsevier Patient Education  2024 [employer].

## 2023-01-28 NOTE — Progress Notes (Signed)
Subjective:  Patient ID: Ian Salazar, male    DOB: [birth year]  Age: 62 y.o. MRN: [id number]  CC:  Chief Complaint  Patient presents with   Medical Management of Chronic Issues    Pt is doing well, no concerns at this time     HPI DEMARR KANGAS presents for   Diabetes: With hyperglycemia, previously treated with metformin.  Had made some diet adjustments and weight loss last year.  Unfortunately A1c increased in April.  Had been off his metformin for a few weeks at that time. He was continued on ACE inhibitor and statin, advised to restart his metformin and monitor home readings.  If those were still elevated planned for additional medication but either way 32-month follow-up.  Last visit in April. No recent A1c, but [facility] has team that comes to his work yearly for wellness program. Labs noted form 01/12/23: A1c 7.7, lipid panel total 120, triglycerides 44, HDL 58, LDL 51.  CMP with glucose 160, otherwise normal.  CBC with hemoglobin 12.5, previously 13.3 in March. No melena/hematochezia. Colonoscopy in 2023.  No recent missed doses of 1000mg  BID metformin. No new side effects.  Feels like he could improve diet and exercise.  Home readings fasting: not checking.  Postprandial: 150-160 Symptomatic lows - none.   Microalbumin: Normal ratio 05/13/2022 Optho, foot exam, pneumovax: Up-to-date. Lab Results  Component Value Date   HGBA1C 8.9 (H) 05/13/2022   HGBA1C 7.5 (H) 05/29/2021   HGBA1C 7.1 (A) 02/27/2021   Lab Results  Component Value Date   MICROALBUR <0.7 05/13/2022   LDLCALC 50 05/29/2021   CREATININE 0.80 05/29/2021   Hypertension: Treated with lisinopril 10 mg daily without any side effects. Home readings: BP Readings from Last 3 Encounters:  01/28/23 122/68  05/06/22 118/60  06/27/21 (!) 99/51   Lab Results  Component Value Date   CREATININE 0.80 05/29/2021   Hyperlipidemia: Lipitor 20 mg daily. Lab Results  Component Value Date   CHOL 112  05/29/2021   HDL 53.80 05/29/2021   LDLCALC 50 05/29/2021   TRIG 43.0 05/29/2021   CHOLHDL 2 05/29/2021   Lab Results  Component Value Date   ALT 14 05/29/2021   AST 16 05/29/2021   ALKPHOS 61 05/29/2021   BILITOT 0.4 05/29/2021    Nicotine addiction: About 1/2ppd cigarettes since age 40 on average. Recommended - agrees to referral.  Quit smoking 3 days ago. [place].   History Patient Active Problem List   Diagnosis Date Noted   Family history of pancreatic cancer 12/09/2020   Uncontrolled type 2 diabetes mellitus with insulin therapy 04/22/2017   Hx of adenomatous colonic polyps 03/28/2015   Routine general medical examination at a health care facility 07/17/2011   Mixed hyperlipidemia 07/17/2011   Abnormal chest x-ray 07/17/2011   Vitamin B 12 deficiency 04/22/2010   ANEMIA-NOS 04/18/2010   TOBACCO USE 04/18/2010   DEGENERATIVE JOINT DISEASE, KNEE 10/24/2008   DM w/o complication type II 02/27/2008   Past Medical History:  Diagnosis Date   Abnormal EKG    Normal ETT 5 years ago   Anemia    Family history of pancreatic cancer    GERD (gastroesophageal reflux disease)    occasional   HTN (hypertension)    Hx of adenomatous colonic polyps 03/28/2015   Hyperlipidemia    Knee pain, right    Rotator cuff tendonitis    Left   Type II or unspecified type diabetes mellitus without mention of complication, not stated  as uncontrolled    Past Surgical History:  Procedure Laterality Date   COLONOSCOPY W/ POLYPECTOMY  03/25/2015   MULTIPLE TOOTH EXTRACTIONS     POLYPECTOMY     UPPER GASTROINTESTINAL ENDOSCOPY  10/17/2019   No Known Allergies Prior to Admission medications   Medication Sig Start Date End Date Taking? Authorizing Provider  atorvastatin (LIPITOR) 20 MG tablet TAKE 1 TABLET(20 MG) BY MOUTH DAILY 05/06/22  Yes Shade Flood, MD  blood glucose meter kit and supplies KIT Dispense based on patient and insurance preference. Check your fasting blood sugars  daily. (FOR ICD-9 250.00, 250.01). 12/12/16  Yes Wallis Bamberg, PA-C  glucose blood (ONETOUCH ULTRA) test strip USE TO TEST FASTING BLOOD SUGARS DAILY 05/25/22  Yes Shade Flood, MD  lisinopril (ZESTRIL) 10 MG tablet TAKE 1 TABLET(10 MG) BY MOUTH DAILY 05/06/22  Yes Shade Flood, MD  metFORMIN (GLUCOPHAGE) 1000 MG tablet TAKE 1 TABLET(1000 MG) BY MOUTH TWICE DAILY WITH A MEAL 05/06/22  Yes Shade Flood, MD  omeprazole (PRILOSEC) 20 MG capsule TAKE 1 CAPSULE(20 MG) BY MOUTH DAILY 02/12/22  Yes Cirigliano, Vito V, DO  ONETOUCH DELICA LANCETS 33G MISC USE TO TEST FASTING BLOOD SUGARS DAILY 02/23/17  Yes Wallis Bamberg, PA-C   Social History   Socioeconomic History   Marital status: Married    Spouse name: Not on file   Number of children: 1   Years of education: Not on file   Highest education level: 12th grade  Occupational History   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    Education: [employer].   Cigarette smoker, no drug use no alcohol since [age]      Social Drivers of Corporate investment banker Strain: Not on file  Food Insecurity: No Food Insecurity (05/03/2022)   Hunger Vital Sign    Worried About Running Out of Food in the Last Year: Never true    Ran Out of Food in the Last Year: Never true  Transportation Needs: No Transportation Needs (05/03/2022)   PRAPARE - Therapist, art (Medical): No    Lack of Transportation (Non-Medical): No  Physical Activity: Insufficiently Active (05/03/2022)   Exercise Vital Sign    Days of Exercise per Week: 1 day    Minutes of Exercise per Session: 60 min  Stress: No Stress Concern Present (05/03/2022)   [employer] of Occupational Health - Occupational Stress Questionnaire    Feeling of Stress : Not at all  Social Connections: Unknown (05/03/2022)   Social Connection and Isolation Panel [NHANES]    Frequency of Communication with Friends and Family: Not on file    Frequency of Social Gatherings with Friends and Family: Once a week    Attends Religious Services:  More than 4 times per year    Active Member of Clubs or Organizations: Yes    Attends Banker Meetings: More than 4 times per year    Marital Status: Married  Intimate Partner Violence: Unknown (05/27/2021)   Received from [facility]   HITS    Physically Hurt: Not on file    Insult or Talk Down To: Not on file    Threaten Physical Harm: Not on file    Scream or Curse: Not on file    Review of Systems  Constitutional:  Negative for fatigue and unexpected weight change.  Eyes:  Negative for visual disturbance.  Respiratory:  Negative for cough, chest tightness and shortness of breath.   Cardiovascular:  Negative for chest pain, palpitations and leg swelling.  Gastrointestinal:  Negative for abdominal pain and blood in stool.  Neurological:  Negative for dizziness, light-headedness and headaches.     Objective:   Vitals:   01/28/23 1042  BP: 122/68  Pulse: 66  Temp: 98.8 F (37.1 C)  TempSrc: Temporal  SpO2: 100%  Weight: 216 lb 6.4 oz (98.2 kg)  Height: 5\' 9"  (1.753 m)     Physical Exam Vitals reviewed.  Constitutional:      Appearance: He is well-developed.  HENT:     Head: Normocephalic and atraumatic.  Neck:     Vascular: No carotid bruit or JVD.  Cardiovascular:     Rate and Rhythm: Normal rate  and regular rhythm.     Heart sounds: Normal heart sounds. No murmur heard. Pulmonary:     Effort: Pulmonary effort is normal.     Breath sounds: Normal breath sounds. No rales.  Musculoskeletal:     Right lower leg: No edema.     Left lower leg: No edema.  Skin:    General: Skin is warm and dry.  Neurological:     Mental Status: He is alert and oriented to person, place, and time.  Psychiatric:        Mood and Affect: Mood normal.        Assessment & Plan:  JAXSEN NAFTZGER is a 62 y.o. male . Low hemoglobin - Plan: CBC  -Slightly low hemoglobin on outside labs, repeat CBC.  Asymptomatic  Essential hypertension  -Stable on current med regimen, no changes.  Recent labs reviewed as above.  Type 2 diabetes mellitus with hyperglycemia, without long-term current use of insulin (HCC)  -Option of additional medications for improved control but he feels like he can make some dietary, exercise changes.  Will hold on current regimen, 86-month recheck.  If still elevated at that time, consider addition of SGLT2 or GLP.Marland Kitchen  Cigarette nicotine dependence without complication - Plan: Ambulatory Referral Lung Cancer Screening [facility]  -Commended on recent smoking cessation.  Referral for lung cancer screening evaluation.  Needs flu shot - Plan: Flu vaccine trivalent PF, 6mos and older(Flulaval,Afluria,Fluarix,Fluzone)   No orders of the defined types were placed in this encounter.  Patient Instructions  We can stay on the same medications for now, but if you can make some diet changes, exercise changes that may be sufficient to get the diabetes to better control.  A1C goal of under 7. We will recheck your blood sugar average in 3 months and if still elevated at that time we will need to add a medication.   No other med changes at this time. Great work on quitting smoking! See info below of needed. I will also refer you  for lung cancer screening.   Take care!  Managing the  Challenge of Quitting Smoking Quitting smoking is a physical and mental challenge. You may have cravings, withdrawal symptoms, and temptation to smoke. Before quitting, work with your health care provider to make a plan that can help you manage quitting. Making a plan before you quit may keep you from smoking when you have the urge to smoke while trying to quit. How to manage lifestyle changes Managing stress Stress can make you want to smoke, and wanting to smoke may cause stress. It is important to find ways to manage your stress. You could try some of the following: Practice relaxation techniques. Breathe slowly and deeply, in through your nose and out through your mouth. Listen to music. Soak in a bath or take a shower. Imagine a peaceful place or vacation. Get some support. Talk with family or friends about your stress. Join a support group. Talk with a counselor or therapist. Get some physical activity. Go for a walk, run, or bike ride. Play a favorite sport. Practice yoga.  Medicines Talk with your health care provider about medicines that might help you deal with cravings and make quitting easier for you. Relationships Social situations can be difficult when you are quitting smoking. To manage this, you can: Avoid parties and other social situations where people might be smoking. Avoid alcohol. Leave right away if you have the urge to smoke. Explain to your family and friends that you are quitting smoking. Ask for support and let them know you might be a bit grumpy. Plan activities where smoking is not an option. General instructions Be aware that many people gain weight after they quit smoking. However, not everyone does. To keep from gaining weight, have a plan in place before you quit, and stick to the plan after you quit. Your plan should include: Eating healthy snacks. When you have a craving, it may help to: Eat popcorn, or try carrots, celery, or other cut  vegetables. Chew sugar-free gum. Changing how you eat. Eat small portion sizes at meals. Eat 4-6 small meals throughout the day instead of 1-2 large meals a day. Be mindful when you eat. You should avoid watching television or doing other things that might distract you as you eat. Exercising regularly. Make time to exercise each day. If you do not have time for a long workout, do short bouts of exercise for 5-10 minutes several times a day. Do some form of strengthening exercise, such as weight lifting. Do some exercise that gets your heart beating and causes you to breathe deeply, such as walking fast, running, swimming, or biking. This is very important. Drinking plenty of water or other low-calorie or no-calorie drinks. Drink enough fluid to keep your urine pale yellow.  How to recognize withdrawal symptoms Your body and mind may experience discomfort as you try to get used to not having nicotine in your system. These effects are called withdrawal symptoms. They may include: Feeling hungrier than normal. Having trouble concentrating. Feeling irritable or restless. Having trouble sleeping. Feeling depressed. Craving a cigarette. These symptoms may surprise you, but they are normal to have when quitting smoking. To manage withdrawal symptoms: Avoid places, people, and activities that trigger your cravings. Remember why you want to quit. Get plenty of sleep. Avoid coffee and other drinks that contain caffeine. These may worsen some of your symptoms. How to manage cravings Come up with a plan for how to deal with your cravings. The plan  should include the following: A definition of the specific situation you want to deal with. An activity or action you will take to replace smoking. A clear idea for how this action will help. The name of someone who could help you with this. Cravings usually last for 5-10 minutes. Consider taking the following actions to help you with your plan to deal  with cravings: Keep your mouth busy. Chew sugar-free gum. Suck on hard candies or a straw. Brush your teeth. Keep your hands and body busy. Change to a different activity right away. Squeeze or play with a ball. Do an activity or a hobby, such as making bead jewelry, practicing needlepoint, or working with wood. Mix up your normal routine. Take a short exercise break. Go for a quick walk, or run up and down stairs. Focus on doing something kind or helpful for someone else. Call a friend or family member to talk during a craving. Join a support group. Contact a quitline. Where to find support To get help or find a support group: Call the National Cancer Institute's Smoking Quitline: 1-800-QUIT-NOW 831-490-6733) Text QUIT to SmokefreeTXT: 366440 Where to find more information Visit these websites to find more information on quitting smoking: U.S. Department of Health and Human Services: www.smokefree.gov American Lung Association: www.freedomfromsmoking.org Centers for Disease Control and Prevention (CDC): FootballExhibition.com.br American Heart Association: www.heart.org Contact a health care provider if: You want to change your plan for quitting. The medicines you are taking are not helping. Your eating feels out of control or you cannot sleep. You feel depressed or become very anxious. Summary Quitting smoking is a physical and mental challenge. You will face cravings, withdrawal symptoms, and temptation to smoke again. Preparation can help you as you go through these challenges. Try different techniques to manage stress, handle social situations, and prevent weight gain. You can deal with cravings by keeping your mouth busy (such as by chewing gum), keeping your hands and body busy, calling family or friends, or contacting a quitline for people who want to quit smoking. You can deal with withdrawal symptoms by avoiding places where people smoke, getting plenty of rest, and avoiding drinks that  contain caffeine. This information is not intended to replace advice given to you by your health care provider. Make sure you discuss any questions you have with your health care provider. Document Revised: 01/10/2021 Document Reviewed: 01/10/2021 Elsevier Patient Education  2024 Elsevier Inc.  Diabetes Mellitus and Nutrition, Adult When you have diabetes, or diabetes mellitus, it is very important to have healthy eating habits because your blood sugar (glucose) levels are greatly affected by what you eat and drink. Eating healthy foods in the right amounts, at about the same times every day, can help you: Manage your blood glucose. Lower your risk of heart disease. Improve your blood pressure. Reach or maintain a healthy weight. What can affect my meal plan? Every person with diabetes is different, and each person has different needs for a meal plan. Your health care provider may recommend that you work with a dietitian to make a meal plan that is best for you. Your meal plan may vary depending on factors such as: The calories you need. The medicines you take. Your weight. Your blood glucose, blood pressure, and cholesterol levels. Your activity level. Other health conditions you have, such as heart or kidney disease. How do carbohydrates affect me? Carbohydrates, also called carbs, affect your blood glucose level more than any other type of food. Eating carbs raises  the amount of glucose in your blood. It is important to know how many carbs you can safely have in each meal. This is different for every person. Your dietitian can help you calculate how many carbs you should have at each meal and for each snack. How does alcohol affect me? Alcohol can cause a decrease in blood glucose (hypoglycemia), especially if you use insulin or take certain diabetes medicines by mouth. Hypoglycemia can be a life-threatening condition. Symptoms of hypoglycemia, such as sleepiness, dizziness, and confusion,  are similar to symptoms of having too much alcohol. Do not drink alcohol if: Your health care provider tells you not to drink. You are pregnant, may be pregnant, or are planning to become pregnant. If you drink alcohol: Limit how much you have to: 0-1 drink a day for women. 0-2 drinks a day for men. Know how much alcohol is in your drink. In the [place] one drink equals one 12 oz bottle of beer (355 mL), one 5 oz glass of wine (148 mL), or one 1 oz glass of hard liquor (44 mL). Keep yourself hydrated with water, diet soda, or unsweetened iced tea. Keep in mind that regular soda, juice, and other mixers may contain a lot of sugar and must be counted as carbs. What are tips for following this plan?  Reading food labels Start by checking the serving size on the Nutrition Facts label of packaged foods and drinks. The number of calories and the amount of carbs, fats, and other nutrients listed on the label are based on one serving of the item. Many items contain more than one serving per package. Check the total grams (g) of carbs in one serving. Check the number of grams of saturated fats and trans fats in one serving. Choose foods that have a low amount or none of these fats. Check the number of milligrams (mg) of salt (sodium) in one serving. Most people should limit total sodium intake to less than 2,300 mg per day. Always check the nutrition information of foods labeled as "low-fat" or "nonfat." These foods may be higher in added sugar or refined carbs and should be avoided. Talk to your dietitian to identify your daily goals for nutrients listed on the label. Shopping Avoid buying canned, pre-made, or processed foods. These foods tend to be high in fat, sodium, and added sugar. Shop around the outside edge of the grocery store. This is where you will most often find fresh fruits and vegetables, bulk grains, fresh meats, and fresh dairy products. Cooking Use low-heat cooking methods, such as  baking, instead of high-heat cooking methods, such as deep frying. Cook using healthy oils, such as olive, canola, or sunflower oil. Avoid cooking with butter, cream, or high-fat meats. Meal planning Eat meals and snacks regularly, preferably at the same times every day. Avoid going long periods of time without eating. Eat foods that are high in fiber, such as fresh fruits, vegetables, beans, and whole grains. Eat 4-6 oz (112-168 g) of lean protein each day, such as lean meat, chicken, fish, eggs, or tofu. One ounce (oz) (28 g) of lean protein is equal to: 1 oz (28 g) of meat, chicken, or fish. 1 egg.  cup (62 g) of tofu. Eat some foods each day that contain healthy fats, such as avocado, nuts, seeds, and fish. What foods should I eat? Fruits Berries. Apples. Oranges. Peaches. Apricots. Plums. Grapes. Mangoes. Papayas. Pomegranates. Kiwi. Cherries. Vegetables Leafy greens, including lettuce, spinach, kale, chard, collard greens, mustard  greens, and cabbage. Beets. Cauliflower. Broccoli. Carrots. Green beans. Tomatoes. Peppers. Onions. Cucumbers. Brussels sprouts. Grains Whole grains, such as whole-wheat or whole-grain bread, crackers, tortillas, cereal, and pasta. Unsweetened oatmeal. Quinoa. Brown or wild rice. Meats and other proteins Seafood. Poultry without skin. Lean cuts of poultry and beef. Tofu. Nuts. Seeds. Dairy Low-fat or fat-free dairy products such as milk, yogurt, and cheese. The items listed above may not be a complete list of foods and beverages you can eat and drink. Contact a dietitian for more information. What foods should I avoid? Fruits Fruits canned with syrup. Vegetables Canned vegetables. Frozen vegetables with butter or cream sauce. Grains Refined white flour and flour products such as bread, pasta, snack foods, and cereals. Avoid all processed foods. Meats and other proteins Fatty cuts of meat. Poultry with skin. Breaded or fried meats. Processed meat. Avoid  saturated fats. Dairy Full-fat yogurt, cheese, or milk. Beverages Sweetened drinks, such as soda or iced tea. The items listed above may not be a complete list of foods and beverages you should avoid. Contact a dietitian for more information. Questions to ask a health care provider Do I need to meet with a certified diabetes care and education specialist? Do I need to meet with a dietitian? What number can I call if I have questions? When are the best times to check my blood glucose? Where to find more information: American Diabetes Association: diabetes.org Academy of Nutrition and Dietetics: eatright.[employer] of Diabetes and Digestive and Kidney Diseases: StageSync.si Association of Diabetes Care & Education Specialists: diabeteseducator.org Summary It is important to have healthy eating habits because your blood sugar (glucose) levels are greatly affected by what you eat and drink. It is important to use alcohol carefully. A healthy meal plan will help you manage your blood glucose and lower your risk of heart disease. Your health care provider may recommend that you work with a dietitian to make a meal plan that is best for you. This information is not intended to replace advice given to you by your health care provider. Make sure you discuss any questions you have with your health care provider. Document Revised: 08/23/2019 Document Reviewed: 08/23/2019 Elsevier Patient Education  2024 Elsevier Inc.      Signed,   Meredith Staggers, MD [facility], [facility] 01/28/23 11:11 AM

## 2023-01-29 ENCOUNTER — Other Ambulatory Visit: Payer: Self-pay | Admitting: Family Medicine

## 2023-01-29 DIAGNOSIS — I10 Essential (primary) hypertension: Secondary | ICD-10-CM

## 2023-02-01 ENCOUNTER — Telehealth: Payer: Self-pay

## 2023-02-01 DIAGNOSIS — D649 Anemia, unspecified: Secondary | ICD-10-CM

## 2023-02-01 NOTE — Telephone Encounter (Signed)
-----   Message from Shade Flood sent at 01/31/2023  5:26 PM EST ----- Results sent by MyChart, please make sure he receives results and schedule the follow-up lab visit for 1 month for repeat CBC, low hemoglobin, low platelets.  Thanks

## 2023-02-02 NOTE — Telephone Encounter (Addendum)
 Labs ordered for future, patient did review results via mychart and confirmed he understood while we were on the phone this morning, patient was provided options for lab locations and decided on the [place] lab as it is closer to work. Provided address and hours via MyChart message

## 2023-02-02 NOTE — Addendum Note (Signed)
Addended by: Eldred Manges on: 02/02/2023 08:58 AM   Modules accepted: Orders

## 2023-02-10 ENCOUNTER — Telehealth: Payer: Self-pay

## 2023-02-10 DIAGNOSIS — E782 Mixed hyperlipidemia: Secondary | ICD-10-CM

## 2023-02-10 MED ORDER — ATORVASTATIN CALCIUM 20 MG PO TABS: ORAL_TABLET | ORAL | 2 refills | Status: DC

## 2023-02-10 NOTE — Telephone Encounter (Signed)
 Copied from CRM [id number]. Topic: Clinical - Medication Refill >> Feb 10, 2023  1:44 PM Deidre DASEN wrote: Most Recent Primary Care Visit:  Provider: LEVORA PURCHASE R  Department: [facility]  Visit Type: OFFICE VISIT  Date: 01/28/2023  Medication:   metFORMIN  (GLUCOPHAGE ) 1000 MG tablet [[id number]]atorvastatin  (LIPITOR) 20 MG tablet lisinopril  (ZESTRIL ) 10 MG tablet      [facility]  Has the patient contacted their pharmacy? Yes (Agent: If no, request that the patient contact the pharmacy for the refill. If patient does not wish to contact the pharmacy document the reason why and proceed with request.) (Agent: If yes, when and what did the pharmacy advise?)  Is this the correct pharmacy for this prescription? Yes If no, delete pharmacy and type the correct one.  This is the patient's preferred pharmacy:  [facility] #[id number] [place], [place] - [phone] AT [facility] & [address] [place] [address] Phone: [phone] Fax: [phone]   Has the prescription been filled recently? No  Is the patient out of the medication? Yes   Has the patient been seen for an appointment in the last year OR does the patient have an upcoming appointment? Yes  Can we respond through MyChart? No  Agent: Please be advised that Rx refills may take up to 3 business days. We ask that you follow-up with your pharmacy.

## 2023-02-19 ENCOUNTER — Other Ambulatory Visit (INDEPENDENT_AMBULATORY_CARE_PROVIDER_SITE_OTHER): Payer: PRIVATE HEALTH INSURANCE

## 2023-02-19 DIAGNOSIS — D649 Anemia, unspecified: Secondary | ICD-10-CM | POA: Diagnosis not present

## 2023-02-19 LAB — CBC WITH DIFFERENTIAL/PLATELET
Basophils Absolute: 0 10*3/uL (ref 0.0–0.1)
Basophils Relative: 0.6 % (ref 0.0–3.0)
Eosinophils Absolute: 0.1 10*3/uL (ref 0.0–0.7)
Eosinophils Relative: 2.1 % (ref 0.0–5.0)
HCT: 37.6 % — ABNORMAL LOW (ref 39.0–52.0)
Hemoglobin: 12.3 g/dL — ABNORMAL LOW (ref 13.0–17.0)
Lymphocytes Relative: 25.4 % (ref 12.0–46.0)
Lymphs Abs: 1.1 10*3/uL (ref 0.7–4.0)
MCHC: 32.8 g/dL (ref 30.0–36.0)
MCV: 83.7 fL (ref 78.0–100.0)
Monocytes Absolute: 0.7 10*3/uL (ref 0.1–1.0)
Monocytes Relative: 15.3 % — ABNORMAL HIGH (ref 3.0–12.0)
Neutro Abs: 2.5 10*3/uL (ref 1.4–7.7)
Neutrophils Relative %: 56.6 % (ref 43.0–77.0)
Platelets: 133 10*3/uL — ABNORMAL LOW (ref 150.0–400.0)
RBC: 4.49 Mil/uL (ref 4.22–5.81)
RDW: 15.3 % (ref 11.5–15.5)
WBC: 4.4 10*3/uL (ref 4.0–10.5)

## 2023-02-24 ENCOUNTER — Encounter: Payer: Self-pay | Admitting: Family Medicine

## 2023-02-25 ENCOUNTER — Telehealth: Payer: Self-pay | Admitting: Family Medicine

## 2023-02-25 DIAGNOSIS — I10 Essential (primary) hypertension: Secondary | ICD-10-CM

## 2023-02-25 MED ORDER — METFORMIN HCL 1000 MG PO TABS: ORAL_TABLET | ORAL | 2 refills | Status: DC

## 2023-02-25 NOTE — Addendum Note (Signed)
Addended by: Eldred Manges on: 02/25/2023 09:28 AM   Modules accepted: Orders

## 2023-02-25 NOTE — Telephone Encounter (Signed)
Encourage patient to contact the pharmacy for refills or they can request refills through [facility]  WHAT PHARMACY WOULD THEY LIKE THIS SENT TO:  [facility] #[id number] - [place], [place] - [address] AT [address]    MEDICATION NAME & DOSE: metFORMIN (GLUCOPHAGE) 1000 MG tablet   NOTES/COMMENTS FROM PATIENT:      Front office please notify patient: It takes 48-72 hours to process rx refill requests Ask patient to call pharmacy to ensure rx is ready before heading there.

## 2023-02-25 NOTE — Telephone Encounter (Signed)
Sent medication, will call patient to notify him

## 2023-02-25 NOTE — Progress Notes (Signed)
Pt has visit 03/04/2023

## 2023-02-25 NOTE — Telephone Encounter (Signed)
Patient has been informed no concern

## 2023-03-04 ENCOUNTER — Ambulatory Visit: Payer: PRIVATE HEALTH INSURANCE | Admitting: Family Medicine

## 2023-03-04 VITALS — BP 130/74 | HR 77 | Temp 97.9°F | Ht 69.0 in | Wt 213.0 lb

## 2023-03-04 DIAGNOSIS — D649 Anemia, unspecified: Secondary | ICD-10-CM

## 2023-03-04 DIAGNOSIS — D696 Thrombocytopenia, unspecified: Secondary | ICD-10-CM

## 2023-03-04 LAB — IFOBT (OCCULT BLOOD): IFOBT: NEGATIVE

## 2023-03-04 NOTE — Patient Instructions (Signed)
I will repeat blood test today.  If the iron level is low, we can have you start an iron supplement but I will refer you to hematology to look into causes for the iron deficiency.  Test for blood in the stool was negative or normal here today.  Let me know if there are questions or any new symptoms.

## 2023-03-04 NOTE — Progress Notes (Signed)
Subjective:  Patient ID: Ian Salazar, male    DOB: [birth year]  Age: 63 y.o. MRN: [id number]  CC:  Chief Complaint  Patient presents with   Results    Pt back due to being told low iron when he went to give blood     HPI TAHJAE CLAUSING presents for   Anemia  -Low hemoglobin on outside labs noted at his 01/28/2023 visit.  Hemoglobin of 12.5 on 01/12/2023, previously 13.3 in March of last year.  He denied any melena, hematochezia.  Colonoscopy was in 2023.  Repeat CBC on 12/26 was low at 12.8 with platelets of 145, compared to 199 in 2016. Labs again repeated on 02/19/2023 with platelets 133, hemoglobin 12.3, MCV 83.  Tried to donate blood about a year ago and then 6 months ago, but unable to donate as he had low iron. Colonoscopy 06/27/2021.  History of adenomatous polyps.  He did have a 6 mm polyp in the descending colon that was removed, resected and retrieved.  Diverticulosis in the sigmoid and ascending colon.   No new fatigue, lightheadedness.  Stopped smoking 2 weeks ago - doing ok.  No iron supplements. Possible dark stool 1-2 months ago - single episode, no recurrence. No BRBPR.  No hematuria.  No hemoptysis.  No aspirin products - off past year.   No diet changes - minimal red meat but no changes.   Lab Results  Component Value Date   IRON 67 07/17/2011   FERRITIN 193.9 07/17/2011     Lab Results  Component Value Date   WBC 4.4 02/19/2023   HGB 12.3 (L) 02/19/2023   HCT 37.6 (L) 02/19/2023   MCV 83.7 02/19/2023   PLT 133.0 (L) 02/19/2023    History Patient Active Problem List   Diagnosis Date Noted   Family history of pancreatic cancer 12/09/2020   Uncontrolled type 2 diabetes mellitus with insulin therapy 04/22/2017   Hx of adenomatous colonic polyps 03/28/2015   Routine general medical examination at a health care facility 07/17/2011   Mixed hyperlipidemia 07/17/2011   Abnormal chest x-ray 07/17/2011   Vitamin B 12 deficiency 04/22/2010   ANEMIA-NOS  04/18/2010   TOBACCO USE 04/18/2010   DEGENERATIVE JOINT DISEASE, KNEE 10/24/2008   DM w/o complication type II 02/27/2008   Past Medical History:  Diagnosis Date   Abnormal EKG    Normal ETT 5 years ago   Anemia    Family history of pancreatic cancer    GERD (gastroesophageal reflux disease)    occasional   HTN (hypertension)    Hx of adenomatous colonic polyps 03/28/2015   Hyperlipidemia    Knee pain, right    Rotator cuff tendonitis    Left   Type II or unspecified type diabetes mellitus without mention of complication, not stated as uncontrolled    Past Surgical History:  Procedure Laterality Date   COLONOSCOPY W/ POLYPECTOMY  03/25/2015   MULTIPLE TOOTH EXTRACTIONS     POLYPECTOMY     UPPER GASTROINTESTINAL ENDOSCOPY  10/17/2019   No Known Allergies Prior to Admission medications   Medication Sig Start Date End Date Taking? Authorizing Provider  atorvastatin (LIPITOR) 20 MG tablet TAKE 1 TABLET(20 MG) BY MOUTH DAILY 02/10/23  Yes Shade Flood, MD  blood glucose meter kit and supplies KIT Dispense based on patient and insurance preference. Check your fasting blood sugars daily. (FOR ICD-9 250.00, 250.01). 12/12/16  Yes Wallis Bamberg, PA-C  glucose blood (ONETOUCH ULTRA) test strip USE TO TEST  FASTING BLOOD SUGARS DAILY 05/25/22  Yes Shade Flood, MD  lisinopril (ZESTRIL) 10 MG tablet TAKE 1 TABLET(10 MG) BY MOUTH DAILY 01/29/23  Yes Shade Flood, MD  metFORMIN (GLUCOPHAGE) 1000 MG tablet TAKE 1 TABLET(1000 MG) BY MOUTH TWICE DAILY WITH A MEAL 02/25/23  Yes Shade Flood, MD  omeprazole (PRILOSEC) 20 MG capsule TAKE 1 CAPSULE(20 MG) BY MOUTH DAILY 02/12/22  Yes Cirigliano, Vito V, DO  ONETOUCH DELICA LANCETS 33G MISC USE TO TEST FASTING BLOOD SUGARS DAILY 02/23/17  Yes Wallis Bamberg, PA-C   Social History   Socioeconomic History   Marital status: Married    Spouse name: Not on file   Number of children: 1   Years of education: Not on file   Highest education  level: 12th grade  Occupational History   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    Education: [employer].   Cigarette smoker, no drug use no alcohol since [age]      Social Drivers of Corporate investment banker Strain: Low Risk  (03/04/2023)   Overall Financial Resource Strain (CARDIA)    Difficulty of Paying Living Expenses: Not hard at all  Food Insecurity: No Food Insecurity (03/04/2023)   Hunger Vital Sign    Worried About Running Out of Food in the Last Year: Never true    Ran Out of Food in the Last Year: Never true  Transportation Needs: No Transportation Needs (03/04/2023)   PRAPARE - Administrator, Civil Service (Medical): No    Lack of Transportation (Non-Medical): No  Physical Activity: Insufficiently Active (03/04/2023)   Exercise Vital Sign    Days of Exercise per Week: 3 days    Minutes of Exercise per Session: 30 min  Stress: No Stress Concern Present (03/04/2023)   [employer] of Occupational Health - Occupational Stress Questionnaire    Feeling of Stress : Not at all  Social Connections: Socially Integrated (03/04/2023)   Social Connection and Isolation Panel [NHANES]    Frequency of Communication with Friends and Family: Three times a week    Frequency of Social Gatherings with Friends and Family: Once a week    Attends Religious Services: More than 4  times per year    Active Member of [employer] or Organizations: Yes    Attends Banker Meetings: More than 4 times per year    Marital Status: Married  Catering manager Violence: Unknown (05/27/2021)   Received from [facility]   HITS    Physically Hurt: Not on file    Insult or Talk Down To: Not on file    Threaten Physical Harm: Not on file    Scream or Curse: Not on file    Review of Systems   Objective:  Vitals:   03/04/23 1336  BP: 130/74  Pulse: 77  Temp: 97.9 F (36.6 C)  TempSrc: Temporal  SpO2: 100%  Weight: 213 lb (96.6 kg)  Height: 5\' 9"  (1.753 m)     Physical Exam Vitals reviewed.  Constitutional:      Appearance: He is well-developed.  HENT:     Head: Normocephalic and atraumatic.  Neck:     Vascular: No carotid bruit or JVD.  Cardiovascular:     Rate and Rhythm: Normal rate and regular rhythm.     Heart sounds: Normal heart sounds. No murmur heard. Pulmonary:     Effort: Pulmonary effort is normal.     Breath sounds: Normal breath sounds. No rales.  Abdominal:     General: Abdomen is flat. There is no distension.     Tenderness: There is no abdominal tenderness.  Genitourinary:    Rectum: Guaiac result negative.  Musculoskeletal:     Right lower leg: No edema.     Left lower leg: No edema.  Skin:    General: Skin is warm and dry.  Neurological:     Mental Status: He is alert and oriented to person, place, and time.  Psychiatric:        Mood and Affect: Mood normal.     Assessment & Plan:  GERRY BLANCHFIELD is a 63 y.o. male . Anemia, unspecified type - Plan: Iron, TIBC and Ferritin Panel, CBC with Differential/Platelet, IFOBT POC (occult bld, rslt in office)  Thrombocytopenia (HCC) - Plan: CBC with Differential/Platelet Mild normocytic anemia.  Reportedly has been told that he had low iron on previous blood donation attempt.  Question whether this was truly low iron or just low hemoglobin.  He does have a thrombocytopenia with  slight decrease platelets from December 26 through January 17.  He is up-to-date with colonoscopy with previous polyp as above.  Heme-negative stool in office.  Repeat CBC, iron testing and likely hematology follow-up.  Asymptomatic at this time, RTC precautions given.  No orders of the defined types were placed in this encounter.  Patient Instructions  I will repeat blood test today.  If the iron level is low, we can have you start an iron supplement but I will refer you to hematology to look into causes for the iron deficiency.  Test for blood in the stool was negative or normal here today.  Let me know if there are questions or any new symptoms.    Signed,   Meredith Staggers, MD [facility], [facility] 03/04/23 2:21 PM

## 2023-03-05 LAB — CBC WITH DIFFERENTIAL/PLATELET
Basophils Absolute: 0 10*3/uL (ref 0.0–0.1)
Basophils Relative: 0.6 % (ref 0.0–3.0)
Eosinophils Absolute: 0.1 10*3/uL (ref 0.0–0.7)
Eosinophils Relative: 1.3 % (ref 0.0–5.0)
HCT: 39.2 % (ref 39.0–52.0)
Hemoglobin: 12.5 g/dL — ABNORMAL LOW (ref 13.0–17.0)
Lymphocytes Relative: 24.4 % (ref 12.0–46.0)
Lymphs Abs: 1.1 10*3/uL (ref 0.7–4.0)
MCHC: 31.9 g/dL (ref 30.0–36.0)
MCV: 84.7 fL (ref 78.0–100.0)
Monocytes Absolute: 0.5 10*3/uL (ref 0.1–1.0)
Monocytes Relative: 11.6 % (ref 3.0–12.0)
Neutro Abs: 2.9 10*3/uL (ref 1.4–7.7)
Neutrophils Relative %: 62.1 % (ref 43.0–77.0)
Platelets: 173 10*3/uL (ref 150.0–400.0)
RBC: 4.63 Mil/uL (ref 4.22–5.81)
RDW: 14.7 % (ref 11.5–15.5)
WBC: 4.7 10*3/uL (ref 4.0–10.5)

## 2023-03-05 LAB — IRON,TIBC AND FERRITIN PANEL
%SAT: 24 % (ref 20–48)
Ferritin: 11 ng/mL — ABNORMAL LOW (ref 24–380)
Iron: 92 ug/dL (ref 50–180)
TIBC: 389 ug/dL (ref 250–425)

## 2023-03-14 ENCOUNTER — Encounter: Payer: Self-pay | Admitting: Family Medicine

## 2023-03-14 ENCOUNTER — Other Ambulatory Visit: Payer: Self-pay | Admitting: Family Medicine

## 2023-03-14 DIAGNOSIS — D649 Anemia, unspecified: Secondary | ICD-10-CM

## 2023-03-14 NOTE — Progress Notes (Signed)
 Referral placed to hematology for persistent anemia.

## 2023-03-31 ENCOUNTER — Other Ambulatory Visit: Payer: Self-pay | Admitting: Emergency Medicine

## 2023-03-31 ENCOUNTER — Telehealth: Payer: Self-pay | Admitting: Acute Care

## 2023-03-31 DIAGNOSIS — Z122 Encounter for screening for malignant neoplasm of respiratory organs: Secondary | ICD-10-CM

## 2023-03-31 DIAGNOSIS — Z87891 Personal history of nicotine dependence: Secondary | ICD-10-CM

## 2023-03-31 NOTE — Telephone Encounter (Signed)
 Lung Cancer Screening Narrative/Criteria Questionnaire (Cigarette Smokers Only- No Cigars/Pipes/vapes)   Ian Salazar   SDMV:04/14/2023 at 9:00am with Vernona Rieger Gleason PA        12/20/1960   LDCT: 04/16/2023 at 4:00pm at [facility]    63 y.o.   Phone: [phone]  Lung Screening Narrative (confirm age 35-77 yrs Medicare / 50-80 yrs Private pay insurance)   Insurance information:[employer]    Referring Provider:Dr. Neva Seat, PCP   This screening involves an initial phone call with a team member from our program. It is called a shared decision making visit. The initial meeting is required by  insurance and Medicare to make sure you understand the program. This appointment takes about 15-20 minutes to complete. You will complete the screening scan at your scheduled date/time.  This scan takes about 5-10 minutes to complete. You can eat and drink normally before and after the scan.  Criteria questions for Lung Cancer Screening:   Are you a current or former smoker? Former Age began smoking: 63yo   If you are a former smoker, what year did you quit smoking? Patient states he quit mid 03/2023(within 15 yrs)   To calculate your smoking history, I need an accurate estimate of how many packs of cigarettes you smoked per day and for how many years. (Not just the number of PPD you are now smoking)   Years smoking 48 x Packs per day 3/4 = Pack years 36   (at least 20 pack yrs)   (Make sure they understand that we need to know how much they have smoked in the past, not just the number of PPD they are smoking now)  Do you have a personal history of cancer?  No    Do you have a family history of cancer? Yes  (cancer type and and relative) Mother - lung, Brother - pancreatic   Are you coughing up blood?  No  Have you had unexplained weight loss of 15 lbs or more in the last 6 months? No  It looks like you meet all criteria.  When would be a good time for [place] to schedule you for this  screening?   Additional information: N/A

## 2023-04-14 ENCOUNTER — Ambulatory Visit: Payer: PRIVATE HEALTH INSURANCE | Admitting: Physician Assistant

## 2023-04-14 ENCOUNTER — Encounter: Payer: Self-pay | Admitting: Physician Assistant

## 2023-04-14 DIAGNOSIS — Z87891 Personal history of nicotine dependence: Secondary | ICD-10-CM

## 2023-04-14 NOTE — Patient Instructions (Signed)

## 2023-04-14 NOTE — Progress Notes (Signed)
 Virtual Visit via Telephone Note  I connected with Ian Salazar on 04/14/23 at 9:06 AM by telephone and verified that I am speaking with the correct person using two identifiers.  Location: Patient: home Provider: working virtually from home   I discussed the limitations, risks, security and privacy concerns of performing an evaluation and management service by telephone and the availability of in person appointments. I also discussed with the patient that there may be a patient responsible charge related to this service. The patient expressed understanding and agreed to proceed.       Shared Decision Making Visit Lung Cancer Screening Program [phone])   Eligibility: Age 63 Pack Years Smoking History Calculation 4 (# packs/per year x # years smoked) Recent History of coughing up blood  no Unexplained weight loss? No ( >Than 15 pounds within the last 6 months ) Prior History Lung / other cancer No (Diagnosis within the last 5 years already requiring surveillance chest CT Scans). Smoking Status Former Smoker Former Smokers: Years since quit: < 1 year  Quit Date: 03/2023  Visit Components: Discussion included one or more decision making aids? Yes Discussion included risk/benefits of screening. Yes Discussion included potential follow up diagnostic testing for abnormal scans. Yes Discussion included meaning and risk of over diagnosis. Yes Discussion included meaning and risk of False Positives. Yes Discussion included meaning of total radiation exposure. Yes  Counseling Included: Importance of adherence to annual lung cancer LDCT screening. Yes Impact of comorbidities on ability to participate in the program. Yes Ability and willingness to under diagnostic treatment. Yes  Smoking Cessation Counseling: Former Smokers:  Discussed the importance of maintaining cigarette abstinence. Yes Diagnosis Code: Personal History of Nicotine Dependence. X91.478 Information about  tobacco cessation classes and interventions provided to patient. Yes Written Order for Lung Cancer Screening with LDCT placed in Epic. Yes (CT Chest Lung Cancer Screening Low Dose W/O CM) [id number] Z12.2-Screening of respiratory organs Z87.891-Personal history of nicotine dependence    I spent 25 minutes of face to face time/virtual visit time  with the patient discussing the risks and benefits of lung cancer screening. We took the time to pause at intervals to allow for questions to be asked and answered to ensure understanding. We discussed that they had taken the single most powerful action possible to decrease their risk of developing lung cancer when they quit smoking. I counseled them to remain smoke free, and to contact the office if they ever had the desire to smoke again so that we can provide resources and tools to help support the effort to remain smoke free. We discussed the time and location of the scan, and they  will receive a call or letter with the results within  24-72 hours of receiving them. They have the office contact information in the event they have questions.   They verbalized understanding of all of the above and had no further questions.    I explained to the patient that there has been a high incidence of coronary artery disease noted on these exams. I explained that this is a non-gated exam therefore degree or severity cannot be determined. This patient is on statin therapy. I have asked the patient to follow-up with their PCP regarding any incidental finding of coronary artery disease and management with diet or medication as they feel is clinically indicated. The patient verbalized understanding of the above and had no further questions.      Ian Gasman Rickeya Manus, PA-C

## 2023-04-16 ENCOUNTER — Ambulatory Visit
Admission: RE | Admit: 2023-04-16 | Discharge: 2023-04-16 | Disposition: A | Discharge status: Home / Self Care | Payer: PRIVATE HEALTH INSURANCE | Source: Ambulatory Visit | Attending: Acute Care | Admitting: Acute Care

## 2023-04-16 DIAGNOSIS — Z122 Encounter for screening for malignant neoplasm of respiratory organs: Secondary | ICD-10-CM

## 2023-04-16 DIAGNOSIS — Z87891 Personal history of nicotine dependence: Secondary | ICD-10-CM

## 2023-04-19 ENCOUNTER — Encounter: Payer: PRIVATE HEALTH INSURANCE | Admitting: Internal Medicine

## 2023-04-19 ENCOUNTER — Other Ambulatory Visit: Payer: PRIVATE HEALTH INSURANCE

## 2023-04-28 ENCOUNTER — Encounter: Payer: Self-pay | Admitting: Family Medicine

## 2023-04-28 ENCOUNTER — Ambulatory Visit: Payer: PRIVATE HEALTH INSURANCE | Admitting: Family Medicine

## 2023-04-28 VITALS — BP 114/66 | HR 68 | Temp 98.8°F | Ht 69.0 in | Wt 213.8 lb

## 2023-04-28 DIAGNOSIS — D649 Anemia, unspecified: Secondary | ICD-10-CM | POA: Diagnosis not present

## 2023-04-28 DIAGNOSIS — E782 Mixed hyperlipidemia: Secondary | ICD-10-CM

## 2023-04-28 DIAGNOSIS — E785 Hyperlipidemia, unspecified: Secondary | ICD-10-CM | POA: Insufficient documentation

## 2023-04-28 DIAGNOSIS — M25561 Pain in right knee: Secondary | ICD-10-CM

## 2023-04-28 DIAGNOSIS — E1165 Type 2 diabetes mellitus with hyperglycemia: Secondary | ICD-10-CM

## 2023-04-28 DIAGNOSIS — I10 Essential (primary) hypertension: Secondary | ICD-10-CM

## 2023-04-28 DIAGNOSIS — K219 Gastro-esophageal reflux disease without esophagitis: Secondary | ICD-10-CM | POA: Insufficient documentation

## 2023-04-28 DIAGNOSIS — Z6830 Body mass index (BMI) 30.0-30.9, adult: Secondary | ICD-10-CM | POA: Insufficient documentation

## 2023-04-28 DIAGNOSIS — Z7984 Long term (current) use of oral hypoglycemic drugs: Secondary | ICD-10-CM

## 2023-04-28 LAB — COMPREHENSIVE METABOLIC PANEL
ALT: 15 U/L (ref 0–53)
AST: 17 U/L (ref 0–37)
Albumin: 4.4 g/dL (ref 3.5–5.2)
Alkaline Phosphatase: 59 U/L (ref 39–117)
BUN: 14 mg/dL (ref 6–23)
CO2: 32 meq/L (ref 19–32)
Calcium: 9.6 mg/dL (ref 8.4–10.5)
Chloride: 101 meq/L (ref 96–112)
Creatinine, Ser: 0.9 mg/dL (ref 0.40–1.50)
GFR: 91.19 mL/min (ref >60.00)
Glucose, Bld: 171 mg/dL — ABNORMAL HIGH (ref 70–99)
Potassium: 4.2 meq/L (ref 3.5–5.1)
Sodium: 140 meq/L (ref 135–145)
Total Bilirubin: 0.4 mg/dL (ref 0.2–1.2)
Total Protein: 7.5 g/dL (ref 6.0–8.3)

## 2023-04-28 LAB — MICROALBUMIN / CREATININE URINE RATIO
Creatinine,U: 42.6 mg/dL
Microalb Creat Ratio: UNDETERMINED mg/g (ref 0.0–30.0)
Microalb, Ur: -0.1 mg/dL — ABNORMAL LOW (ref 0.0–1.9)

## 2023-04-28 LAB — LIPID PANEL
Cholesterol: 119 mg/dL (ref 0–200)
HDL: 55.4 mg/dL (ref >39.00)
LDL Cholesterol: 57 mg/dL (ref 0–99)
NonHDL: 63.97
Total CHOL/HDL Ratio: 2
Triglycerides: 36 mg/dL (ref 0.0–149.0)
VLDL: 7.2 mg/dL (ref 0.0–40.0)

## 2023-04-28 LAB — HEMOGLOBIN A1C: Hgb A1c MFr Bld: 8 % — ABNORMAL HIGH (ref 4.6–6.5)

## 2023-04-28 NOTE — Progress Notes (Signed)
 Subjective:  Patient ID: Ian Salazar, male    DOB: [birth year]  Age: 63 y.o. MRN: [id number]  CC:  Chief Complaint  Patient presents with   Medical Management of Chronic Issues    Pt is doing well no questions    Knee Pain    Pt notes Rt knee has been bothering him the last few days after doing some yard work notes theres a popping aspect now     HPI Ian Salazar presents for   Anemia Discussed in January.  Low hemoglobin on outside labs noted in December.  Asymptomatic at that time.  No iron supplements at that time.  Colonoscopy in 2023 with adenomatous polyp.  No hematuria, hemoptysis, or bright red blood per rectum.  No aspirin products.  No iron supplements.  Will be seen by hematology tomorrow.  Less heartburn off cigarettes. Still taking omeprazole - usually well controlled with daily PPI.   Lab Results  Component Value Date   IRON 92 03/04/2023   TIBC 389 03/04/2023   FERRITIN 11 (L) 03/04/2023   Lab Results  Component Value Date   WBC 4.7 03/04/2023   HGB 12.5 (L) 03/04/2023   HCT 39.2 03/04/2023   MCV 84.7 03/04/2023   PLT 173.0 03/04/2023   Diabetes: With hyperglycemia. Labs through his wellness program at work, [employer].  A1c 7.7 on December 10.  Improved from previous 8.9 as below.  Had been taking metformin 1000 mg twice daily without missed doses.  Planned on improved diet, exercise. Minimal change - some snacking with quitting smoking.   Microalbumin: Normal ratio 05/13/2022. Optho, foot exam, pneumovax:  UTD   Wt Readings from Last 3 Encounters:  04/28/23 213 lb 12.8 oz (97 kg)  03/04/23 213 lb (96.6 kg)  01/28/23 216 lb 6.4 oz (98.2 kg)    Lab Results  Component Value Date   HGBA1C 8.9 (H) 05/13/2022   HGBA1C 7.5 (H) 05/29/2021   HGBA1C 7.1 (A) 02/27/2021   Lab Results  Component Value Date   MICROALBUR <0.7 05/13/2022   LDLCALC 50 05/29/2021   CREATININE 0.80 05/29/2021   Hypertension: Lisinopril 10 mg daily. Home  readings: none BP Readings from Last 3 Encounters:  04/28/23 114/66  03/04/23 130/74  01/28/23 122/68   Lab Results  Component Value Date   CREATININE 0.80 05/29/2021   Hyperlipidemia: See December visit, labs through health screening at work.  He is taking Lipitor 20 Mg daily. Repeat labs today.  Lab Results  Component Value Date   CHOL 112 05/29/2021   HDL 53.80 05/29/2021   LDLCALC 50 05/29/2021   TRIG 43.0 05/29/2021   CHOLHDL 2 05/29/2021   Lab Results  Component Value Date   ALT 14 05/29/2021   AST 16 05/29/2021   ALKPHOS 61 05/29/2021   BILITOT 0.4 05/29/2021   Knee pain R knee pain. Jumped off back of truck when spreading mulch 1 week. No known injury. Sore when woke up next day. Popping feeling, but not locking or giving way. Not painful, just popping feeling.  XR R knee in 2010-  effusion, o/w normal.  Tx: none.   Lung cancer screening: CT 04/16/2023 report pending.    History Patient Active Problem List   Diagnosis Date Noted   Body mass index (BMI) 30.0-30.9, adult 04/28/2023   Gastro-esophageal reflux disease without esophagitis 04/28/2023   Hyperlipidemia 04/28/2023   Family history of pancreatic cancer 12/09/2020   Pain of left heel 03/07/2020   Hx of adenomatous  colonic polyps 03/28/2015   History of adenomatous polyp of colon 03/28/2015   Routine general medical examination at a health care facility 07/17/2011   Mixed hyperlipidemia 07/17/2011   Abnormal chest x-ray 07/17/2011   Vitamin B 12 deficiency 04/22/2010   ANEMIA-NOS 04/18/2010   Anemia 04/18/2010   DEGENERATIVE JOINT DISEASE, KNEE 10/24/2008   Osteoarthritis 10/24/2008   DM w/o complication type II 02/27/2008   Past Medical History:  Diagnosis Date   Abnormal EKG    Normal ETT 5 years ago   Anemia    Family history of pancreatic cancer    GERD (gastroesophageal reflux disease)    occasional   HTN (hypertension)    Hx of adenomatous colonic polyps 03/28/2015   Hyperlipidemia     Knee pain, right    Rotator cuff tendonitis    Left   Type II or unspecified type diabetes mellitus without mention of complication, not stated as uncontrolled    Past Surgical History:  Procedure Laterality Date   COLONOSCOPY W/ POLYPECTOMY  03/25/2015   MULTIPLE TOOTH EXTRACTIONS     POLYPECTOMY     UPPER GASTROINTESTINAL ENDOSCOPY  10/17/2019   No Known Allergies Prior to Admission medications   Medication Sig Start Date End Date Taking? Authorizing Provider  atorvastatin (LIPITOR) 20 MG tablet TAKE 1 TABLET(20 MG) BY MOUTH DAILY 02/10/23  Yes Shade Flood, MD  blood glucose meter kit and supplies KIT Dispense based on patient and insurance preference. Check your fasting blood sugars daily. (FOR ICD-9 250.00, 250.01). 12/12/16  Yes Wallis Bamberg, PA-C  glucose blood (ONETOUCH ULTRA) test strip USE TO TEST FASTING BLOOD SUGARS DAILY 05/25/22  Yes Shade Flood, MD  lisinopril (ZESTRIL) 10 MG tablet TAKE 1 TABLET(10 MG) BY MOUTH DAILY 01/29/23  Yes Shade Flood, MD  metFORMIN (GLUCOPHAGE) 1000 MG tablet TAKE 1 TABLET(1000 MG) BY MOUTH TWICE DAILY WITH A MEAL 02/25/23  Yes Shade Flood, MD  omeprazole (PRILOSEC) 20 MG capsule TAKE 1 CAPSULE(20 MG) BY MOUTH DAILY 02/12/22  Yes Cirigliano, Vito V, DO  ONETOUCH DELICA LANCETS 33G MISC USE TO TEST FASTING BLOOD SUGARS DAILY 02/23/17  Yes Wallis Bamberg, PA-C   Social History   Socioeconomic History   Marital status: Married    Spouse name: Not on file   Number of children: 1   Years of education: Not on file   Highest education level: 12th grade  Occupational History   Occupation: [employer]  Tobacco Use   Smoking status: Every Day    Current packs/day: 0.75    Average packs/day: 0.8 packs/day for 48.0 years (36.0 ttl pk-yrs)    Types: Cigarettes   Smokeless tobacco: Never  Vaping Use   Vaping status: Never Used  Substance and Sexual Activity   Alcohol use: Not Currently    Comment: 09/07/19-none in 2 1/2 years   Drug  use: No   Sexual activity: Yes  Other Topics Concern   Not on file  Social History Narrative   Married.  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    Education: [employer].   Cigarette smoker, no drug use no alcohol since [age]      Social Drivers of Corporate investment banker Strain: Low Risk  (03/04/2023)   Overall Financial Resource Strain (CARDIA)    Difficulty of Paying Living  Expenses: Not hard at all  Food Insecurity: No Food Insecurity (03/04/2023)   Hunger Vital Sign    Worried About Running Out of Food in the Last Year: Never true    Ran Out of Food in the Last Year: Never true  Transportation Needs: No Transportation Needs (03/04/2023)   PRAPARE - Administrator, Civil Service (Medical): No    Lack of Transportation (Non-Medical): No  Physical Activity: Insufficiently Active (03/04/2023)   Exercise Vital Sign    Days of Exercise per Week: 3 days    Minutes of Exercise per Session: 30 min  Stress: No Stress Concern Present (03/04/2023)   [employer] of Occupational Health - Occupational Stress Questionnaire    Feeling of Stress : Not at all  Social Connections: Socially Integrated (03/04/2023)   Social Connection and Isolation Panel [NHANES]    Frequency of Communication with Friends and Family: Three times a week    Frequency of Social Gatherings with Friends and Family: Once a week    Attends Religious Services: More than 4 times per year    Active Member of [employer] or Organizations: Yes    Attends Banker Meetings: More than 4 times per year    Marital Status: Married  Catering manager Violence: Unknown (05/27/2021)   Received from [facility]   HITS    Physically Hurt: Not on file    Insult or Talk Down To: Not on file    Threaten Physical Harm: Not on file    Scream or Curse: Not on file    Review  of Systems Per HPI  Objective:   Vitals:   04/28/23 0840  BP: 114/66  Pulse: 68  Temp: 98.8 F (37.1 C)  TempSrc: Temporal  SpO2: 98%  Weight: 213 lb 12.8 oz (97 kg)  Height: 5\' 9"  (1.753 m)     Physical Exam Vitals reviewed.  Constitutional:      Appearance: He is well-developed.  HENT:     Head: Normocephalic and atraumatic.  Neck:     Vascular: No carotid bruit or JVD.  Cardiovascular:     Rate and Rhythm: Normal rate and regular rhythm.     Heart sounds: Normal heart sounds. No murmur heard. Pulmonary:     Effort: Pulmonary effort is normal.     Breath sounds: Normal breath sounds. No rales.  Musculoskeletal:     Right lower leg: No edema.     Left lower leg: No edema.     Comments: Right knee, crepitus but no focal bony tenderness, full range of motion, no appreciable effusion.  Negative varus/valgus testing and McMurray.  Ambulating without assistive device.  Skin intact without erythema or ecchymosis.  Skin:    General: Skin is warm and dry.  Neurological:     Mental Status: He is alert and oriented to person, place, and time.  Psychiatric:        Mood and Affect: Mood normal.      Assessment & Plan:  Ian Salazar is a 63 y.o. male . Essential hypertension  -Stable on current regimen, check labs below, continue same dose lisinopril.  Type 2 diabetes mellitus with hyperglycemia, without long-term current use of insulin (HCC) - Plan: Comprehensive metabolic panel, Hemoglobin A1c, Microalbumin / creatinine urine ratio  -Tolerating metformin, continue same, check labs and adjust plan accordingly.  Continue ACE inhibitor and statin.  Anemia, unspecified type  -Close follow-up planned with hematology, no changes for now.  Mixed hyperlipidemia -  Plan: Lipid panel  -Check levels, continue same dose Lipitor, adjust plan accordingly based on results.  Acute pain of right knee  -Popping sensation without significant pain on exam.  No change in function.   Symptomatic care, continue to monitor for now.  If not improving recommend imaging, consider Ortho eval, RTC precautions.  No orders of the defined types were placed in this encounter.  Patient Instructions  Thank you for coming in today.  No medication changes at this time.  If any concerns on labs I will let you know.  Tylenol if needed for knee pain but overall reassuring exam today.  If the popping sensation does not improve in the next week or 2, let me know and I would probably start with x-rays initially and possible orthopedic evaluation.  Be seen if any instability, giving way, or worsening pain.  Let me know if there are questions.  Keep follow-up with specialist as planned.  Recheck in 3 months.  Take care!    Signed,   Meredith Staggers, MD [facility], [facility] 04/28/23 9:46 AM

## 2023-04-28 NOTE — Patient Instructions (Signed)
 Thank you for coming in today.  No medication changes at this time.  If any concerns on labs I will let you know.  Tylenol if needed for knee pain but overall reassuring exam today.  If the popping sensation does not improve in the next week or 2, let me know and I would probably start with x-rays initially and possible orthopedic evaluation.  Be seen if any instability, giving way, or worsening pain.  Let me know if there are questions.  Keep follow-up with specialist as planned.  Recheck in 3 months.  Take care!

## 2023-04-29 ENCOUNTER — Inpatient Hospital Stay: Payer: PRIVATE HEALTH INSURANCE | Admitting: Hematology and Oncology

## 2023-04-29 ENCOUNTER — Inpatient Hospital Stay: Payer: PRIVATE HEALTH INSURANCE | Attending: Hematology and Oncology

## 2023-04-29 VITALS — BP 136/72 | HR 71 | Temp 98.2°F | Resp 14 | Wt 214.8 lb

## 2023-04-29 DIAGNOSIS — Z801 Family history of malignant neoplasm of trachea, bronchus and lung: Secondary | ICD-10-CM | POA: Diagnosis not present

## 2023-04-29 DIAGNOSIS — F17210 Nicotine dependence, cigarettes, uncomplicated: Secondary | ICD-10-CM | POA: Insufficient documentation

## 2023-04-29 DIAGNOSIS — D500 Iron deficiency anemia secondary to blood loss (chronic): Secondary | ICD-10-CM

## 2023-04-29 DIAGNOSIS — Z800 Family history of malignant neoplasm of digestive organs: Secondary | ICD-10-CM | POA: Diagnosis not present

## 2023-04-29 DIAGNOSIS — D509 Iron deficiency anemia, unspecified: Secondary | ICD-10-CM | POA: Insufficient documentation

## 2023-04-29 LAB — CBC WITH DIFFERENTIAL (CANCER CENTER ONLY)
Abs Immature Granulocytes: 0.05 10*3/uL (ref 0.00–0.07)
Basophils Absolute: 0 10*3/uL (ref 0.0–0.1)
Basophils Relative: 1 %
Eosinophils Absolute: 0.1 10*3/uL (ref 0.0–0.5)
Eosinophils Relative: 1 %
HCT: 39.9 % (ref 39.0–52.0)
Hemoglobin: 12.6 g/dL — ABNORMAL LOW (ref 13.0–17.0)
Immature Granulocytes: 1 %
Lymphocytes Relative: 26 %
Lymphs Abs: 1 10*3/uL (ref 0.7–4.0)
MCH: 26.9 pg (ref 26.0–34.0)
MCHC: 31.6 g/dL (ref 30.0–36.0)
MCV: 85.3 fL (ref 80.0–100.0)
Monocytes Absolute: 0.4 10*3/uL (ref 0.1–1.0)
Monocytes Relative: 11 %
Neutro Abs: 2.3 10*3/uL (ref 1.7–7.7)
Neutrophils Relative %: 60 %
Platelet Count: 141 10*3/uL — ABNORMAL LOW (ref 150–400)
RBC: 4.68 MIL/uL (ref 4.22–5.81)
RDW: 13.3 % (ref 11.5–15.5)
WBC Count: 3.7 10*3/uL — ABNORMAL LOW (ref 4.0–10.5)
nRBC: 0 % (ref 0.0–0.2)

## 2023-04-29 LAB — CMP (CANCER CENTER ONLY)
ALT: 15 U/L (ref 0–44)
AST: 14 U/L — ABNORMAL LOW (ref 15–41)
Albumin: 4.2 g/dL (ref 3.5–5.0)
Alkaline Phosphatase: 61 U/L (ref 38–126)
Anion gap: 5 (ref 5–15)
BUN: 16 mg/dL (ref 8–23)
CO2: 31 mmol/L (ref 22–32)
Calcium: 9.3 mg/dL (ref 8.9–10.3)
Chloride: 104 mmol/L (ref 98–111)
Creatinine: 0.97 mg/dL (ref 0.61–1.24)
GFR, Estimated: >60 mL/min (ref >60)
Glucose, Bld: 175 mg/dL — ABNORMAL HIGH (ref 70–99)
Potassium: 4 mmol/L (ref 3.5–5.1)
Sodium: 140 mmol/L (ref 135–145)
Total Bilirubin: 0.4 mg/dL (ref 0.0–1.2)
Total Protein: 7.2 g/dL (ref 6.5–8.1)

## 2023-04-29 LAB — RETIC PANEL
Immature Retic Fract: 11.7 % (ref 2.3–15.9)
RBC.: 4.69 MIL/uL (ref 4.22–5.81)
Retic Count, Absolute: 74.6 10*3/uL (ref 19.0–186.0)
Retic Ct Pct: 1.6 % (ref 0.4–3.1)
Reticulocyte Hemoglobin: 32.3 pg (ref >27.9)

## 2023-04-29 LAB — IRON AND IRON BINDING CAPACITY (CC-WL,HP ONLY)
Iron: 90 ug/dL (ref 45–182)
Saturation Ratios: 22 % (ref 17.9–39.5)
TIBC: 406 ug/dL (ref 250–450)
UIBC: 316 ug/dL (ref 117–376)

## 2023-04-29 LAB — FERRITIN: Ferritin: 9 ng/mL — ABNORMAL LOW (ref 24–336)

## 2023-04-29 MED ORDER — FERROUS SULFATE 325 (65 FE) MG PO TABS: 325.0000 mg | ORAL_TABLET | Freq: Every day | ORAL | 3 refills | Status: DC

## 2023-04-29 NOTE — Progress Notes (Signed)
 [facility] [phone]   [phone]  INITIAL CONSULT NOTE  Patient Care Team: Shade Flood, MD as PCP - General (Family Medicine)  Hematological/Oncological History # Iron Deficiency Anemia of Unclear Etiology 01/14/2015: WBC 5.0, Hgb 13.8, MCV 84.7, Plt 199 01/28/2023: WBC 4.1, Hgb 12.8, MCV 83.5, Plt 145.  03/04/2023: ferritin 11, Hgb 12.5  04/29/2023: establish care with Dr. Leonides Schanz   CHIEF COMPLAINTS/PURPOSE OF CONSULTATION:  "Iron Deficiency Anemia of Unclear Etiology "  HISTORY OF PRESENTING ILLNESS:  Ian Salazar 63 y.o. male with medical history significant for type 2 diabetes, hyperlipidemia, hypertension, GERD who presents for evaluation of iron deficiency anemia of unclear etiology.  On review of the previous records Ian Salazar has had anemia since at least 01/28/2023.  His last normal labs on record were from 01/14/2015 understanding of hemoglobin of 13.8.  Most recent iron labs on 03/04/2023 for the patient had a ferritin of 11 with a hemoglobin 12.5.  Due to concern for these findings the patient was referred to hematology for further evaluation and management.  On exam today Ian Salazar reports he is here today because having low iron.  He notes he has not had any trouble with bleeding, bruising, or dark stools.  He reports that his energy levels are generally pretty good.  He reports that he is eating well and has no dietary restrictions.  He does eat red meat, but prefers chicken and fish to help with his wife's diet.  He does not currently take any iron pills or multivitamins.  He reports that he has never undergone any surgeries of any kind.  He did undergo colonoscopy and endoscopy back in 2023 with no clear etiology found for his iron deficiency.  He notes he has donated blood before in the past and for the last 2 years he has been declined due to his low levels.  He reports he would donate about once per year.  On further  discussion he reports his mother passed away of lung cancer and his father died of Alzheimer's.  He reports his brother had pancreatic cancer but was negative for the "genetics".  He reports that he has no grandchildren and 1 healthy son.  He notes that he is had no alcohol in the last 6 years but is a former smoker having quit after 47 years at the end of January 2025.  He currently works at the [employer].  Otherwise he feels well and denies any runny nose, sore throat, or cough.  He denies any lightheadedness, dizziness, shortness of breath.  Full 10 point ROS is otherwise negative.  MEDICAL HISTORY:  Past Medical History:  Diagnosis Date   Abnormal EKG    Normal ETT 5 years ago   Anemia    Family history of pancreatic cancer    GERD (gastroesophageal reflux disease)    occasional   HTN (hypertension)    Hx of adenomatous colonic polyps 03/28/2015   Hyperlipidemia    Knee pain, right    Rotator cuff tendonitis    Left   Type II or unspecified type diabetes mellitus without mention of complication, not stated as uncontrolled     SURGICAL HISTORY: Past Surgical History:  Procedure Laterality Date   COLONOSCOPY W/ POLYPECTOMY  03/25/2015   MULTIPLE TOOTH EXTRACTIONS     POLYPECTOMY     UPPER GASTROINTESTINAL ENDOSCOPY  10/17/2019    SOCIAL HISTORY: Social History   Socioeconomic History   Marital status: Married  Spouse name: Not on file   Number of children: 1   Years of education: Not on file   Highest education level: 12th grade  Occupational History   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    Education: [employer].   Cigarette smoker, no drug use no alcohol since [age]      Social Drivers of Corporate investment banker Strain: Low Risk  (03/04/2023)   Overall Financial Resource Strain (CARDIA)    Difficulty of Paying Living Expenses: Not hard at all  Food Insecurity: No Food Insecurity (03/04/2023)   Hunger Vital Sign    Worried About Running Out of Food in the Last Year: Never true    Ran Out of Food in the Last Year: Never true  Transportation Needs: No Transportation Needs (03/04/2023)   PRAPARE - Administrator, Civil Service (Medical): No    Lack of Transportation (Non-Medical): No  Physical Activity: Insufficiently Active (03/04/2023)   Exercise Vital Sign    Days of Exercise per Week: 3 days    Minutes of Exercise per Session: 30 min  Stress: No Stress Concern Present (03/04/2023)   [employer] of Occupational Health - Occupational Stress Questionnaire    Feeling of Stress : Not at all  Social Connections: Socially Integrated (03/04/2023)   Social Connection and Isolation Panel [NHANES]    Frequency of Communication with Friends and Family: Three times a week    Frequency of Social Gatherings with Friends and Family: Once a week    Attends Religious Services: More than 4 times per year    Active Member of [employer] or Organizations: Yes    Attends Engineer, structural: More than 4 times per year    Marital Status: Married  Catering manager Violence: Unknown (05/27/2021)   Received from [facility]   HITS    Physically Hurt: Not on file    Insult or Talk Down To: Not on file    Threaten Physical Harm: Not on file    Scream or Curse: Not on file    FAMILY HISTORY: Family History  Problem Relation Age  of Onset   Hypertension Mother    Lung cancer Mother 27       smoker   Diabetes Father    Dementia Father    Heart disease Sister    Diabetes Sister    Diabetes Sister    Diabetes Brother    Pancreatic cancer Brother 60   Leukemia Maternal Aunt    Diabetes Maternal Grandmother    Diabetes Other        1st degree relative   Alcohol abuse Neg Hx    Stroke Neg Hx    Hyperlipidemia Neg Hx    Colon cancer Neg Hx  Colon polyps Neg Hx    Crohn's disease Neg Hx    Esophageal cancer Neg Hx    Rectal cancer Neg Hx    Stomach cancer Neg Hx     ALLERGIES:  has no known allergies.  MEDICATIONS:  Current Outpatient Medications  Medication Sig Dispense Refill   ferrous sulfate 325 (65 FE) MG tablet Take 1 tablet (325 mg total) by mouth daily with breakfast. Please take with a source of Vitamin C 90 tablet 3   atorvastatin (LIPITOR) 20 MG tablet TAKE 1 TABLET(20 MG) BY MOUTH DAILY 90 tablet 2   blood glucose meter kit and supplies KIT Dispense based on patient and insurance preference. Check your fasting blood sugars daily. (FOR ICD-9 250.00, 250.01). 1 each 0   glucose blood (ONETOUCH ULTRA) test strip USE TO TEST FASTING BLOOD SUGARS DAILY 100 strip 0   lisinopril (ZESTRIL) 10 MG tablet TAKE 1 TABLET(10 MG) BY MOUTH DAILY 90 tablet 2   metFORMIN (GLUCOPHAGE) 1000 MG tablet TAKE 1 TABLET(1000 MG) BY MOUTH TWICE DAILY WITH A MEAL 180 tablet 2   omeprazole (PRILOSEC) 20 MG capsule TAKE 1 CAPSULE(20 MG) BY MOUTH DAILY 90 capsule 2   ONETOUCH DELICA LANCETS 33G MISC USE TO TEST FASTING BLOOD SUGARS DAILY 100 each 11   No current facility-administered medications for this visit.    REVIEW OF SYSTEMS:   Constitutional: ( - ) fevers, ( - )  chills , ( - ) night sweats Eyes: ( - ) blurriness of vision, ( - ) double vision, ( - ) watery eyes Ears, nose, mouth, throat, and face: ( - ) mucositis, ( - ) sore throat Respiratory: ( - ) cough, ( - ) dyspnea, ( - ) wheezes Cardiovascular: ( - )  palpitation, ( - ) chest discomfort, ( - ) lower extremity swelling Gastrointestinal:  ( - ) nausea, ( - ) heartburn, ( - ) change in bowel habits Skin: ( - ) abnormal skin rashes Lymphatics: ( - ) new lymphadenopathy, ( - ) easy bruising Neurological: ( - ) numbness, ( - ) tingling, ( - ) new weaknesses Behavioral/Psych: ( - ) mood change, ( - ) new changes  All other systems were reviewed with the patient and are negative.  PHYSICAL EXAMINATION:  Vitals:   04/29/23 0925  BP: 136/72  Pulse: 71  Resp: 14  Temp: 98.2 F (36.8 C)  SpO2: 100%   Filed Weights   04/29/23 0925  Weight: 214 lb 12.8 oz (97.4 kg)    GENERAL: well appearing middle-aged African-American male in NAD  SKIN: skin color, texture, turgor are normal, no rashes or significant lesions EYES: conjunctiva are pink and non-injected, sclera clear LUNGS: clear to auscultation and percussion with normal breathing effort HEART: regular rate & rhythm and no murmurs and no lower extremity edema Musculoskeletal: no cyanosis of digits and no clubbing  PSYCH: alert & oriented x 3, fluent speech NEURO: no focal motor/sensory deficits  LABORATORY DATA:  I have reviewed the data as listed    Latest Ref Rng & Units 04/29/2023    9:59 AM 03/04/2023    2:27 PM 02/19/2023    3:54 PM  CBC  WBC 4.0 - 10.5 K/uL 3.7  4.7  4.4   Hemoglobin 13.0 - 17.0 g/dL 40.9  81.1  91.4   Hematocrit 39.0 - 52.0 % 39.9  39.2  37.6   Platelets 150 - 400 K/uL 141  173.0  133.0  Latest Ref Rng & Units 04/29/2023    9:59 AM 04/28/2023    9:41 AM 05/29/2021   10:17 AM  CMP  Glucose 70 - 99 mg/dL 409  811  914   BUN 8 - 23 mg/dL 16  14  11    Creatinine 0.61 - 1.24 mg/dL 7.82  9.56  2.13   Sodium 135 - 145 mmol/L 140  140  139   Potassium 3.5 - 5.1 mmol/L 4.0  4.2  3.8   Chloride 98 - 111 mmol/L 104  101  103   CO2 22 - 32 mmol/L 31  32  28   Calcium 8.9 - 10.3 mg/dL 9.3  9.6  9.3   Total Protein 6.5 - 8.1 g/dL 7.2  7.5  7.1   Total  Bilirubin 0.0 - 1.2 mg/dL 0.4  0.4  0.4   Alkaline Phos 38 - 126 U/L 61  59  61   AST 15 - 41 U/L 14  17  16    ALT 0 - 44 U/L 15  15  14       ASSESSMENT & PLAN JACKSTON OAXACA 63 y.o. male with medical history significant for type 2 diabetes, hyperlipidemia, hypertension, GERD who presents for evaluation of iron deficiency anemia of unclear etiology.  After review of the labs, review of the records, and discussion with the patient the patients findings are most consistent with iron deficiency anemia of unclear etiology, but do suspect GI source.  # Iron Deficiency Anemia 2/2 to GI Bleeding (Suspected) -- Findings are consistent with iron deficiency anemia secondary to GI bleeding.  --continue follow-up with GI for evaluation.  Patient last underwent EGD and colonoscopy in May 2023. --If patient is refractory to p.o. iron therapy or if his levels continue to drop we will reach out to gastroenterology for consideration of capsule endoscopy --We will confirm iron deficiency anemia by ordering iron panel and ferritin as well as reticulocytes, CBC, and CMP --start ferrous sulfate 325 mg daily with a source of vitamin C --We will plan to proceed with IV iron therapy in order to help bolster the patient's blood counts if p.o. iron is inadequate. --Plan for return to clinic in 3 months to reevaluate   Orders Placed This Encounter  Procedures   CBC with Differential (Cancer Center Only)    Standing Status:   Future    Number of Occurrences:   1    Expiration Date:   04/28/2024   CMP (Cancer Center only)    Standing Status:   Future    Number of Occurrences:   1    Expiration Date:   04/28/2024   Ferritin    Standing Status:   Future    Number of Occurrences:   1    Expiration Date:   04/28/2024   Iron and Iron Binding Capacity ([facility] only)    Standing Status:   Future    Number of Occurrences:   1    Expiration Date:   04/28/2024   Retic Panel    Standing Status:   Future    Number  of Occurrences:   1    Expiration Date:   04/28/2024    All questions were answered. The patient knows to call the clinic with any problems, questions or concerns.  A total of more than 60 minutes were spent on this encounter with face-to-face time and non-face-to-face time, including preparing to see the patient, ordering tests and/or medications, counseling the patient and coordination of  care as outlined above.   Ulysees Barns, MD Department of Hematology/Oncology [facility] Phone: [phone] Pager: [phone] Email: [email or web address]  04/29/2023 4:31 PM

## 2023-05-13 ENCOUNTER — Other Ambulatory Visit: Payer: Self-pay | Admitting: Family Medicine

## 2023-05-13 DIAGNOSIS — K219 Gastro-esophageal reflux disease without esophagitis: Secondary | ICD-10-CM

## 2023-05-13 NOTE — Telephone Encounter (Signed)
 Please advise if you would like to fill this medication

## 2023-05-13 NOTE — Telephone Encounter (Signed)
 Copied from CRM [id number]. Topic: Clinical - Medication Refill >> May 13, 2023 10:19 AM Pascal Lux wrote: Most Recent Primary Care Visit:  Provider: Meredith Staggers R  Department: [facility]  Visit Type: OFFICE VISIT  Date: 04/28/2023  Medication: ferrous sulfate 325 (65 FE) MG tablet [[id number]]  Has the patient contacted their pharmacy? Yes (Agent: If no, request that the patient contact the pharmacy for the refill. If patient does not wish to contact the pharmacy document the reason why and proceed with request.) (Agent: If yes, when and what did the pharmacy advise?) Pharmacist said they do not have the prescription.  Is this the correct pharmacy for this prescription? Yes If no, delete pharmacy and type the correct one.  This is the patient's preferred pharmacy:  [id number] [place], [place] - [phone] AT [facility] OF [facility] & [address] [place] [address] Phone: [phone] Fax: [phone]   Has the prescription been filled recently? Yes  Is the patient out of the medication? Yes  Has the patient been seen for an appointment in the last year OR does the patient have an upcoming appointment? Yes  Can we respond through MyChart? No  Agent: Please be advised that Rx refills may take up to 3 business days. We ask that you follow-up with your pharmacy.

## 2023-05-13 NOTE — Telephone Encounter (Signed)
 Cancelled reorder, medication is prescribed by Oncologist Dr Leonides Schanz. Called and confirmed with [facility] they have not received the prescription. Patient needs to follow up with his oncologist regarding the medication. Closing encounter.

## 2023-05-13 NOTE — Telephone Encounter (Signed)
 Copied from CRM [id number]. Topic: Clinical - Medication Refill >> May 13, 2023 10:21 AM Pascal Lux wrote: Most Recent Primary Care Visit:  Provider: Meredith Staggers R  Department: [facility]  Visit Type: OFFICE VISIT  Date: 04/28/2023  Medication: omeprazole (PRILOSEC) 20 MG capsule [[id number]]  Has the patient contacted their pharmacy? No (Agent: If no, request that the patient contact the pharmacy for the refill. If patient does not wish to contact the pharmacy document the reason why and proceed with request.) (Agent: If yes, when and what did the pharmacy advise?)  Is this the correct pharmacy for this prescription? Yes If no, delete pharmacy and type the correct one.  This is the patient's preferred pharmacy:  [facility] #[id number] [place], [place] - [phone] AT [facility] OF [facility] & [address] [place] [address] Phone: [phone] Fax: [phone]   Has the prescription been filled recently? No  Is the patient out of the medication? Yes  Has the patient been seen for an appointment in the last year OR does the patient have an upcoming appointment? Yes  Can we respond through MyChart? No  Agent: Please be advised that Rx refills may take up to 3 business days. We ask that you follow-up with your pharmacy.

## 2023-05-14 ENCOUNTER — Other Ambulatory Visit: Payer: Self-pay | Admitting: *Deleted

## 2023-05-14 DIAGNOSIS — D500 Iron deficiency anemia secondary to blood loss (chronic): Secondary | ICD-10-CM

## 2023-05-14 MED ORDER — OMEPRAZOLE 20 MG PO CPDR: 20.0000 mg | DELAYED_RELEASE_CAPSULE | Freq: Every day | ORAL | 2 refills | Status: DC

## 2023-05-14 MED ORDER — FERROUS SULFATE 325 (65 FE) MG PO TBEC
325.0000 mg | DELAYED_RELEASE_TABLET | Freq: Every day | ORAL | 3 refills | Status: DC
Start: 2023-05-14 — End: 2023-08-19

## 2023-05-14 NOTE — Telephone Encounter (Signed)
 Omeprazole discussed at his March 26 visit, refill ordered.

## 2023-05-24 ENCOUNTER — Other Ambulatory Visit: Payer: Self-pay | Admitting: Acute Care

## 2023-05-24 DIAGNOSIS — Z87891 Personal history of nicotine dependence: Secondary | ICD-10-CM

## 2023-05-24 DIAGNOSIS — Z122 Encounter for screening for malignant neoplasm of respiratory organs: Secondary | ICD-10-CM

## 2023-07-29 ENCOUNTER — Ambulatory Visit: Payer: PRIVATE HEALTH INSURANCE | Admitting: Family Medicine

## 2023-07-30 ENCOUNTER — Other Ambulatory Visit: Payer: Self-pay | Admitting: Hematology and Oncology

## 2023-07-30 ENCOUNTER — Inpatient Hospital Stay: Payer: PRIVATE HEALTH INSURANCE | Attending: Hematology and Oncology

## 2023-07-30 DIAGNOSIS — D500 Iron deficiency anemia secondary to blood loss (chronic): Secondary | ICD-10-CM

## 2023-07-30 DIAGNOSIS — Z800 Family history of malignant neoplasm of digestive organs: Secondary | ICD-10-CM | POA: Diagnosis not present

## 2023-07-30 DIAGNOSIS — F17210 Nicotine dependence, cigarettes, uncomplicated: Secondary | ICD-10-CM | POA: Diagnosis not present

## 2023-07-30 DIAGNOSIS — D509 Iron deficiency anemia, unspecified: Secondary | ICD-10-CM | POA: Diagnosis present

## 2023-07-30 DIAGNOSIS — Z801 Family history of malignant neoplasm of trachea, bronchus and lung: Secondary | ICD-10-CM | POA: Diagnosis not present

## 2023-07-30 LAB — RETIC PANEL
Immature Retic Fract: 11 % (ref 2.3–15.9)
RBC.: 4.59 MIL/uL (ref 4.22–5.81)
Retic Count, Absolute: 70.2 10*3/uL (ref 19.0–186.0)
Retic Ct Pct: 1.5 % (ref 0.4–3.1)
Reticulocyte Hemoglobin: 31.5 pg (ref >27.9)

## 2023-07-30 LAB — FERRITIN: Ferritin: 35 ng/mL (ref 24–336)

## 2023-07-30 LAB — CBC WITH DIFFERENTIAL (CANCER CENTER ONLY)
Abs Immature Granulocytes: 0.01 10*3/uL (ref 0.00–0.07)
Basophils Absolute: 0 10*3/uL (ref 0.0–0.1)
Basophils Relative: 0 %
Eosinophils Absolute: 0.1 10*3/uL (ref 0.0–0.5)
Eosinophils Relative: 1 %
HCT: 39.4 % (ref 39.0–52.0)
Hemoglobin: 12.7 g/dL — ABNORMAL LOW (ref 13.0–17.0)
Immature Granulocytes: 0 %
Lymphocytes Relative: 19 %
Lymphs Abs: 0.9 10*3/uL (ref 0.7–4.0)
MCH: 27.8 pg (ref 26.0–34.0)
MCHC: 32.2 g/dL (ref 30.0–36.0)
MCV: 86.2 fL (ref 80.0–100.0)
Monocytes Absolute: 0.4 10*3/uL (ref 0.1–1.0)
Monocytes Relative: 9 %
Neutro Abs: 3.1 10*3/uL (ref 1.7–7.7)
Neutrophils Relative %: 71 %
Platelet Count: 134 10*3/uL — ABNORMAL LOW (ref 150–400)
RBC: 4.57 MIL/uL (ref 4.22–5.81)
RDW: 13.2 % (ref 11.5–15.5)
WBC Count: 4.4 10*3/uL (ref 4.0–10.5)
nRBC: 0 % (ref 0.0–0.2)

## 2023-07-30 LAB — CMP (CANCER CENTER ONLY)
ALT: 13 U/L (ref 0–44)
AST: 15 U/L (ref 15–41)
Albumin: 4.2 g/dL (ref 3.5–5.0)
Alkaline Phosphatase: 60 U/L (ref 38–126)
Anion gap: 6 (ref 5–15)
BUN: 12 mg/dL (ref 8–23)
CO2: 26 mmol/L (ref 22–32)
Calcium: 9.1 mg/dL (ref 8.9–10.3)
Chloride: 108 mmol/L (ref 98–111)
Creatinine: 0.78 mg/dL (ref 0.61–1.24)
GFR, Estimated: >60 mL/min (ref >60)
Glucose, Bld: 158 mg/dL — ABNORMAL HIGH (ref 70–99)
Potassium: 3.9 mmol/L (ref 3.5–5.1)
Sodium: 140 mmol/L (ref 135–145)
Total Bilirubin: 0.3 mg/dL (ref 0.0–1.2)
Total Protein: 7 g/dL (ref 6.5–8.1)

## 2023-07-30 LAB — IRON AND IRON BINDING CAPACITY (CC-WL,HP ONLY)
Iron: 54 ug/dL (ref 45–182)
Saturation Ratios: 16 % — ABNORMAL LOW (ref 17.9–39.5)
TIBC: 335 ug/dL (ref 250–450)
UIBC: 281 ug/dL (ref 117–376)

## 2023-08-10 NOTE — Progress Notes (Unsigned)
 [facility] Telephone:([phone]   [phone]) [phone]  PROGRESS NOTE  Patient Care Team: Levora Reyes SAUNDERS, MD as PCP - General (Family Medicine)  Hematological/Oncological History # Iron Deficiency Anemia of Unclear Etiology 01/14/2015: WBC 5.0, Hgb 13.8, MCV 84.7, Plt 199 01/28/2023: WBC 4.1, Hgb 12.8, MCV 83.5, Plt 145.  03/04/2023: ferritin 11, Hgb 12.5  04/29/2023: establish care with Dr. Federico   Interval History:  Ian Salazar 63 y.o. male with medical history significant for iron deficiency anemia 2/2 to unclear etiology who presents for a follow up visit. The patient's last visit was on 04/29/2023 at which time he established care. In the interim since the last visit he has had no major changes in his health.  On exam today Ian Salazar reports that over the last 3 months he has been well with no major changes in his health.  He reports his energy levels are pretty good and he currently ranks them as a 7 out of 10.  He reports that he does slow him down.  He is not having any lightheadedness, dizziness, or shortness of breath.  He denies any bleeding or bruising.  He reports that he does have some occasional dark bowel movements but thinks this is secondary to his iron pills.  He reports that sometimes the stool is greenish.  He notes he is not having any other nausea, vomiting, or diarrhea.  He denies any overt sources of bleeding such as blood in the urine, blood in the stool, or bleeding elsewhere.  He reports he eats red meat approximately twice per week.  He has had no other changes in his health in the interim since our last visit.  A full 10 point ROS is otherwise negative.  MEDICAL HISTORY:  Past Medical History:  Diagnosis Date   Abnormal EKG    Normal ETT 5 years ago   Anemia    Family history of pancreatic cancer    GERD (gastroesophageal reflux disease)    occasional   HTN (hypertension)    Hx of adenomatous colonic polyps 03/28/2015    Hyperlipidemia    Knee pain, right    Rotator cuff tendonitis    Left   Type II or unspecified type diabetes mellitus without mention of complication, not stated as uncontrolled     SURGICAL HISTORY: Past Surgical History:  Procedure Laterality Date   COLONOSCOPY W/ POLYPECTOMY  03/25/2015   MULTIPLE TOOTH EXTRACTIONS     POLYPECTOMY     UPPER GASTROINTESTINAL ENDOSCOPY  10/17/2019    SOCIAL HISTORY: Social History   Socioeconomic History   Marital status: Married    Spouse name: Not on file   Number of children: 1   Years of education: Not on file   Highest education level: 12th grade  Occupational History   Occupation: Paper [employer]  Tobacco Use   Smoking status: Every Day    Current packs/day: 0.75    Average packs/day: 0.8 packs/day for 48.0 years (36.0 ttl pk-yrs)    Types: Cigarettes   Smokeless tobacco: Never  Vaping Use   Vaping status: Never Used  Substance and Sexual Activity   Alcohol use: Not Currently    Comment: 09/07/19-none in 2 1/2 years   Drug use: No   Sexual activity: Yes  Other Topics Concern   Not on file  Social History Narrative   Married.  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    Education: [employer].   Cigarette smoker, no drug use no alcohol since [age]      Social Drivers of Corporate investment banker Strain: Low Risk  (03/04/2023)   Overall Financial Resource Strain (CARDIA)    Difficulty of Paying Living Expenses: Not hard at all  Food Insecurity: No Food Insecurity (03/04/2023)   Hunger Vital Sign    Worried About Running Out of Food in the Last Year: Never true    Ran Out of Food in the Last Year: Never true  Transportation Needs: No Transportation Needs (03/04/2023)   PRAPARE - Administrator, Civil Service (Medical): No    Lack of Transportation (Non-Medical): No  Physical  Activity: Insufficiently Active (03/04/2023)   Exercise Vital Sign    Days of Exercise per Week: 3 days    Minutes of Exercise per Session: 30 min  Stress: No Stress Concern Present (03/04/2023)   [employer] of Occupational Health - Occupational Stress Questionnaire    Feeling of Stress : Not at all  Social Connections: Socially Integrated (03/04/2023)   Social Connection and Isolation Panel    Frequency of Communication with Friends and Family: Three times a week    Frequency of Social Gatherings with Friends and Family: Once a week    Attends Religious Services: More than 4 times per year    Active Member of [employer] or Organizations: Yes    Attends Engineer, structural: More than 4 times per year    Marital Status: Married  Catering manager Violence: Unknown (05/27/2021)   Received from [facility]   HITS    Physically Hurt: Not on file    Insult or Talk Down To: Not on file    Threaten Physical Harm: Not on file    Scream or Curse: Not on file    FAMILY HISTORY: Family History  Problem Relation Age of Onset   Hypertension Mother    Lung cancer Mother 24       smoker   Diabetes Father    Dementia Father    Heart disease Sister    Diabetes Sister    Diabetes Sister    Diabetes Brother    Pancreatic cancer Brother 28   Leukemia Maternal Aunt    Diabetes Maternal Grandmother    Diabetes Other        1st degree relative   Alcohol abuse Neg Hx    Stroke Neg Hx    Hyperlipidemia Neg Hx    Colon cancer Neg Hx    Colon polyps Neg Hx    Crohn's disease Neg Hx    Esophageal cancer Neg Hx    Rectal cancer Neg Hx    Stomach cancer Neg Hx     ALLERGIES:  has no known allergies.  MEDICATIONS:  Current Outpatient Medications  Medication Sig Dispense Refill   atorvastatin  (LIPITOR) 20 MG tablet TAKE 1 TABLET(20 MG) BY MOUTH DAILY 90 tablet 2   blood glucose meter kit and supplies KIT Dispense based on patient and insurance preference. Check your fasting blood  sugars daily. (FOR ICD-9 250.00, 250.01). 1 each 0   ferrous sulfate  325 (65 FE) MG EC tablet Take 1 tablet (325 mg total) by mouth daily. Take with source of Vitamin C 90 tablet 3   glucose blood (ONETOUCH ULTRA) test strip USE TO TEST FASTING BLOOD SUGARS DAILY 100 strip 0  lisinopril  (ZESTRIL ) 10 MG tablet TAKE 1 TABLET(10 MG) BY MOUTH DAILY 90 tablet 2   metFORMIN  (GLUCOPHAGE ) 1000 MG tablet TAKE 1 TABLET(1000 MG) BY MOUTH TWICE DAILY WITH A MEAL 180 tablet 2   omeprazole  (PRILOSEC) 20 MG capsule Take 1 capsule (20 mg total) by mouth daily. 90 capsule 2   ONETOUCH DELICA LANCETS 33G MISC USE TO TEST FASTING BLOOD SUGARS DAILY 100 each 11   No current facility-administered medications for this visit.    REVIEW OF SYSTEMS:   Constitutional: ( - ) fevers, ( - )  chills , ( - ) night sweats Eyes: ( - ) blurriness of vision, ( - ) double vision, ( - ) watery eyes Ears, nose, mouth, throat, and face: ( - ) mucositis, ( - ) sore throat Respiratory: ( - ) cough, ( - ) dyspnea, ( - ) wheezes Cardiovascular: ( - ) palpitation, ( - ) chest discomfort, ( - ) lower extremity swelling Gastrointestinal:  ( - ) nausea, ( - ) heartburn, ( - ) change in bowel habits Skin: ( - ) abnormal skin rashes Lymphatics: ( - ) new lymphadenopathy, ( - ) easy bruising Neurological: ( - ) numbness, ( - ) tingling, ( - ) new weaknesses Behavioral/Psych: ( - ) mood change, ( - ) new changes  All other systems were reviewed with the patient and are negative.  PHYSICAL EXAMINATION:  Vitals:   08/11/23 1038  BP: 108/67  Pulse: 78  Resp: 16  Temp: 97.8 F (36.6 C)  SpO2: 96%   Filed Weights   08/11/23 1038  Weight: 206 lb 14.4 oz (93.8 kg)    GENERAL: Well-appearing middle-aged African-American male, alert, no distress and comfortable SKIN: skin color, texture, turgor are normal, no rashes or significant lesions EYES: conjunctiva are pink and non-injected, sclera clear OROPHARYNX: no exudate, no erythema;  lips, buccal mucosa, and tongue normal  NECK: supple, non-tender LYMPH:  no palpable lymphadenopathy in the cervical, axillary or inguinal LUNGS: clear to auscultation and percussion with normal breathing effort HEART: regular rate & rhythm and no murmurs and no lower extremity edema ABDOMEN: soft, non-tender, non-distended, normal bowel sounds Musculoskeletal: no cyanosis of digits and no clubbing  PSYCH: alert & oriented x 3, fluent speech NEURO: no focal motor/sensory deficits  LABORATORY DATA:  I have reviewed the data as listed    Latest Ref Rng & Units 07/30/2023    9:54 AM 04/29/2023    9:59 AM 03/04/2023    2:27 PM  CBC  WBC 4.0 - 10.5 K/uL 4.4  3.7  4.7   Hemoglobin 13.0 - 17.0 g/dL 87.2  87.3  87.4   Hematocrit 39.0 - 52.0 % 39.4  39.9  39.2   Platelets 150 - 400 K/uL 134  141  173.0        Latest Ref Rng & Units 07/30/2023    9:54 AM 04/29/2023    9:59 AM 04/28/2023    9:41 AM  CMP  Glucose 70 - 99 mg/dL 841  824  828   BUN 8 - 23 mg/dL 12  16  14    Creatinine 0.61 - 1.24 mg/dL 9.21  9.02  9.09   Sodium 135 - 145 mmol/L 140  140  140   Potassium 3.5 - 5.1 mmol/L 3.9  4.0  4.2   Chloride 98 - 111 mmol/L 108  104  101   CO2 22 - 32 mmol/L 26  31  32   Calcium  8.9 - 10.3  mg/dL 9.1  9.3  9.6   Total Protein 6.5 - 8.1 g/dL 7.0  7.2  7.5   Total Bilirubin 0.0 - 1.2 mg/dL 0.3  0.4  0.4   Alkaline Phos 38 - 126 U/L 60  61  59   AST 15 - 41 U/L 15  14  17    ALT 0 - 44 U/L 13  15  15      RADIOGRAPHIC STUDIES: No results found.  ASSESSMENT & PLAN Ian Salazar 63 y.o. male with medical history significant for iron deficiency anemia 2/2 to unclear etiology who presents for a follow up visit.   # Iron Deficiency Anemia 2/2 to GI Bleeding (Suspected) -- Findings are consistent with iron deficiency anemia secondary to GI bleeding.  --continue follow-up with GI for evaluation.  Patient last underwent EGD and colonoscopy in May 2023. --If patient is refractory to p.o.  iron therapy or if his levels continue to drop we will reach out to gastroenterology for consideration of capsule endoscopy --We will confirm iron deficiency anemia by ordering iron panel and ferritin as well as reticulocytes, CBC, and CMP --start ferrous sulfate  325 mg daily with a source of vitamin C --Labs today show white blood cell 4.4, hemoglobin 12.7, MCV 86.2, platelets 134.  Ferritin increased to 35 with iron sat of 16%. --We will plan to proceed with IV iron therapy in order to help bolster the patient's blood counts if p.o. iron is inadequate. --Plan for return to clinic in 3 months to reevaluate  No orders of the defined types were placed in this encounter.   All questions were answered. The patient knows to call the clinic with any problems, questions or concerns.  A total of more than 30 minutes were spent on this encounter with face-to-face time and non-face-to-face time, including preparing to see the patient, ordering tests and/or medications, counseling the patient and coordination of care as outlined above.   Norleen IVAR Kidney, MD Department of Hematology/Oncology [facility] at [facility] Phone: [phone] Pager: [phone] Email: [email or web address]  08/11/2023 8:13 PM

## 2023-08-11 ENCOUNTER — Inpatient Hospital Stay: Payer: PRIVATE HEALTH INSURANCE | Attending: Hematology and Oncology | Admitting: Hematology and Oncology

## 2023-08-11 VITALS — BP 108/67 | HR 78 | Temp 97.8°F | Resp 16 | Ht 69.0 in | Wt 206.9 lb

## 2023-08-11 DIAGNOSIS — D500 Iron deficiency anemia secondary to blood loss (chronic): Secondary | ICD-10-CM

## 2023-08-11 DIAGNOSIS — D509 Iron deficiency anemia, unspecified: Secondary | ICD-10-CM | POA: Insufficient documentation

## 2023-08-11 DIAGNOSIS — F17210 Nicotine dependence, cigarettes, uncomplicated: Secondary | ICD-10-CM | POA: Diagnosis not present

## 2023-08-11 DIAGNOSIS — Z860101 Personal history of adenomatous and serrated colon polyps: Secondary | ICD-10-CM | POA: Insufficient documentation

## 2023-08-11 DIAGNOSIS — Z801 Family history of malignant neoplasm of trachea, bronchus and lung: Secondary | ICD-10-CM | POA: Insufficient documentation

## 2023-08-11 LAB — HM DIABETES EYE EXAM

## 2023-08-19 ENCOUNTER — Ambulatory Visit (INDEPENDENT_AMBULATORY_CARE_PROVIDER_SITE_OTHER): Payer: PRIVATE HEALTH INSURANCE | Admitting: Family Medicine

## 2023-08-19 VITALS — BP 108/58 | HR 87 | Temp 98.2°F | Ht 69.0 in | Wt 205.2 lb

## 2023-08-19 DIAGNOSIS — Z7984 Long term (current) use of oral hypoglycemic drugs: Secondary | ICD-10-CM

## 2023-08-19 DIAGNOSIS — M25562 Pain in left knee: Secondary | ICD-10-CM

## 2023-08-19 DIAGNOSIS — D500 Iron deficiency anemia secondary to blood loss (chronic): Secondary | ICD-10-CM | POA: Diagnosis not present

## 2023-08-19 DIAGNOSIS — E1165 Type 2 diabetes mellitus with hyperglycemia: Secondary | ICD-10-CM

## 2023-08-19 MED ORDER — FERROUS SULFATE 325 (65 FE) MG PO TBEC
325.0000 mg | DELAYED_RELEASE_TABLET | Freq: Every day | ORAL | 3 refills | Status: AC
Start: 2023-08-19 — End: ?

## 2023-08-19 NOTE — Patient Instructions (Addendum)
 Knee pain is likely a flare of arthritis.  Okay to use Voltaren gel over-the-counter for the next week or so.  If not improving let me know and I am happy to refer you to orthopedics.  Please have x-ray of that knee at the [facility] location below and if I see any concerns I will let you know.  I am checking labs today if the diabetes test is still elevated we can discuss other medications, continue metformin  for now.  Continue iron, I sent a refill, and follow-up with gastroenterology, other specialist as planned.  Let me know if there are questions.  [facility] or xray: Walk in 8:30-4:30 during weekdays, no appointment needed [address]  [place], [place] [address]

## 2023-08-19 NOTE — Progress Notes (Signed)
 Subjective:  Patient ID: Ian Salazar, male    DOB: 07/16/1960  Age: 63 y.o. MRN: [id number]  CC:  Chief Complaint  Patient presents with   Medical Management of Chronic Issues    Pt is here for 3 month med mgnt Pt states he has burning sensation in left knee (laying down) sx started 3 wks ago Pt is asking if he needs to continue iron supplement ?    HPI Ian Salazar presents for   Multiple concerns above and follow-up.  Left knee burning sensation Both sides, mainly on the inside. No locking/giving way. Sore at times with walking or lying down. No swelling, no giving way. Prior injections by ortho in that knee years ago. Sore at times past 3 weeks. No recent ortho eval.  XR 2010: minimal degenerative changes.  No known injury. Tx: voltaren topical, only started last night.    Iron supplementation Recent note of hematology on 08/11/2023, history of iron deficiency anemia, thought to be secondary to GI bleeding.  Plan for continued follow-up with GI for evaluation.  If refractory to p.o. iron therapy or if levels continue to drop, plan for hematology to reach out to GI for consideration of capsule endoscopy.  Iron testing including panel, ferritin, reticulocyte CBC and CMP were ordered.  Started ferrous sulfate  325 mg daily with a source of vitamin C.  Plan to proceed with IV iron therapy if p.o. iron was inadequate with recheck in 3 months to reevaluate.needs new Rx - called pharmacy - no refills? I will send in.   Diabetes: With hyperglycemia.  He had noted an A1c of 7.7 in December, previously had been elevated up to 8.9.  Metformin  1000 mg twice daily, plan on improved diet and exercise with minimal changes.  A1c still elevated at his March visit at 8.0.  He is on ACE inhibitor with lisinopril  and statin with Lipitor. With elevated A1c option of significant diet changes with recheck levels in 3 months versus addition of medication.  He is still taking just metformin  1000 mg  twice daily. Diet is about the same, trying to decrease portions.  No side effects with metformin  Home readings - 127-137. No 200's, no lows.   Microalbumin: 04/28/23.  Optho, foot exam, pneumovax:  Optho - last week. Requesting report.   Lab Results  Component Value Date   HGBA1C 8.0 (H) 04/28/2023   HGBA1C 8.9 (H) 05/13/2022   HGBA1C 7.5 (H) 05/29/2021   Lab Results  Component Value Date   MICROALBUR -0.1 (L) 04/28/2023   LDLCALC 57 04/28/2023   CREATININE 0.78 07/30/2023     History Patient Active Problem List   Diagnosis Date Noted   Body mass index (BMI) 30.0-30.9, adult 04/28/2023   Gastro-esophageal reflux disease without esophagitis 04/28/2023   Hyperlipidemia 04/28/2023   Family history of pancreatic cancer 12/09/2020   Pain of left heel 03/07/2020   Hx of adenomatous colonic polyps 03/28/2015   History of adenomatous polyp of colon 03/28/2015   Routine general medical examination at a health care facility 07/17/2011   Mixed hyperlipidemia 07/17/2011   Abnormal chest x-ray 07/17/2011   Vitamin B 12 deficiency 04/22/2010   ANEMIA-NOS 04/18/2010   Anemia 04/18/2010   DEGENERATIVE JOINT DISEASE, KNEE 10/24/2008   Osteoarthritis 10/24/2008   DM w/o complication type II 02/27/2008   Past Medical History:  Diagnosis Date   Abnormal EKG    Normal ETT 5 years ago   Anemia    Family history of pancreatic  cancer    GERD (gastroesophageal reflux disease)    occasional   HTN (hypertension)    Hx of adenomatous colonic polyps 03/28/2015   Hyperlipidemia    Knee pain, right    Rotator cuff tendonitis    Left   Type II or unspecified type diabetes mellitus without mention of complication, not stated as uncontrolled    Past Surgical History:  Procedure Laterality Date   COLONOSCOPY W/ POLYPECTOMY  03/25/2015   MULTIPLE TOOTH EXTRACTIONS     POLYPECTOMY     UPPER GASTROINTESTINAL ENDOSCOPY  10/17/2019   No Known Allergies Prior to Admission medications    Medication Sig Start Date End Date Taking? Authorizing Provider  atorvastatin  (LIPITOR) 20 MG tablet TAKE 1 TABLET(20 MG) BY MOUTH DAILY 02/10/23  Yes Ian Reyes SAUNDERS, MD  blood glucose meter kit and supplies KIT Dispense based on patient and insurance preference. Check your fasting blood sugars daily. (FOR ICD-9 250.00, 250.01). 12/12/16  Yes Christopher Savannah, PA-C  ferrous sulfate  325 (65 FE) MG EC tablet Take 1 tablet (325 mg total) by mouth daily. Take with source of Vitamin C 05/14/23  Yes Federico Norleen DASEN IV, MD  glucose blood (ONETOUCH ULTRA) test strip USE TO TEST FASTING BLOOD SUGARS DAILY 05/25/22  Yes Ian Reyes SAUNDERS, MD  lisinopril  (ZESTRIL ) 10 MG tablet TAKE 1 TABLET(10 MG) BY MOUTH DAILY 01/29/23  Yes Ian Reyes SAUNDERS, MD  metFORMIN  (GLUCOPHAGE ) 1000 MG tablet TAKE 1 TABLET(1000 MG) BY MOUTH TWICE DAILY WITH A MEAL 02/25/23  Yes Ian Reyes SAUNDERS, MD  omeprazole  (PRILOSEC) 20 MG capsule Take 1 capsule (20 mg total) by mouth daily. 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    Education: [employer].   Cigarette smoker, no drug use no alcohol since [age]-19      Social Drivers of Corporate investment banker Strain: Low Risk  (08/19/2023)   Overall Financial Resource Strain (CARDIA)    Difficulty of Paying Living Expenses: Not hard at all  Food Insecurity: No Food Insecurity (08/19/2023)   Hunger Vital Sign    Worried About Running Out of Food in the Last Year: Never true    Ran Out of Food in the Last Year: Never true  Transportation Needs: No Transportation Needs (08/19/2023)   PRAPARE - Administrator, Civil Service (Medical): No  Lack of Transportation (Non-Medical): No  Physical Activity: Insufficiently Active (08/19/2023)   Exercise Vital Sign    Days of Exercise per Week: 1 day    Minutes of Exercise per Session: 70 min  Stress: No Stress Concern Present (08/19/2023)   [employer] of Occupational Health - Occupational Stress Questionnaire    Feeling of Stress: Not at all  Social Connections: Socially Integrated (08/19/2023)   Social Connection and Isolation Panel    Frequency of Communication with Friends and Family: Three times a week    Frequency of Social Gatherings with Friends and Family: Once a week    Attends Religious Services: More than 4 times per year    Active Member of [employer] or Organizations: Yes    Attends Banker Meetings: More than 4 times per year    Marital Status: Married  Catering manager Violence: Unknown (05/27/2021)   Received from [facility]   HITS    Physically Hurt: Not on file    Insult or Talk Down To: Not on file    Threaten Physical Harm: Not on file    Scream or Curse: Not on file    Review of Systems  Constitutional:  Negative for fatigue and unexpected weight change.  Eyes:  Negative for visual disturbance.  Respiratory:  Negative for cough, chest tightness and shortness of breath.   Cardiovascular:  Negative for chest pain, palpitations and  leg swelling.  Gastrointestinal:  Negative for abdominal pain and blood in stool.  Neurological:  Negative for dizziness, light-headedness and headaches.     Objective:   Vitals:   08/19/23 1445  BP: (!) 108/58  Pulse: 87  Temp: 98.2 F (36.8 C)  SpO2: 98%  Weight: 205 lb 4 oz (93.1 kg)  Height: 5' 9 (1.753 m)     Physical Exam Vitals reviewed.  Constitutional:      Appearance: He is well-developed.  HENT:     Head: Normocephalic and atraumatic.  Neck:     Vascular: No carotid bruit or JVD.  Cardiovascular:     Rate and Rhythm: Normal rate and regular rhythm.     Heart sounds: Normal heart sounds. No murmur heard. Pulmonary:     Effort: Pulmonary effort is normal.     Breath sounds: Normal breath sounds. No rales.  Musculoskeletal:     Right lower leg: No edema.     Left lower leg: No edema.     Comments: Left knee, skin intact, no erythema, no apparent effusion.  Reproducible discomfort at the medial joint line greater than lateral joint line.  Medial joint line discomfort at terminal flexion, and with McMurray testing without clicks.  Discomfort with valgus testing at the medial joint line.  Skin:    General: Skin is warm and dry.  Neurological:     Mental Status: He is alert and oriented to person, place, and time.  Psychiatric:        Mood and Affect: Mood normal.        Assessment & Plan:  RYLYNN SCHONEMAN is a 63 y.o. male . Type 2 diabetes mellitus with hyperglycemia, without long-term current use of insulin  (HCC) - Plan: Comprehensive metabolic panel with GFR, Hemoglobin A1c  - Check labs, if elevated A1c we will consider addition of new med.  Home readings are reassuring recently.  Continue metformin  same dose for now.  Iron deficiency anemia due to chronic blood loss - Plan: ferrous sulfate  325 (65 FE) MG EC tablet  -  Continue iron, follow-up with gastroenterology, hematology as planned.  Should have refills on his iron supplement but I went ahead  and sent that in again.  Left medial knee pain - Plan: DG Knee Complete 4 Views Left  - No new injury, suspect component of degenerative meniscal disease versus arthritis of the knee joint.  Check updated imaging, consider Ortho eval if not improving with topical Voltaren in the next week or 2, he will let me know and I can send a referral at that time if needed.  Imaging was ordered to evaluate degree of arthritic change and to rule out loose body.  RTC precautions if worsening.  Meds ordered this encounter  Medications   ferrous sulfate  325 (65 FE) MG EC tablet    Sig: Take 1 tablet (325 mg total) by mouth daily. Take with source of Vitamin C    Dispense:  90 tablet    Refill:  3   Patient Instructions  Knee pain is likely a flare of arthritis.  Okay to use Voltaren gel over-the-counter for the next week or so.  If not improving let me know and I am happy to refer you to orthopedics.  Please have x-ray of that knee at the [facility] location below and if I see any concerns I will let you know.  I am checking labs today if the diabetes test is still elevated we can discuss other medications, continue metformin  for now.  Continue iron, I sent a refill, and follow-up with gastroenterology, other specialist as planned.  Let me know if there are questions.  [facility] or xray: Walk in 8:30-4:30 during weekdays, no appointment needed [address]  [place], [place] [id number]      Signed,   Reyes Pines, MD [facility], [facility] 08/19/23 3:22 PM

## 2023-08-20 LAB — COMPREHENSIVE METABOLIC PANEL WITH GFR
ALT: 15 U/L (ref 0–53)
AST: 19 U/L (ref 0–37)
Albumin: 4.5 g/dL (ref 3.5–5.2)
Alkaline Phosphatase: 55 U/L (ref 39–117)
BUN: 12 mg/dL (ref 6–23)
CO2: 29 meq/L (ref 19–32)
Calcium: 9.3 mg/dL (ref 8.4–10.5)
Chloride: 102 meq/L (ref 96–112)
Creatinine, Ser: 1 mg/dL (ref 0.40–1.50)
GFR: 80.18 mL/min (ref >60.00)
Glucose, Bld: 95 mg/dL (ref 70–99)
Potassium: 4.1 meq/L (ref 3.5–5.1)
Sodium: 140 meq/L (ref 135–145)
Total Bilirubin: 0.3 mg/dL (ref 0.2–1.2)
Total Protein: 7.4 g/dL (ref 6.0–8.3)

## 2023-08-20 LAB — HEMOGLOBIN A1C: Hgb A1c MFr Bld: 7.9 % — ABNORMAL HIGH (ref 4.6–6.5)

## 2023-08-21 ENCOUNTER — Ambulatory Visit: Payer: Self-pay | Admitting: Family Medicine

## 2023-08-21 DIAGNOSIS — E1165 Type 2 diabetes mellitus with hyperglycemia: Secondary | ICD-10-CM

## 2023-08-23 NOTE — Telephone Encounter (Signed)
 Patient would like to have Jardiance  sent is as recommended in previous note.

## 2023-08-24 MED ORDER — EMPAGLIFLOZIN 10 MG PO TABS: 10.0000 mg | ORAL_TABLET | Freq: Every day | ORAL | 1 refills | Status: DC

## 2023-08-24 NOTE — Telephone Encounter (Signed)
 Sent. Thanks.

## 2023-08-25 ENCOUNTER — Telehealth: Payer: Self-pay

## 2023-08-25 DIAGNOSIS — K31A0 Gastric intestinal metaplasia, unspecified: Secondary | ICD-10-CM

## 2023-08-25 DIAGNOSIS — D508 Other iron deficiency anemias: Secondary | ICD-10-CM

## 2023-08-25 NOTE — Telephone Encounter (Signed)
 Pt notified by phone that he is scheduled for EGD with gastric mapping on 10-01-23 at 9 am with Dr San.  Patient scheduled for telephone preop visit on 09-17-23 at 10:30 am.  Patient also scheduled for video capsule endoscopy on 10-05-23 at 8:30 am.  Instructions sent to patient in MyChart.    Patient agreed to plan and verbalized understanding.  No further questions or concerns.

## 2023-08-25 NOTE — Telephone Encounter (Signed)
-----   Message from Sandor LULLA Flatter sent at 08/16/2023  8:04 AM EDT ----- Ian Salazar,  EGD in 06/2021 looked good.  Did biopsies throughout the stomach for GIM mapping which did show intestinal metaplasia, but no dysplasia or H. pylori.  Had recommended repeat EGD in about 2 years at that time, so we can get that set up and push deeper into the small bowel to ensure no AVMs or other bleeding lesions.  Otherwise colonoscopy at that same time with a single 6 mm adenoma and diverticulosis, but otherwise pretty unremarkable.    Given his ongoing anemia, assuming the repeat EGD is unremarkable, I will also get him set up for VCE for further small bowel interrogation.  Nat, can you please schedule EGD with gastric mapping in the [facility] with me along with video capsule endoscopy.  Thanks.  VC ----- Message ----- From: Federico Norleen ONEIDA MADISON, MD Sent: 08/11/2023   8:14 PM EDT To: Sandor Flatter LULLA, DO  Ian Sandor,  I saw Mr. Bumbaugh in clinic today.  We are currently treating him with p.o. iron therapy for iron deficiency.  He has had a modest response to p.o. iron therapy.  I do suspect he may have some continued blood loss preventing his hemoglobin and iron levels from normalizing entirely.  He underwent EGD and colonoscopy in 2023.  His or any additional testing would recommend at this time?  Best,  Salazar

## 2023-09-17 ENCOUNTER — Ambulatory Visit (AMBULATORY_SURGERY_CENTER): Payer: PRIVATE HEALTH INSURANCE | Admitting: *Deleted

## 2023-09-17 ENCOUNTER — Telehealth: Payer: Self-pay | Admitting: *Deleted

## 2023-09-17 VITALS — Ht 69.5 in | Wt 204.0 lb

## 2023-09-17 DIAGNOSIS — K31A0 Gastric intestinal metaplasia, unspecified: Secondary | ICD-10-CM

## 2023-09-17 NOTE — Telephone Encounter (Signed)
**Note De-identified  [place] Obfuscation** Please advise 

## 2023-09-17 NOTE — Progress Notes (Signed)
 Pre visit completed over telephone. Instructions through Mychart.  Patient understands EGD and capsule endoscoy are on different days.   No egg or soy allergy known to patient  No issues known to pt with past sedation with any surgeries or procedures Patient denies ever being told they had issues or difficulty with intubation  No FH of Malignant Hyperthermia Pt is not on diet pills Pt is not on  home 02  Pt is not on blood thinners  Pt denies issues with constipation  No A fib or A flutter Have any cardiac testing pending-- NO Pt instructed to use [email or web address] or GoodRx for a price reduction on prep

## 2023-09-17 NOTE — Telephone Encounter (Signed)
 Hi- can you please clarify if patient is to have colonoscopy at same time?  There is something in notes about him having colonoscopy at same time, but he was only scheduled for EGD followed by capsule endoscopy a few days later.

## 2023-09-27 ENCOUNTER — Encounter: Payer: Self-pay | Admitting: Internal Medicine

## 2023-09-29 ENCOUNTER — Telehealth: Payer: Self-pay

## 2023-09-29 NOTE — Telephone Encounter (Signed)
 Left message for patient to return call to further discuss the need to reschedule capsule endoscopy scheduled for 10-05-23.  Per April with prior auth department, insurance is requiring results of EGD before they will give approval for capsule endoscopy.  Capsule endoscopy will need to be rescheduled for a later date in order to provide insurance company with EGD results.  Will continue efforts.

## 2023-09-30 NOTE — Telephone Encounter (Signed)
Patient returning phone call. Requesting a call back. Please advise, thank you.

## 2023-09-30 NOTE — Telephone Encounter (Signed)
 Patient aware that he has been rescheduled for capsule endoscopy on 10-26-23 at 8:15am.  Instructions reviewed with patient and sent to him in MyChart.  Patient agreed to plan and verbalized understanding.  No further questions.

## 2023-10-01 ENCOUNTER — Ambulatory Visit (AMBULATORY_SURGERY_CENTER): Payer: PRIVATE HEALTH INSURANCE | Admitting: Gastroenterology

## 2023-10-01 ENCOUNTER — Encounter: Payer: Self-pay | Admitting: Gastroenterology

## 2023-10-01 VITALS — BP 96/60 | HR 79 | Temp 97.5°F | Resp 11 | Ht 69.5 in | Wt 204.0 lb

## 2023-10-01 DIAGNOSIS — K31A0 Gastric intestinal metaplasia, unspecified: Secondary | ICD-10-CM

## 2023-10-01 DIAGNOSIS — K2950 Unspecified chronic gastritis without bleeding: Secondary | ICD-10-CM

## 2023-10-01 DIAGNOSIS — D509 Iron deficiency anemia, unspecified: Secondary | ICD-10-CM

## 2023-10-01 DIAGNOSIS — D508 Other iron deficiency anemias: Secondary | ICD-10-CM

## 2023-10-01 MED ORDER — SODIUM CHLORIDE 0.9 % IV SOLN: 500.0000 mL | Freq: Once | INTRAVENOUS | Status: DC

## 2023-10-01 NOTE — Progress Notes (Signed)
 Pt's states no medical or surgical changes since previsit or office visit.

## 2023-10-01 NOTE — Progress Notes (Signed)
 GASTROENTEROLOGY PROCEDURE H&P NOTE   Primary Care Physician: Levora Reyes SAUNDERS, MD    Reason for Procedure:   Iron defiency anemia, gastric intestinal metaplasia  Plan:    EGD  Patient is appropriate for endoscopic procedure(s) in the ambulatory ([facility]) setting.  The nature of the procedure, as well as the risks, benefits, and alternatives were carefully and thoroughly reviewed with the patient. Ample time for discussion and questions allowed. The patient understood, was satisfied, and agreed to proceed.     HPI: Ian Salazar is a 63 y.o. male who presents for EGD with GIM mapping for known gastric intestinal metaplasia. Additionally, hx of IDA.   Follows w/ Dr. Federico in the Hematology Clinic for his IDA. Currently on oral iron and Hgb stable at 12.7, ferritin 35, sat 16%.   - Colonoscopy ( 2/ 20/ 2017) : 2 diminutive tubular adenomas - EGD ( 9/ 14/ 2021) : Normal esophagus, mild gastritis with single linear erosion in the antrum ( gastric intestinal metaplasia) , mild peptic duodenitis - EGD (06/27/2021): Normal esophagus, stomach, and duodenum.  Gastric biopsies taken throughout per GIM mapping protocol (path: Intestinal metaplasia of the antrum, otherwise normal).  Repeat 2 years - Colonoscopy (06/27/2021): 6 mm descending colon adenoma, sigmoid and ascending colon diverticulosis, normal TI.  Repeat in 7 years  Past Medical History:  Diagnosis Date   Abnormal EKG    Normal ETT 5 years ago   Anemia    Family history of pancreatic cancer    GERD (gastroesophageal reflux disease)    occasional   Hx of adenomatous colonic polyps 03/28/2015   Hyperlipidemia    Knee pain, right    Rotator cuff tendonitis    Left   Type II or unspecified type diabetes mellitus without mention of complication, not stated as uncontrolled     Past Surgical History:  Procedure Laterality Date   COLONOSCOPY W/ POLYPECTOMY  03/25/2015   MULTIPLE TOOTH EXTRACTIONS     POLYPECTOMY     UPPER  GASTROINTESTINAL ENDOSCOPY  10/17/2019    Prior to Admission medications   Medication Sig Start Date End Date Taking? Authorizing Provider  atorvastatin  (LIPITOR) 20 MG tablet TAKE 1 TABLET(20 MG) BY MOUTH DAILY 02/10/23   Levora Reyes SAUNDERS, MD  blood glucose meter kit and supplies KIT Dispense based on patient and insurance preference. Check your fasting blood sugars daily. (FOR ICD-9 250.00, 250.01). 12/12/16   Christopher Savannah, PA-C  empagliflozin  (JARDIANCE ) 10 MG TABS tablet Take 1 tablet (10 mg total) by mouth daily. 08/24/23   Levora Reyes SAUNDERS, MD  ferrous sulfate  325 (65 FE) MG EC tablet Take 1 tablet (325 mg total) by mouth daily. Take with source of Vitamin C 08/19/23   Levora Reyes SAUNDERS, MD  glucose blood [facility] ULTRA) test strip USE TO TEST FASTING BLOOD SUGARS DAILY 05/25/22   Levora Reyes SAUNDERS, MD  lisinopril  (ZESTRIL ) 10 MG tablet TAKE 1 TABLET(10 MG) BY MOUTH DAILY 01/29/23   Levora Reyes SAUNDERS, MD  metFORMIN  (GLUCOPHAGE ) 1000 MG tablet TAKE 1 TABLET(1000 MG) BY MOUTH TWICE DAILY WITH A MEAL 02/25/23   Levora Reyes SAUNDERS, MD  omeprazole  (PRILOSEC) 20 MG capsule Take 1 capsule (20 mg total) by mouth daily. 05/14/23   Levora Reyes SAUNDERS, MD  [facility] DELICA LANCETS 33G MISC USE TO TEST FASTING BLOOD SUGARS DAILY 02/23/17   Christopher Savannah, PA-C    Current Outpatient Medications  Medication Sig Dispense Refill   atorvastatin  (LIPITOR) 20 MG tablet TAKE 1 TABLET(20  MG) BY MOUTH DAILY 90 tablet 2   blood glucose meter kit and supplies KIT Dispense based on patient and insurance preference. Check your fasting blood sugars daily. (FOR ICD-9 250.00, 250.01). 1 each 0   empagliflozin  (JARDIANCE ) 10 MG TABS tablet Take 1 tablet (10 mg total) by mouth daily. 90 tablet 1   ferrous sulfate  325 (65 FE) MG EC tablet Take 1 tablet (325 mg total) by mouth daily. Take with source of Vitamin C 90 tablet 3   glucose blood (ONETOUCH ULTRA) test strip USE TO TEST FASTING BLOOD SUGARS DAILY 100 strip 0   lisinopril   (ZESTRIL ) 10 MG tablet TAKE 1 TABLET(10 MG) BY MOUTH DAILY 90 tablet 2   metFORMIN  (GLUCOPHAGE ) 1000 MG tablet TAKE 1 TABLET(1000 MG) BY MOUTH TWICE DAILY WITH A MEAL 180 tablet 2   omeprazole  (PRILOSEC) 20 MG capsule Take 1 capsule (20 mg total) by mouth daily. 90 capsule 2   ONETOUCH DELICA LANCETS 33G MISC USE TO TEST FASTING BLOOD SUGARS DAILY 100 each 11   No current facility-administered medications for this visit.    Allergies as of 10/01/2023   (No Known Allergies)    Family History  Problem Relation Age of Onset   Hypertension Mother    Lung cancer Mother 109       smoker   Diabetes Father    Dementia Father    Heart disease Sister    Diabetes Sister    Diabetes Sister    Diabetes Brother    Pancreatic cancer Brother 69   Leukemia Maternal Aunt    Diabetes Maternal Grandmother    Diabetes Other        1st degree relative   Alcohol abuse Neg Hx    Stroke Neg Hx    Hyperlipidemia Neg Hx    Colon cancer Neg Hx    Colon polyps Neg Hx    Crohn's disease Neg Hx    Esophageal cancer Neg Hx    Rectal cancer Neg Hx    Stomach cancer Neg Hx     Social History   Socioeconomic History   Marital status: Married    Spouse name: Not on file   Number of children: 1   Years of education: Not on file   Highest education level: 12th grade  Occupational History   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    Education: [employer].   Cigarette smoker, no drug use no alcohol since 20 18-19      Social Drivers of Corporate investment banker Strain: Low Risk  (08/19/2023)   Overall Financial Resource Strain (CARDIA)    Difficulty of Paying Living Expenses: Not hard at all  Food Insecurity: No Food Insecurity (08/19/2023)   Hunger Vital Sign    Worried  About Running Out of Food in the Last Year: Never true    Ran Out of Food in the Last Year: Never true  Transportation Needs: No Transportation Needs (08/19/2023)   PRAPARE - Administrator, Civil Service (Medical): No    Lack of Transportation (Non-Medical): No  Physical Activity: Insufficiently Active (08/19/2023)   Exercise Vital Sign    Days of Exercise per Week: 1 day    Minutes of Exercise per Session: 70 min  Stress: No Stress Concern Present (08/19/2023)   [employer] of Occupational Health - Occupational Stress Questionnaire    Feeling of Stress: Not at all  Social Connections: Socially Integrated (08/19/2023)   Social Connection and Isolation Panel    Frequency of Communication with Friends and Family: Three times a week    Frequency of Social Gatherings with Friends and Family: Once a week    Attends Religious Services: More than 4 times per year    Active Member of [employer] or Organizations: Yes    Attends Banker Meetings: More than 4 times per year    Marital Status: Married  Catering manager Violence: Unknown (05/27/2021)   Received from [facility]   HITS    Physically Hurt: Not on file    Insult or Talk Down To: Not on file    Threaten Physical Harm: Not on file    Scream or Curse: Not on file    Physical Exam: Vital signs in last 24 hours: @There  were no vitals taken for this visit. GEN: NAD EYE: Sclerae anicteric ENT: MMM CV: Non-tachycardic Pulm: CTA b/l GI: Soft, NT/ND NEURO:  Alert & Oriented x 3   Sandor Flatter, DO [facility]   10/01/2023 9:01 AM

## 2023-10-01 NOTE — Patient Instructions (Signed)
  Resume all of your previous medications today as ordered. Read all of your discharge instructions. Biopsies will be back in 2 weeks. We will send you a letter.   YOU HAD AN ENDOSCOPIC PROCEDURE TODAY AT THE [facility]:   Refer to the procedure report that was given to you for any specific questions about what was found during the examination.  If the procedure report does not answer your questions, please call your gastroenterologist to clarify.  If you requested that your care partner not be given the details of your procedure findings, then the procedure report has been included in a sealed envelope for you to review at your convenience later.  YOU SHOULD EXPECT: Some feelings of bloating in the abdomen. Passage of more gas than usual.  Walking can help get rid of the air that was put into your GI tract during the procedure and reduce the bloating.   Please Note:  You might notice some irritation and congestion in your nose or some drainage.  This is from the oxygen used during your procedure.  There is no need for concern and it should clear up in a day or so.  SYMPTOMS TO REPORT IMMEDIATELY:   Following upper endoscopy (EGD)  Vomiting of blood or coffee ground material  New chest pain or pain under the shoulder blades  Painful or persistently difficult swallowing  New shortness of breath  Fever of 100F or higher  Black, tarry-looking stools  For urgent or emergent issues, a gastroenterologist can be reached at any hour by calling [phone]. Do not use MyChart messaging for urgent concerns.    DIET:  We do recommend a small meal at first, but then you may proceed to your regular diet.  Drink plenty of fluids but you should avoid alcoholic beverages for 24 hours.  ACTIVITY:  You should plan to take it easy for the rest of today and you should NOT DRIVE or use heavy machinery until tomorrow (because of the sedation medicines used during the test).    FOLLOW  UP: Our staff will call the number listed on your records the next business day following your procedure.  We will call around 7:15- 8:00 am to check on you and address any questions or concerns that you may have regarding the information given to you following your procedure. If we do not reach you, we will leave a message.     If any biopsies were taken you will be contacted by phone or by letter within the next 1-3 weeks.  Please call us  at [phone] if you have not heard about the biopsies in 3 weeks.    SIGNATURES/CONFIDENTIALITY: You and/or your care partner have signed paperwork which will be entered into your electronic medical record.  These signatures attest to the fact that that the information above on your After Visit Summary has been reviewed and is understood.  Full responsibility of the confidentiality of this discharge information lies with you and/or your care-partner.

## 2023-10-01 NOTE — Progress Notes (Signed)
 Pt sedate, gd SR's, VSS, report to RN

## 2023-10-01 NOTE — Op Note (Signed)
 [facility] Patient Name: Ian Salazar Procedure Date: 10/01/2023 10:48 AM MRN: [id number] Endoscopist: Sandor Flatter , MD, [id number] Age: 63 Referring MD:  Date of Birth: Jan 06, 1961 Gender: Male Account #: [id number] Procedure:                Upper GI endoscopy Indications:              Iron deficiency anemia, Follow-up of intestinal                            metaplasia Medicines:                Monitored Anesthesia Care Procedure:                Pre-Anesthesia Assessment:                           - Prior to the procedure, a History and Physical                            was performed, and patient medications and                            allergies were reviewed. The patient's tolerance of                            previous anesthesia was also reviewed. The risks                            and benefits of the procedure and the sedation                            options and risks were discussed with the patient.                            All questions were answered, and informed consent                            was obtained. Prior Anticoagulants: The patient has                            taken no anticoagulant or antiplatelet agents. ASA                            Grade Assessment: II - A patient with mild systemic                            disease. After reviewing the risks and benefits,                            the patient was deemed in satisfactory condition to                            undergo the procedure.  After obtaining informed consent, the endoscope was                            passed under direct vision. Throughout the                            procedure, the patient's blood pressure, pulse, and                            oxygen saturations were monitored continuously. The                            GIF [id number] #[id number] was introduced through the                            mouth, and advanced to the second part of  duodenum.                            The upper GI endoscopy was accomplished without                            difficulty. The patient tolerated the procedure                            well. Scope In: Scope Out: Findings:                 The examined esophagus was normal.                           The gastroesophageal flap valve was visualized                            endoscopically and classified as Hill Grade III                            (minimal fold, loose to endoscope, hiatal hernia                            likely).                           The entire examined stomach was normal. Biopsies                            were taken throughout the stomach with a cold                            forceps per GIM mapping protocol. Estimated blood                            loss was minimal.                           The examined duodenum was normal. Complications:  No immediate complications. Estimated Blood Loss:     Estimated blood loss was minimal. Impression:               - Normal esophagus.                           - Gastroesophageal flap valve classified as Hill                            Grade III (minimal fold, loose to endoscope, hiatal                            hernia likely).                           - Normal stomach. Biopsies taken throughout the                            stomach per GIM mapping protocol.                           - Normal examined duodenum. Recommendation:           - Patient has a contact number available for                            emergencies. The signs and symptoms of potential                            delayed complications were discussed with the                            patient. Return to normal activities tomorrow.                            Written discharge instructions were provided to the                            patient.                           - Resume previous diet.                           - Continue present  medications.                           - Await pathology results.                           - Repeat upper endoscopy for surveillance based on                            pathology results. Sandor Flatter, MD 10/01/2023 11:14:54 AM

## 2023-10-05 ENCOUNTER — Telehealth: Payer: Self-pay

## 2023-10-05 NOTE — Telephone Encounter (Signed)
 Left message

## 2023-10-06 LAB — SURGICAL PATHOLOGY

## 2023-10-07 ENCOUNTER — Ambulatory Visit: Payer: Self-pay | Admitting: Gastroenterology

## 2023-10-11 ENCOUNTER — Telehealth: Payer: Self-pay | Admitting: Gastroenterology

## 2023-10-11 NOTE — Telephone Encounter (Signed)
 Received a call from [employer], regarding a capsule test that was ordered for the patient, is requesting callback at [phone] to discuss prior Auth, Please review and advise  Thank you

## 2023-10-14 ENCOUNTER — Telehealth: Payer: Self-pay | Admitting: Gastroenterology

## 2023-10-14 NOTE — Telephone Encounter (Signed)
 Received call from patient insurance , States she has been discussing with April mcpeak, is requesting fu to finish discussing this patients next step. Would like procedure report faxed to [phone], and would prefer the call back at [phone] to discuss if capsule is appropriate. Please review and advise   Thank you

## 2023-10-26 ENCOUNTER — Telehealth: Payer: Self-pay | Admitting: Gastroenterology

## 2023-10-26 NOTE — Telephone Encounter (Signed)
 Inbound call from patient requesting a call back to discuss instructions for tomorrows scheduled capsule endoscopy. Please advise.

## 2023-10-26 NOTE — Telephone Encounter (Signed)
 Returned call to the patient who had questions regarding capsule endoscopy instructions.  Patient did not discontinue iron 7 days prior and was advised that he will need to be rescheduled to a later date.  Patient advised that he has been rescheduled to 11-08-23.  Instructions discussed by phone and patient aware that new instructions were sent to MyChart.  Patient agreed to plan and verbalized understanding.  No further questions.

## 2023-11-08 ENCOUNTER — Ambulatory Visit (INDEPENDENT_AMBULATORY_CARE_PROVIDER_SITE_OTHER): Payer: PRIVATE HEALTH INSURANCE | Admitting: Gastroenterology

## 2023-11-08 DIAGNOSIS — D508 Other iron deficiency anemias: Secondary | ICD-10-CM

## 2023-11-08 NOTE — Progress Notes (Signed)
 Capsule ID: [id number] Exp: 2024-05-11 LOT: [id number]  Patient arrived for VCE. Reported the prep went well. This RN explained capsule dietary restrictions for the next few hours. Pt advised to return at 4 pm to return capsule equipment.  Patient verbalized understanding. Opened capsule, ensured capsule was flashing prior to the patient swallowing the capsule. Patient swallowed capsule without difficulty.  Patient told to call the office with any questions and if capsule has not passed after 72 hours. No further questions by the conclusion of the visit.

## 2023-11-08 NOTE — Patient Instructions (Signed)
   You may have a clear liquid diet at 10:30 am  You may have a light lunch beginning at 12:30 ( ex 1/2 sandwich and a bowl of soup)  You may resume your normal diet at 5:00 pm  If you experience any abdominal pain, nausea ,or vomiting after ingesting the capsule please call [phone] to [place], RN  You may not have an MRI until you have confirmation that you have passed the capsule.   Please return to the office by 4 PM today to return equipment.

## 2023-11-10 ENCOUNTER — Other Ambulatory Visit: Payer: Self-pay | Admitting: Hematology and Oncology

## 2023-11-10 ENCOUNTER — Inpatient Hospital Stay: Payer: PRIVATE HEALTH INSURANCE | Attending: Hematology and Oncology

## 2023-11-10 DIAGNOSIS — D500 Iron deficiency anemia secondary to blood loss (chronic): Secondary | ICD-10-CM

## 2023-11-10 DIAGNOSIS — D509 Iron deficiency anemia, unspecified: Secondary | ICD-10-CM | POA: Diagnosis present

## 2023-11-10 DIAGNOSIS — Z801 Family history of malignant neoplasm of trachea, bronchus and lung: Secondary | ICD-10-CM | POA: Insufficient documentation

## 2023-11-10 DIAGNOSIS — Z87891 Personal history of nicotine dependence: Secondary | ICD-10-CM | POA: Insufficient documentation

## 2023-11-10 DIAGNOSIS — Z800 Family history of malignant neoplasm of digestive organs: Secondary | ICD-10-CM | POA: Insufficient documentation

## 2023-11-10 LAB — CMP (CANCER CENTER ONLY)
ALT: 16 U/L (ref 0–44)
AST: 17 U/L (ref 15–41)
Albumin: 4.2 g/dL (ref 3.5–5.0)
Alkaline Phosphatase: 60 U/L (ref 38–126)
Anion gap: 7 (ref 5–15)
BUN: 11 mg/dL (ref 8–23)
CO2: 26 mmol/L (ref 22–32)
Calcium: 9.4 mg/dL (ref 8.9–10.3)
Chloride: 107 mmol/L (ref 98–111)
Creatinine: 0.93 mg/dL (ref 0.61–1.24)
GFR, Estimated: >60 mL/min (ref >60)
Glucose, Bld: 175 mg/dL — ABNORMAL HIGH (ref 70–99)
Potassium: 3.8 mmol/L (ref 3.5–5.1)
Sodium: 140 mmol/L (ref 135–145)
Total Bilirubin: 0.4 mg/dL (ref 0.0–1.2)
Total Protein: 7.1 g/dL (ref 6.5–8.1)

## 2023-11-10 LAB — RETIC PANEL
Immature Retic Fract: 8.7 % (ref 2.3–15.9)
RBC.: 4.7 MIL/uL (ref 4.22–5.81)
Retic Count, Absolute: 69.6 K/uL (ref 19.0–186.0)
Retic Ct Pct: 1.5 % (ref 0.4–3.1)
Reticulocyte Hemoglobin: 32.5 pg (ref >27.9)

## 2023-11-10 LAB — IRON AND IRON BINDING CAPACITY (CC-WL,HP ONLY)
Iron: 70 ug/dL (ref 45–182)
Saturation Ratios: 21 % (ref 17.9–39.5)
TIBC: 337 ug/dL (ref 250–450)
UIBC: 267 ug/dL (ref 117–376)

## 2023-11-10 LAB — CBC WITH DIFFERENTIAL (CANCER CENTER ONLY)
Abs Immature Granulocytes: 0 K/uL (ref 0.00–0.07)
Basophils Absolute: 0 K/uL (ref 0.0–0.1)
Basophils Relative: 1 %
Eosinophils Absolute: 0 K/uL (ref 0.0–0.5)
Eosinophils Relative: 1 %
HCT: 40.7 % (ref 39.0–52.0)
Hemoglobin: 13.3 g/dL (ref 13.0–17.0)
Immature Granulocytes: 0 %
Lymphocytes Relative: 26 %
Lymphs Abs: 0.9 K/uL (ref 0.7–4.0)
MCH: 27.9 pg (ref 26.0–34.0)
MCHC: 32.7 g/dL (ref 30.0–36.0)
MCV: 85.3 fL (ref 80.0–100.0)
Monocytes Absolute: 0.4 K/uL (ref 0.1–1.0)
Monocytes Relative: 10 %
Neutro Abs: 2.1 K/uL (ref 1.7–7.7)
Neutrophils Relative %: 62 %
Platelet Count: 163 K/uL (ref 150–400)
RBC: 4.77 MIL/uL (ref 4.22–5.81)
RDW: 13.2 % (ref 11.5–15.5)
WBC Count: 3.4 K/uL — ABNORMAL LOW (ref 4.0–10.5)
nRBC: 0 % (ref 0.0–0.2)

## 2023-11-10 LAB — FERRITIN: Ferritin: 37 ng/mL (ref 24–336)

## 2023-11-17 ENCOUNTER — Other Ambulatory Visit: Payer: Self-pay | Admitting: Hematology and Oncology

## 2023-11-17 ENCOUNTER — Inpatient Hospital Stay (HOSPITAL_BASED_OUTPATIENT_CLINIC_OR_DEPARTMENT_OTHER): Payer: PRIVATE HEALTH INSURANCE | Admitting: Hematology and Oncology

## 2023-11-17 VITALS — BP 118/72 | HR 93 | Temp 98.0°F | Resp 16 | Wt 202.7 lb

## 2023-11-17 DIAGNOSIS — D500 Iron deficiency anemia secondary to blood loss (chronic): Secondary | ICD-10-CM | POA: Diagnosis not present

## 2023-11-17 DIAGNOSIS — D509 Iron deficiency anemia, unspecified: Secondary | ICD-10-CM | POA: Diagnosis not present

## 2023-11-17 NOTE — Progress Notes (Signed)
 [facility] [phone]   [phone]  PROGRESS NOTE  Patient Care Team: Levora Reyes SAUNDERS, MD as PCP - General (Family Medicine) [facility]  Hematological/Oncological History # Iron Deficiency Anemia of Unclear Etiology 01/14/2015: WBC 5.0, Hgb 13.8, MCV 84.7, Plt 199 01/28/2023: WBC 4.1, Hgb 12.8, MCV 83.5, Plt 145.  03/04/2023: ferritin 11, Hgb 12.5  04/29/2023: establish care with Dr. Federico   Interval History:  Ian Salazar 63 y.o. male with medical history significant for iron deficiency anemia 2/2 to unclear etiology who presents for a follow up visit. The patient's last visit was on 04/29/2023. In the interim since the last visit he has had no major changes in his health.  On exam today Ian Salazar reports he has been doing well overall in the interim since our last visit.  He reports his energy levels are improving and they are currently up to a 9 out of 10.  He is not having any lightheadedness, dizziness, or shortness of breath.  He is tolerating his iron pills well with no major stomach upset.  Does cause some dark stools but no constipation or diarrhea.  He reports he is eating red meat approximately 2 times a week and does enjoy eating spinach.  He reports no overt signs of bleeding such as nosebleeds, gum bleeding, or blood in the urine or stool.  He reports that he has lost about 19 pounds over the last year intentionally as his wife is eating better and frying less food and trying to do more baking.  He reports that he is undergoing a camera endoscopy currently.  He otherwise denies any fevers, chills, sweats, nausea vomiting or diarrhea.  A full 10 point ROS otherwise negative.  MEDICAL HISTORY:  Past Medical History:  Diagnosis Date   Abnormal EKG    Normal ETT 5 years ago   Anemia    Family history of pancreatic cancer    GERD (gastroesophageal reflux disease)    occasional   Hx of adenomatous  colonic polyps 03/28/2015   Hyperlipidemia    Knee pain, right    Rotator cuff tendonitis    Left   Type II or unspecified type diabetes mellitus without mention of complication, not stated as uncontrolled     SURGICAL HISTORY: Past Surgical History:  Procedure Laterality Date   COLONOSCOPY     COLONOSCOPY W/ POLYPECTOMY  03/25/2015   MULTIPLE TOOTH EXTRACTIONS     POLYPECTOMY     UPPER GASTROINTESTINAL ENDOSCOPY  10/17/2019    SOCIAL HISTORY: Social History   Socioeconomic History   Marital status: Married    Spouse name: Not on file   Number of children: 1   Years of education: Not on file   Highest education level: 12th grade  Occupational History   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    Education: [employer].   Cigarette smoker, no drug use no alcohol since 20 18-19      Social Drivers of Corporate investment banker Strain: Low Risk  (08/19/2023)   Overall Financial Resource Strain (CARDIA)    Difficulty of Paying Living Expenses: Not hard at all  Food Insecurity: No Food Insecurity (08/19/2023)   Hunger Vital Sign    Worried About Running Out of Food in the Last Year: Never true    Ran Out of Food in the Last Year: Never true  Transportation Needs: No Transportation Needs (08/19/2023)   PRAPARE - Administrator, Civil Service (Medical): No    Lack of  Transportation (Non-Medical): No  Physical Activity: Insufficiently Active (08/19/2023)   Exercise Vital Sign    Days of Exercise per Week: 1 day    Minutes of Exercise per Session: 70 min  Stress: No Stress Concern Present (08/19/2023)   [employer] of Occupational Health - Occupational Stress Questionnaire    Feeling of Stress: Not at all  Social Connections: Socially Integrated (08/19/2023)   Social Connection and Isolation Panel    Frequency of Communication with Friends and Family: Three times a week    Frequency of Social Gatherings with Friends and Family: Once a week    Attends Religious Services: More than 4 times per year    Active Member of [employer] or Organizations: Yes    Attends Engineer, structural: More than 4 times per year    Marital Status: Married  Catering manager Violence: Unknown (05/27/2021)   Received from [facility]   HITS    Physically Hurt: Not on file    Insult or Talk Down To: Not on file    Threaten Physical Harm: Not on file    Scream or Curse: Not on file    FAMILY HISTORY: Family History  Problem Relation Age of Onset   Hypertension Mother    Lung cancer Mother 65       smoker   Diabetes Father    Dementia Father    Heart disease Sister    Diabetes Sister    Diabetes Sister    Diabetes Brother    Pancreatic cancer Brother 66   Leukemia Maternal Aunt    Diabetes Maternal Grandmother    Diabetes Other        1st degree relative   Alcohol abuse Neg Hx    Stroke Neg Hx    Hyperlipidemia Neg Hx    Colon cancer Neg Hx    Colon polyps Neg Hx    Crohn's disease Neg Hx    Esophageal cancer Neg Hx    Rectal cancer Neg Hx    Stomach cancer Neg Hx     ALLERGIES:  has no known allergies.  MEDICATIONS:  Current Outpatient Medications  Medication Sig Dispense Refill   atorvastatin  (LIPITOR) 20 MG tablet TAKE 1 TABLET(20 MG) BY MOUTH DAILY 90 tablet 2   blood glucose meter kit and supplies KIT Dispense based on patient and  insurance preference. Check your fasting blood sugars daily. (FOR ICD-9 250.00, 250.01). 1 each 0   empagliflozin  (JARDIANCE ) 10 MG TABS tablet Take 1 tablet (10 mg total) by mouth daily. 90 tablet 1   ferrous sulfate  325 (65 FE) MG EC tablet Take 1 tablet (325 mg total) by mouth daily. Take with source of Vitamin C 90 tablet  3   glucose blood (ONETOUCH ULTRA) test strip USE TO TEST FASTING BLOOD SUGARS DAILY 100 strip 0   lisinopril  (ZESTRIL ) 10 MG tablet TAKE 1 TABLET(10 MG) BY MOUTH DAILY 90 tablet 2   metFORMIN  (GLUCOPHAGE ) 1000 MG tablet TAKE 1 TABLET(1000 MG) BY MOUTH TWICE DAILY WITH A MEAL 180 tablet 2   omeprazole  (PRILOSEC) 20 MG capsule Take 1 capsule (20 mg total) by mouth daily. 90 capsule 2   ONETOUCH DELICA LANCETS 33G MISC USE TO TEST FASTING BLOOD SUGARS DAILY 100 each 11   No current facility-administered medications for this visit.    REVIEW OF SYSTEMS:   Constitutional: ( - ) fevers, ( - )  chills , ( - ) night sweats Eyes: ( - ) blurriness of vision, ( - ) double vision, ( - ) watery eyes Ears, nose, mouth, throat, and face: ( - ) mucositis, ( - ) sore throat Respiratory: ( - ) cough, ( - ) dyspnea, ( - ) wheezes Cardiovascular: ( - ) palpitation, ( - ) chest discomfort, ( - ) lower extremity swelling Gastrointestinal:  ( - ) nausea, ( - ) heartburn, ( - ) change in bowel habits Skin: ( - ) abnormal skin rashes Lymphatics: ( - ) new lymphadenopathy, ( - ) easy bruising Neurological: ( - ) numbness, ( - ) tingling, ( - ) new weaknesses Behavioral/Psych: ( - ) mood change, ( - ) new changes  All other systems were reviewed with the patient and are negative.  PHYSICAL EXAMINATION:  Vitals:   11/17/23 1545  BP: 118/72  Pulse: 93  Resp: 16  Temp: 98 F (36.7 C)  SpO2: 98%    Filed Weights   11/17/23 1545  Weight: 202 lb 11.2 oz (91.9 kg)     GENERAL: Well-appearing middle-aged African-American male, alert, no distress and comfortable SKIN: skin color,  texture, turgor are normal, no rashes or significant lesions EYES: conjunctiva are pink and non-injected, sclera clear OROPHARYNX: no exudate, no erythema; lips, buccal mucosa, and tongue normal  NECK: supple, non-tender LYMPH:  no palpable lymphadenopathy in the cervical, axillary or inguinal LUNGS: clear to auscultation and percussion with normal breathing effort HEART: regular rate & rhythm and no murmurs and no lower extremity edema ABDOMEN: soft, non-tender, non-distended, normal bowel sounds Musculoskeletal: no cyanosis of digits and no clubbing  PSYCH: alert & oriented x 3, fluent speech NEURO: no focal motor/sensory deficits  LABORATORY DATA:  I have reviewed the data as listed    Latest Ref Rng & Units 11/10/2023    3:58 PM 07/30/2023    9:54 AM 04/29/2023    9:59 AM  CBC  WBC 4.0 - 10.5 K/uL 3.4  4.4  3.7   Hemoglobin 13.0 - 17.0 g/dL 86.6  87.2  87.3   Hematocrit 39.0 - 52.0 % 40.7  39.4  39.9   Platelets 150 - 400 K/uL 163  134  141        Latest Ref Rng & Units 11/10/2023    3:58 PM 08/19/2023    3:25 PM 07/30/2023    9:54 AM  CMP  Glucose 70 - 99 mg/dL 824  95  841   BUN 8 - 23 mg/dL 11  12  12    Creatinine 0.61 - 1.24 mg/dL 9.06  8.99  9.21   Sodium 135 - 145 mmol/L 140  140  140   Potassium 3.5 - 5.1 mmol/L 3.8  4.1  3.9   Chloride 98 - 111 mmol/L  107  102  108   CO2 22 - 32 mmol/L 26  29  26    Calcium  8.9 - 10.3 mg/dL 9.4  9.3  9.1   Total Protein 6.5 - 8.1 g/dL 7.1  7.4  7.0   Total Bilirubin 0.0 - 1.2 mg/dL 0.4  0.3  0.3   Alkaline Phos 38 - 126 U/L 60  55  60   AST 15 - 41 U/L 17  19  15    ALT 0 - 44 U/L 16  15  13      RADIOGRAPHIC STUDIES: No results found.  ASSESSMENT & PLAN Ian Salazar 63 y.o. male with medical history significant for iron deficiency anemia 2/2 to unclear etiology who presents for a follow up visit.   # Iron Deficiency Anemia 2/2 to GI Bleeding (Suspected) -- Findings are consistent with iron deficiency anemia secondary to  GI bleeding.  --continue follow-up with GI for evaluation.  Patient last underwent EGD and colonoscopy in May 2023. --If patient is refractory to p.o. iron therapy will provide a round of IV iron therapy.   -- Patient follows with gastroenterology, currently undergoing a capsule endoscopy --continue ferrous sulfate  325 mg daily with a source of vitamin C --Labs today show white blood cell 3.4, hemoglobin 13.3, MCV 85.3, platelets 163.  Ferritin increased to 37 with iron sat of 21% --Plan for return to clinic in 6 months to reevaluate  No orders of the defined types were placed in this encounter.   All questions were answered. The patient knows to call the clinic with any problems, questions or concerns.  A total of more than 30 minutes were spent on this encounter with face-to-face time and non-face-to-face time, including preparing to see the patient, ordering tests and/or medications, counseling the patient and coordination of care as outlined above.   Norleen IVAR Kidney, MD Department of Hematology/Oncology [facility] Phone: [phone] Pager: [phone] Email: [email or web address]  11/17/2023 4:17 PM

## 2023-11-19 ENCOUNTER — Encounter: Payer: Self-pay | Admitting: Family Medicine

## 2023-11-19 ENCOUNTER — Ambulatory Visit: Payer: PRIVATE HEALTH INSURANCE | Admitting: Family Medicine

## 2023-11-19 ENCOUNTER — Telehealth: Payer: Self-pay

## 2023-11-19 VITALS — BP 102/54 | HR 86 | Temp 98.2°F | Resp 20 | Ht 69.5 in | Wt 201.2 lb

## 2023-11-19 DIAGNOSIS — E1165 Type 2 diabetes mellitus with hyperglycemia: Secondary | ICD-10-CM

## 2023-11-19 DIAGNOSIS — I10 Essential (primary) hypertension: Secondary | ICD-10-CM | POA: Diagnosis not present

## 2023-11-19 DIAGNOSIS — Z7984 Long term (current) use of oral hypoglycemic drugs: Secondary | ICD-10-CM

## 2023-11-19 DIAGNOSIS — E782 Mixed hyperlipidemia: Secondary | ICD-10-CM | POA: Diagnosis not present

## 2023-11-19 LAB — COMPREHENSIVE METABOLIC PANEL WITH GFR
ALT: 17 U/L (ref 0–53)
AST: 15 U/L (ref 0–37)
Albumin: 4.3 g/dL (ref 3.5–5.2)
Alkaline Phosphatase: 58 U/L (ref 39–117)
BUN: 12 mg/dL (ref 6–23)
CO2: 33 meq/L — ABNORMAL HIGH (ref 19–32)
Calcium: 9.6 mg/dL (ref 8.4–10.5)
Chloride: 102 meq/L (ref 96–112)
Creatinine, Ser: 0.88 mg/dL (ref 0.40–1.50)
GFR: 91.45 mL/min (ref >60.00)
Glucose, Bld: 107 mg/dL — ABNORMAL HIGH (ref 70–99)
Potassium: 4.3 meq/L (ref 3.5–5.1)
Sodium: 143 meq/L (ref 135–145)
Total Bilirubin: 0.4 mg/dL (ref 0.2–1.2)
Total Protein: 7.3 g/dL (ref 6.0–8.3)

## 2023-11-19 LAB — LIPID PANEL
Cholesterol: 119 mg/dL (ref 0–200)
HDL: 54.1 mg/dL (ref >39.00)
LDL Cholesterol: 56 mg/dL (ref 0–99)
NonHDL: 64.53
Total CHOL/HDL Ratio: 2
Triglycerides: 42 mg/dL (ref 0.0–149.0)
VLDL: 8.4 mg/dL (ref 0.0–40.0)

## 2023-11-19 LAB — HEMOGLOBIN A1C: Hgb A1c MFr Bld: 7 % — ABNORMAL HIGH (ref 4.6–6.5)

## 2023-11-19 NOTE — Progress Notes (Signed)
 Subjective:  Patient ID: Ian Salazar, male    DOB: 24-Jun-1960  Age: 63 y.o. MRN: [id number]  CC:  Chief Complaint  Patient presents with   Diabetes    Reading this morning fasting 127    HPI Ian Salazar presents for   Diabetes: With hyperglycemia last discussed in July.  Still elevated but minimal change at that time at 7.9.  Was on metformin  1000 mg twice daily.  Jardiance  10 mg daily added. He is on ACE inhibitor with lisinopril , statin with Lipitor. Followed by hematology with iron deficiency anemia, thought to be due to gastrointestinal bleeding.  Prior EGD, colonoscopy May 2023.  Undergoing camera endoscopy at that time.  Has not seen pass yet. Continuing on iron supplementation, with option of IV iron if insufficient treatment.  Hemoglobin 13.3 at his last visit few days ago.  42-month follow-up planned. Feeling ok.  Denies mycotic infection symptoms or UTI symptoms with use of Jardiance  or new side effects with metformin . Home readings - fating 127 this morning.  No postprandials.  No lows.  Microalbumin: 04/28/2023, normal. Optho, foot exam, pneumovax:  Ophthalmology exam: had in past year - requesting records.  Declines flu vaccine and COVID boosters. Lab Results  Component Value Date   HGBA1C 7.9 (H) 08/19/2023   HGBA1C 8.0 (H) 04/28/2023   HGBA1C 8.9 (H) 05/13/2022   Lab Results  Component Value Date   MICROALBUR -0.1 (L) 04/28/2023   LDLCALC 57 04/28/2023   CREATININE 0.93 11/10/2023    Hypertension: Lisinopril  10 mg daily. No new cough/side effects.  Home readings: none.  BP Readings from Last 3 Encounters:  11/19/23 (!) 102/54  11/17/23 118/72  10/01/23 96/60   Lab Results  Component Value Date   CREATININE 0.93 11/10/2023   Hyperlipidemia: Lipitor 20 mg daily.no myalgias/side effects.  Lab Results  Component Value Date   CHOL 119 04/28/2023   HDL 55.40 04/28/2023   LDLCALC 57 04/28/2023   TRIG 36.0 04/28/2023   CHOLHDL 2 04/28/2023    Lab Results  Component Value Date   ALT 16 11/10/2023   AST 17 11/10/2023   ALKPHOS 60 11/10/2023   BILITOT 0.4 11/10/2023     History Patient Active Problem List   Diagnosis Date Noted   Body mass index (BMI) 30.0-30.9, adult 04/28/2023   Gastro-esophageal reflux disease without esophagitis 04/28/2023   Hyperlipidemia 04/28/2023   Family history of pancreatic cancer 12/09/2020   Pain of left heel 03/07/2020   Hx of adenomatous colonic polyps 03/28/2015   History of adenomatous polyp of colon 03/28/2015   Routine general medical examination at a health care facility 07/17/2011   Mixed hyperlipidemia 07/17/2011   Abnormal chest x-ray 07/17/2011   Vitamin B 12 deficiency 04/22/2010   ANEMIA-NOS 04/18/2010   Anemia 04/18/2010   DEGENERATIVE JOINT DISEASE, KNEE 10/24/2008   Osteoarthritis 10/24/2008   DM w/o complication type II 02/27/2008   Past Medical History:  Diagnosis Date   Abnormal EKG    Normal ETT 5 years ago   Anemia    Family history of pancreatic cancer    GERD (gastroesophageal reflux disease)    occasional   Hx of adenomatous colonic polyps 03/28/2015   Hyperlipidemia    Hypertension 99999999   Knee pain, right    Rotator cuff tendonitis    Left   Type II or unspecified type diabetes mellitus without mention of complication, not stated as uncontrolled    Past Surgical History:  Procedure Laterality Date  COLONOSCOPY     COLONOSCOPY W/ POLYPECTOMY  03/25/2015   MULTIPLE TOOTH EXTRACTIONS     POLYPECTOMY     UPPER GASTROINTESTINAL ENDOSCOPY  10/17/2019   No Known Allergies Prior to Admission medications   Medication Sig Start Date End Date Taking? Authorizing Provider  atorvastatin  (LIPITOR) 20 MG tablet TAKE 1 TABLET(20 MG) BY MOUTH DAILY 02/10/23  Yes Levora Reyes SAUNDERS, MD  blood glucose meter kit and supplies KIT Dispense based on patient and insurance preference. Check your fasting blood sugars daily. (FOR ICD-9 250.00, 250.01). 12/12/16  Yes  Christopher Savannah, PA-C  empagliflozin  (JARDIANCE ) 10 MG TABS tablet Take 1 tablet (10 mg total) by mouth daily. 08/24/23  Yes Levora Reyes SAUNDERS, MD  ferrous sulfate  325 (65 FE) MG EC tablet Take 1 tablet (325 mg total) by mouth daily. Take with source of Vitamin C 08/19/23  Yes Levora Reyes SAUNDERS, MD  glucose blood [facility] ULTRA) test strip USE TO TEST FASTING BLOOD SUGARS DAILY 05/25/22  Yes Levora Reyes SAUNDERS, MD  lisinopril  (ZESTRIL ) 10 MG tablet TAKE 1 TABLET(10 MG) BY MOUTH DAILY 01/29/23  Yes Levora Reyes SAUNDERS, MD  metFORMIN  (GLUCOPHAGE ) 1000 MG tablet TAKE 1 TABLET(1000 MG) BY MOUTH TWICE DAILY WITH A MEAL 02/25/23  Yes Levora Reyes SAUNDERS, MD  omeprazole  (PRILOSEC) 20 MG capsule Take 1 capsule (20 mg total) by mouth daily. 05/14/23  Yes Levora Reyes SAUNDERS, MD  [facility] DELICA LANCETS 33G MISC USE TO TEST FASTING BLOOD SUGARS DAILY 02/23/17  Yes Christopher Savannah, PA-C   Social History   Socioeconomic History   Marital status: Married    Spouse name: Not on file   Number of children: 1   Years of education: Not on file   Highest education level: GED or equivalent  Occupational History   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    Education: [employer].   Cigarette smoker, no drug use no alcohol since [age]      Social Drivers of Corporate investment banker Strain: Low Risk  (11/18/2023)   Overall Financial  Resource Strain (CARDIA)    Difficulty of Paying Living Expenses: Not hard at all  Food Insecurity: No Food Insecurity (11/18/2023)   Hunger Vital Sign    Worried About Running Out of Food in the Last Year: Never true    Ran Out of Food in the Last Year: Never true  Transportation Needs: No Transportation Needs (11/18/2023)   PRAPARE - Administrator, Civil Service (Medical): No    Lack of Transportation (Non-Medical): No  Physical Activity: Insufficiently Active (11/18/2023)   Exercise Vital Sign    Days of Exercise per Week: 3 days    Minutes of Exercise per Session: 30 min  Stress: No Stress Concern Present (11/18/2023)  [employer] of Occupational Health - Occupational Stress Questionnaire    Feeling of Stress: Not at all  Social Connections: Socially Integrated (11/18/2023)   Social Connection and Isolation Panel    Frequency of Communication with Friends and Family: More than three times a week    Frequency of Social Gatherings with Friends and Family: Once a week    Attends Religious Services: More than 4 times per year    Active Member of [employer] or Organizations: Yes    Attends Banker Meetings: More than 4 times per year    Marital Status: Married  Catering manager Violence: Unknown (05/27/2021)   Received from [facility]   HITS    Physically Hurt: Not on file    Insult or Talk Down To: Not on file    Threaten Physical Harm: Not on file    Scream or Curse: Not on file    Review of Systems  Constitutional:  Negative for fatigue and unexpected weight change.  Eyes:  Negative for visual disturbance.  Respiratory:  Negative for cough, chest tightness and shortness of breath.   Cardiovascular:  Negative for chest pain, palpitations and leg swelling.  Gastrointestinal:  Negative for abdominal pain and blood in stool.  Neurological:  Negative for dizziness, light-headedness and headaches.     Objective:   Vitals:   11/19/23 0912  BP:  (!) 102/54  Pulse: 86  Resp: 20  Temp: 98.2 F (36.8 C)  TempSrc: Temporal  SpO2: 96%  Weight: 201 lb 3.2 oz (91.3 kg)  Height: 5' 9.5 (1.765 m)     Physical Exam Vitals reviewed.  Constitutional:      Appearance: He is well-developed.  HENT:     Head: Normocephalic and atraumatic.  Neck:     Vascular: No carotid bruit or JVD.  Cardiovascular:     Rate and Rhythm: Normal rate and regular rhythm.     Heart sounds: Normal heart sounds. No murmur heard. Pulmonary:     Effort: Pulmonary effort is normal.     Breath sounds: Normal breath sounds. No rales.  Musculoskeletal:     Right lower leg: No edema.     Left lower leg: No edema.  Skin:    General: Skin is warm and dry.  Neurological:     Mental Status: He is alert and oriented to person, place, and time.  Psychiatric:        Mood and Affect: Mood normal.        Assessment & Plan:  Ian Salazar is a 63 y.o. male . Type 2 diabetes mellitus with hyperglycemia, without long-term current use of insulin  (HCC) - Plan: Comprehensive metabolic panel with GFR, Hemoglobin A1c  - Tolerating current med regimen, including addition of Jardiance .  Will request Optho visit report.  Continue current dose metformin , Jardiance , check A1c and adjust plan accordingly with 34-month follow-up.  Essential hypertension - Plan: Comprehensive metabolic panel with GFR  - Borderline low but asymptomatic, will continue same regimen for now with RTC precautions, 25-month recheck, labs as above.  Continue follow-up with hematology, gastroenterology after capsule endoscopy.  Continue iron supplementation.  Mixed hyperlipidemia - Plan: Comprehensive metabolic panel with GFR, Lipid panel  - Check labs, adjust plan accordingly, continue same dose Lipitor for now.  Tolerating.  No orders of the defined types were placed in this encounter.  Patient Instructions  Thank you for coming in today. No change in medications at this time. If there are any  concerns  on your bloodwork, I will let you know. Take care!     Signed,   Reyes Pines, MD  Primary Care, [facility] 11/19/23 9:48 AM

## 2023-11-19 NOTE — Telephone Encounter (Signed)
-----   Message from Greig Corti sent at 11/18/2023  5:16 PM EDT ----- Regarding: Capsule  failure Dr. San - this pt came in for capsule on 10/6. Unfortunately when capsule was downloaded no video created - unclear if this was a Engineer, technical sales or if something was erased inadvertently. Neverthless we have no info to read for this capsule.  Need to be sure pt is not charged for the Capsule , and he will need to be rescheduled for another Capsule study if he is agreeable.  To Nursing - please call pt and let him know , and get him rescheduled if possible, thanks!

## 2023-11-19 NOTE — Patient Instructions (Signed)
 Thank you for coming in today. No change in medications at this time. If there are any concerns on your bloodwork, I will let you know. Take care!

## 2023-11-22 NOTE — Progress Notes (Signed)
 VCE was placed, but for unclear reasons there are no images for review. Unclear if this was due to software issues in the download or with the recording itself, but we have no images available.   Will contact patient to let him know the issue and will offer to repeat study. No charges will be entered for this.   Sandor Flatter, DO, [facility]

## 2023-11-23 NOTE — Telephone Encounter (Signed)
 Left message for pt to call back

## 2023-11-23 NOTE — Telephone Encounter (Signed)
 Patient returned call, requesting a call back to discuss. Please advise.

## 2023-11-24 ENCOUNTER — Other Ambulatory Visit: Payer: Self-pay

## 2023-11-24 DIAGNOSIS — D508 Other iron deficiency anemias: Secondary | ICD-10-CM

## 2023-11-24 NOTE — Telephone Encounter (Signed)
 Pt made aware unfortunately when capsule was downloaded, no video created - unclear if this was a Engineer, technical sales or if something was erased inadvertently. Neverthless we have no info to read for this capsule.  Pt was rescheduled for 12/09/2023 at 8:30 AM. Pt made aware. Prep instructions were sent to pt via my chart. Pt made aware.  Ambulatory referral place in Epic.  Pt verbalized understanding with all questions answered.  Routed as FYI

## 2023-11-25 ENCOUNTER — Ambulatory Visit: Payer: Self-pay | Admitting: Family Medicine

## 2023-12-09 ENCOUNTER — Ambulatory Visit: Payer: PRIVATE HEALTH INSURANCE | Admitting: Gastroenterology

## 2023-12-09 DIAGNOSIS — D509 Iron deficiency anemia, unspecified: Secondary | ICD-10-CM | POA: Diagnosis not present

## 2023-12-09 NOTE — Patient Instructions (Signed)
 You may have clear liquids beginning at 10:30 am after ingesting the capsule.    You can have a light lunch at 12:30 pm; sandwich and half bowl of soup, eggs & toast.  Return to the office at 4 pm to return the equipment.   Return to you normal diet at 5 pm.   Call [phone] and ask for [facility], [place] if you have any questions.  You should pass the capsule in your stool 8-48 hours after ingestion. If you have not passed the capsule, after 72 hours, please contact the office at [phone].

## 2023-12-09 NOTE — Progress Notes (Signed)
 Capsule ID: [id number] Exp: 2024-06-21 LOT: [id number]  Patient arrived for VCE. Reported the prep went well. This RN explained capsule dietary restrictions for the next few hours. Pt advised to return at 4 pm to return capsule equipment.  Patient verbalized understanding. Opened capsule, ensured capsule was flashing prior to the patient swallowing the capsule. Patient swallowed capsule without difficulty.  Patient told to call the office with any questions and if capsule has not passed after 72 hours. No further questions by the conclusion of the visit.

## 2023-12-25 ENCOUNTER — Other Ambulatory Visit: Payer: Self-pay | Admitting: Family Medicine

## 2023-12-25 DIAGNOSIS — I10 Essential (primary) hypertension: Secondary | ICD-10-CM

## 2023-12-25 DIAGNOSIS — E782 Mixed hyperlipidemia: Secondary | ICD-10-CM

## 2024-01-03 ENCOUNTER — Telehealth: Payer: Self-pay

## 2024-01-03 NOTE — Telephone Encounter (Signed)
 Patient returned call and was instructed to continue oral iron daily.  Patient advised to continue follow up with Hematology Clinic as planned.  Patient agreed to plan and verbalized understanding.  No further questions or concerns.

## 2024-01-03 NOTE — Telephone Encounter (Signed)
-----   Message from Sandor LULLA Flatter sent at 01/03/2024  1:02 PM EST ----- Please contact this patient to let him know that the results from the video Capsule Endoscopy were reviewed and notable for the following:  Findings: 1) Complete capsule endoscopy with adequate prep 2) First duodenal image at 2 hours 36 minutes.  First ileocecal valve image at 4 hours 44 minutes.  Small bowel transit time approximately 2 hours 8 minutes. 3) Negative study without any active bleeding or high-grade stigmata of bleeding noted throughout the small bowel  Summary and Recommendations: Complete capsule endoscopy was good views of the small bowel.  No active bleeding or high-grade stigmata of bleeding noted on this study. - Continue oral iron therapy - Continue follow-up in the Hematology Clinic  Thanks.

## 2024-01-03 NOTE — Progress Notes (Signed)
 Video Capsule Endoscopy results reviewed and notable for the following:  Findings: 1) Complete capsule endoscopy with adequate prep 2) First duodenal image at 2 hours 36 minutes.  First ileocecal valve image at 4 hours 44 minutes.  Small bowel transit time approximately 2 hours 8 minutes. 3) Negative study without any active bleeding or high-grade stigmata of bleeding noted throughout the small bowel  Summary and Recommendations: Complete capsule endoscopy was good views of the small bowel.  No active bleeding or high-grade stigmata of bleeding noted on this study. - Continue oral iron therapy - Continue follow-up in the Hematology Clinic  [place], DO, [facility]

## 2024-01-03 NOTE — Telephone Encounter (Signed)
 Left message on voicemail for patient to return call to further discuss results.  Will continue efforts.

## 2024-01-18 ENCOUNTER — Encounter: Payer: Self-pay | Admitting: Gastroenterology

## 2024-02-04 ENCOUNTER — Other Ambulatory Visit: Payer: Self-pay | Admitting: Family Medicine

## 2024-02-04 DIAGNOSIS — K219 Gastro-esophageal reflux disease without esophagitis: Secondary | ICD-10-CM

## 2024-02-18 ENCOUNTER — Inpatient Hospital Stay: Payer: PRIVATE HEALTH INSURANCE | Attending: Hematology and Oncology

## 2024-02-18 DIAGNOSIS — Z800 Family history of malignant neoplasm of digestive organs: Secondary | ICD-10-CM | POA: Insufficient documentation

## 2024-02-18 DIAGNOSIS — Z87891 Personal history of nicotine dependence: Secondary | ICD-10-CM | POA: Insufficient documentation

## 2024-02-18 DIAGNOSIS — D509 Iron deficiency anemia, unspecified: Secondary | ICD-10-CM | POA: Insufficient documentation

## 2024-02-18 DIAGNOSIS — Z801 Family history of malignant neoplasm of trachea, bronchus and lung: Secondary | ICD-10-CM | POA: Insufficient documentation

## 2024-02-21 ENCOUNTER — Encounter: Payer: Self-pay | Admitting: Family Medicine

## 2024-02-21 ENCOUNTER — Ambulatory Visit: Payer: PRIVATE HEALTH INSURANCE | Admitting: Family Medicine

## 2024-02-21 VITALS — BP 108/58 | HR 77 | Temp 98.7°F | Resp 16 | Ht 69.5 in | Wt 200.4 lb

## 2024-02-21 DIAGNOSIS — Z7984 Long term (current) use of oral hypoglycemic drugs: Secondary | ICD-10-CM

## 2024-02-21 DIAGNOSIS — Z23 Encounter for immunization: Secondary | ICD-10-CM | POA: Diagnosis not present

## 2024-02-21 DIAGNOSIS — H6123 Impacted cerumen, bilateral: Secondary | ICD-10-CM

## 2024-02-21 DIAGNOSIS — I10 Essential (primary) hypertension: Secondary | ICD-10-CM | POA: Diagnosis not present

## 2024-02-21 DIAGNOSIS — E1165 Type 2 diabetes mellitus with hyperglycemia: Secondary | ICD-10-CM | POA: Diagnosis not present

## 2024-02-21 DIAGNOSIS — K219 Gastro-esophageal reflux disease without esophagitis: Secondary | ICD-10-CM

## 2024-02-21 DIAGNOSIS — D500 Iron deficiency anemia secondary to blood loss (chronic): Secondary | ICD-10-CM | POA: Diagnosis not present

## 2024-02-21 DIAGNOSIS — E782 Mixed hyperlipidemia: Secondary | ICD-10-CM

## 2024-02-21 MED ORDER — LISINOPRIL 10 MG PO TABS: ORAL_TABLET | ORAL | 2 refills | Status: AC

## 2024-02-21 MED ORDER — EMPAGLIFLOZIN 10 MG PO TABS: 10.0000 mg | ORAL_TABLET | Freq: Every day | ORAL | 1 refills | Status: AC

## 2024-02-21 NOTE — Patient Instructions (Addendum)
 Thank you for coming in today. No change in medications at this time, but work on diet and we can recheck your A1c in 3 months.  If it has improved at that time we will keep everything the same, otherwise we will add a medication.  There is a link below for a discount card for Jardiance  if card in office does not work, see if that is more cost effective.  Let me know if it is still costly and we can look at options.  See information below on excess cerumen (earwax).  Over-the-counter Debrox can be helpful every 6 months or so to lessen the chance of that recurring.  If any pain in the ears tonight or tomorrow, be evaluated but I do not expect that to occur.  I do not see any residual wax at this time.  Take care!   [email or web address]  Earwax Buildup, Adult Your ears make something called earwax. It helps keep germs called bacteria away and protects the skin in your ears. Sometimes, too much earwax can build up. This can cause discomfort or make it harder to hear. What are the causes? Earwax buildup can happen when you have too much earwax in your ears. Earwax is made in the outer part of your ear canal. It's supposed to fall out in small amounts over time. But if your ears aren't able to clean themselves like they should, earwax can build up. What increases the risk? You're more likely to get earwax buildup if: You clean your ears with cotton swabs. You pick at your ears. You use earplugs or in-ear headphones a lot. You wear hearing aids. You may also be more likely to get it if: You're male. You're older. Your ears naturally make more earwax. You have narrow ear canals or extra hair in your ears. Your earwax is too thick or sticky. You have eczema. You're dehydrated. This means there's not enough fluid in your body. What are the signs or symptoms? Symptoms of earwax buildup include: Not being able to hear as well. A feeling of fullness  in your ear. Feeling like your ear is plugged. Fluid coming from your ear. Ear pain or an itchy ear. Ringing in your ear. Coughing or problems with balance. How is this diagnosed? Earwax buildup may be diagnosed based on your symptoms, medical history, and an ear exam. During the exam, your health care provider will look into your ear with a tool called an otoscope. You may also have tests, such as a hearing test. How is this treated? Earwax buildup may be treated by: Using ear drops. Having the earwax removed by a provider. The provider may: Flush the ear with water. Use a tool called a curette that has a loop on the end. Use a suction device. Having surgery. This may be done in severe cases. Follow these instructions at home:  Cleaning your ears Clean your ears as told by your provider. You can clean the outside of your ears with a washcloth or tissue. Do not overclean your ears. Do not put anything into your ear unless told. This includes cotton swabs. General instructions Take over-the-counter and prescription medicines only as told by your provider. Drink enough fluid to keep your pee (urine) pale yellow. This helps thin the earwax. If you have hearing aids, clean them as told. Keep all follow-up visits. If earwax builds up in your ears often or if you use hearing aids, ask your provider how often you should have your ears cleaned.  Contact a health care provider if: Your ear pain gets worse. You have a fever. You have pus, blood, or other fluid coming from your ear. You have hearing loss. You have ringing in your ears that won't go away. You feel like the room is spinning. This is called vertigo. Your symptoms don't get better with treatment. This information is not intended to replace advice given to you by your health care provider. Make sure you discuss any questions you have with your health care provider. Document Revised: 04/02/2022 Document Reviewed:  04/02/2022 Elsevier Patient Education  2024 [employer].

## 2024-02-21 NOTE — Progress Notes (Signed)
 "  Subjective:  Patient ID: Ian Salazar, male    DOB: 09/29/1960  Age: 64 y.o. MRN: [id number]  CC:  Chief Complaint  Patient presents with   Follow-up    3 month med check and labs. No questions today. Pt reports sugars are great. Jaurdaince is $99. Looking for a cheaper option if he needs to be on it    HPI Ian Salazar presents for follow-up.  Corporate physical through [facility] on 01/11/2024.  Labs obtained at that time.  Glucose 112, remainder of CMP normal.  A1c 7.5.  CBC normal.  Total cholesterol 120, HDL 55, LDL 54.  Diabetes: With hyperglycemia.  Metformin  1000 mg twice daily, Jardiance  10 mg daily.  He is on ACE inhibitor with lisinopril , statin with Lipitor. Recent A1c slightly higher with holidays. Room for improvement. Walking for exercise, active at work.  She denies mycotic or urinary tract infection symptoms with Jardiance  or other new side effects with metformin .  Above Jardiance  has been more costly. Fasting readings, Postprandial - no recent readings.  No symptomatic lows. Microalbumin: Normal 04/28/2023 Optho, foot exam, pneumovax:  Flu vaccine today - recommended covid booster at pharmacy.  Lab Results  Component Value Date   HGBA1C 7.0 (H) 11/19/2023   HGBA1C 7.9 (H) 08/19/2023   HGBA1C 8.0 (H) 04/28/2023   Lab Results  Component Value Date   MICROALBUR -0.1 (L) 04/28/2023   LDLCALC 56 11/19/2023   CREATININE 0.88 11/19/2023   Hypertension: Lisinopril  10 mg daily without new cough or side effects, no lightheadedness/dizziness.  Home readings: BP Readings from Last 3 Encounters:  02/21/24 (!) 108/58  11/19/23 (!) 102/54  11/17/23 118/72   Lab Results  Component Value Date   CREATININE 0.88 11/19/2023   Hyperlipidemia: Recent labs with [facility] as above, treated with Lipitor 20 mg daily without any new side effects. Lab Results  Component Value Date   CHOL 119 11/19/2023   HDL 54.10 11/19/2023   LDLCALC 56 11/19/2023   TRIG  42.0 11/19/2023   CHOLHDL 2 11/19/2023   Lab Results  Component Value Date   ALT 17 11/19/2023   AST 15 11/19/2023   ALKPHOS 58 11/19/2023   BILITOT 0.4 11/19/2023   Iron deficiency anemia Followed by hematology, gastroenterology, thought to be due to gastrointestinal bleeding.  Treated with iron therapy.  Recent CBC through [facility] was normal.  Hemoglobin 14.3 on 01/11/2024.  Continues on omeprazole  with GERD, no breakthrough sx's.  Stable.on iron every day.   Ear blockage: Both sides past 3 weeks.  No otx tx yet.  Requests lavage in office.  We discussed potential risks/benefits/alternatives of ear lavage including but not limited to TM perforation, canal irritation although unlikely, and option to meet with ENT.  He would like to have lavage performed in office, verbal consent obtained for bilateral cerumen lavage, performed by CMA.   History Patient Active Problem List   Diagnosis Date Noted   Body mass index (BMI) 30.0-30.9, adult 04/28/2023   Gastro-esophageal reflux disease without esophagitis 04/28/2023   Hyperlipidemia 04/28/2023   Family history of pancreatic cancer 12/09/2020   Pain of left heel 03/07/2020   Hx of adenomatous colonic polyps 03/28/2015   History of adenomatous polyp of colon 03/28/2015   Routine general medical examination at a health care facility 07/17/2011   Mixed hyperlipidemia 07/17/2011   Abnormal chest x-ray 07/17/2011   Vitamin B 12 deficiency 04/22/2010   ANEMIA-NOS 04/18/2010   Anemia 04/18/2010   DEGENERATIVE JOINT DISEASE,  KNEE 10/24/2008   Osteoarthritis 10/24/2008   DM w/o complication type II 02/27/2008   Past Medical History:  Diagnosis Date   Abnormal EKG    Normal ETT 5 years ago   Anemia    Family history of pancreatic cancer    GERD (gastroesophageal reflux disease)    occasional   Hx of adenomatous colonic polyps 03/28/2015   Hyperlipidemia    Hypertension 99999999   Knee pain, right    Rotator cuff tendonitis    Left    Type II or unspecified type diabetes mellitus without mention of complication, not stated as uncontrolled    Past Surgical History:  Procedure Laterality Date   COLONOSCOPY     COLONOSCOPY W/ POLYPECTOMY  03/25/2015   MULTIPLE TOOTH EXTRACTIONS     POLYPECTOMY     UPPER GASTROINTESTINAL ENDOSCOPY  10/17/2019   Allergies[1] Prior to Admission medications  Medication Sig Start Date End Date Taking? Authorizing Provider  atorvastatin  (LIPITOR) 20 MG tablet TAKE 1 TABLET(20 MG) BY MOUTH DAILY 12/27/23  Yes Ian Ian SAUNDERS, MD  blood glucose meter kit and supplies KIT Dispense based on patient and insurance preference. Check your fasting blood sugars daily. (FOR ICD-9 250.00, 250.01). 12/12/16  Yes Ian Savannah, PA-C  empagliflozin  (JARDIANCE ) 10 MG TABS tablet Take 1 tablet (10 mg total) by mouth daily. 08/24/23  Yes Ian Ian SAUNDERS, MD  ferrous sulfate  325 (65 FE) MG EC tablet Take 1 tablet (325 mg total) by mouth daily. Take with source of Vitamin C 08/19/23  Yes Ian Ian SAUNDERS, MD  glucose blood [facility] ULTRA) test strip USE TO TEST FASTING BLOOD SUGARS DAILY 05/25/22  Yes Ian Ian SAUNDERS, MD  lisinopril  (ZESTRIL ) 10 MG tablet TAKE 1 TABLET(10 MG) BY MOUTH DAILY 01/29/23  Yes Ian Ian SAUNDERS, MD  metFORMIN  (GLUCOPHAGE ) 1000 MG tablet TAKE 1 TABLET(1000 MG) BY MOUTH TWICE DAILY WITH A MEAL 12/27/23  Yes Ian Ian SAUNDERS, MD  omeprazole  (PRILOSEC) 20 MG capsule TAKE 1 CAPSULE(20 MG) BY MOUTH DAILY 02/04/24  Yes Ian Ian SAUNDERS, MD  [facility] DELICA LANCETS 33G MISC USE TO TEST FASTING BLOOD SUGARS DAILY 02/23/17  Yes Ian Savannah, PA-C   Social History   Socioeconomic History   Marital status: Married    Spouse name: Not on file   Number of children: 1   Years of education: Not on file   Highest education level: GED or equivalent  Occupational History   Occupation: [employer]  Tobacco Use   Smoking status: Former    Current packs/day: 0.75    Average packs/day: 0.8 packs/day  for 48.0 years (36.0 ttl pk-yrs)    Types: Cigarettes   Smokeless tobacco: Never  Vaping Use   Vaping status: Never Used  Substance and Sexual Activity   Alcohol use: Not Currently    Comment: 09/07/19-none in 2 1/2 years   Drug use: No   Sexual activity: Yes  Other Topics Concern   Not on file  Social History Narrative   Married.  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    Education: [employer].   Cigarette smoker, no drug use no alcohol since [age]      Social Drivers of Health   Tobacco Use: Medium Risk (02/21/2024)   Patient History    Smoking Tobacco Use: Former    Smokeless Tobacco  Use: Never    Passive Exposure: Not on file  Financial Resource Strain: Low Risk (11/18/2023)   Overall Financial Resource Strain (CARDIA)    Difficulty of Paying Living Expenses: Not hard at all  Food Insecurity: No Food Insecurity (11/18/2023)   Epic    Worried About Programme Researcher, Broadcasting/film/video in the Last Year: Never true    Ran Out of Food in the Last Year: Never true  Transportation Needs: No Transportation Needs (11/18/2023)   Epic    Lack of Transportation (Medical): No    Lack of Transportation (Non-Medical): No  Physical Activity: Insufficiently Active (11/18/2023)   Exercise Vital Sign    Days of Exercise per Week: 3 days    Minutes of Exercise per Session: 30 min  Stress: No Stress Concern Present (11/18/2023)   [employer] of Occupational Health - Occupational Stress Questionnaire    Feeling of Stress: Not at all  Social Connections: Socially Integrated (11/18/2023)   Social Connection and Isolation Panel    Frequency of Communication with Friends and Family: More than three times a week    Frequency of Social Gatherings with Friends and Family: Once a week    Attends Religious Services: More than 4 times per year    Active Member of [employer] or  Organizations: Yes    Attends Banker Meetings: More than 4 times per year    Marital Status: Married  Catering Manager Violence: Not on file  Depression (PHQ2-9): Low Risk (11/19/2023)   Depression (PHQ2-9)    PHQ-2 Score: 0  Alcohol Screen: Not on file  Housing: Unknown (11/18/2023)   Epic    Unable to Pay for Housing in the Last Year: No    Number of Times Moved in the Last Year: Not on file    Homeless in the Last Year: No  Utilities: Not on file  Health Literacy: Not on file    Review of Systems   Objective:   Vitals:   02/21/24 0803  BP: (!) 108/58  Pulse: 77  Resp: 16  Temp: 98.7 F (37.1 C)  TempSrc: Temporal  SpO2: 97%  Weight: 200 lb 6.4 oz (90.9 kg)  Height: 5' 9.5 (1.765 m)     Physical Exam Vitals reviewed.  Constitutional:      Appearance: He is well-developed.  HENT:     Head: Normocephalic and atraumatic.     Right Ear: There is impacted cerumen.     Left Ear: There is impacted cerumen.  Neck:     Vascular: No carotid bruit or JVD.  Cardiovascular:     Rate and Rhythm: Normal rate and regular rhythm.     Heart sounds: Normal heart sounds. No murmur heard. Pulmonary:     Effort: Pulmonary effort is normal.     Breath sounds: Normal breath sounds. No rales.  Musculoskeletal:     Right lower leg: No edema.     Left lower leg: No edema.  Skin:    General: Skin is warm and dry.  Neurological:     Mental Status: He is alert and oriented to person, place, and time.  Psychiatric:        Mood and Affect: Mood normal.    Cerumen lavage after verbal consent obtained as above.  Repeat exam with clear canals, slight erythema canals but no abrasion, TMs intact.  Denies any discomfort.  RTC precautions given.   Assessment & Plan:  Ian Salazar is a 64 y.o. male . Gastro-esophageal reflux  disease without esophagitis  - Stable on PPI, continue same.  Type 2 diabetes mellitus with hyperglycemia, without long-term current use of  insulin  (HCC)  - A1c recently increased on outside labs.  Suspect decreased diet adherence during holidays.  He does plan on improving diet, staying active and we will recheck A1c in 3 months.  No changes for now.  Discount card provided for Jardiance , advised to contact me if that is ineffective.  No change in metformin .  Essential hypertension  - Stable with borderline low readings.  Asymptomatic.  RTC precautions if any orthostatic or hypotensive symptoms and recheck in 3 months.  No med changes for now.  Mixed hyperlipidemia  - Tolerating statin, recent labs noted as above.  No changes  Iron deficiency anemia due to chronic blood loss  - Stable hemoglobin on recent testing, continue iron  Bilateral impacted cerumen  - New concern today.  Bilateral impacted cerumen lavaged as above with resolution of symptoms, no complications.  Handout given with option of Debrox every 6 months or so to lessen chance of recurrence.  RTC precautions given after lavage in office today.  No orders of the defined types were placed in this encounter.  Patient Instructions  Thank you for coming in today. No change in medications at this time, but work on diet and we can recheck your A1c in 3 months.  If it has improved at that time we will keep everything the same, otherwise we will add a medication.  There is a link below for a discount card for Jardiance  if card in office does not work, see if that is more cost effective.  Let me know if it is still costly and we can look at options.  See information below on excess cerumen (earwax).  Over-the-counter Debrox can be helpful every 6 months or so to lessen the chance of that recurring.  Take care!   [email or web address]    Signed,   Ian Pines, MD [facility], [facility] 02/21/24 8:39 AM      [1] No Known Allergies  "

## 2024-02-21 NOTE — Addendum Note (Signed)
 Addended by: LEVORA PURCHASE R on: 02/21/2024 09:34 AM   Modules accepted: Orders

## 2024-03-03 ENCOUNTER — Inpatient Hospital Stay: Payer: PRIVATE HEALTH INSURANCE

## 2024-03-03 DIAGNOSIS — Z87891 Personal history of nicotine dependence: Secondary | ICD-10-CM | POA: Diagnosis not present

## 2024-03-03 DIAGNOSIS — Z801 Family history of malignant neoplasm of trachea, bronchus and lung: Secondary | ICD-10-CM | POA: Diagnosis not present

## 2024-03-03 DIAGNOSIS — D509 Iron deficiency anemia, unspecified: Secondary | ICD-10-CM | POA: Diagnosis present

## 2024-03-03 DIAGNOSIS — Z800 Family history of malignant neoplasm of digestive organs: Secondary | ICD-10-CM | POA: Diagnosis not present

## 2024-03-03 DIAGNOSIS — D500 Iron deficiency anemia secondary to blood loss (chronic): Secondary | ICD-10-CM

## 2024-03-03 LAB — CBC WITH DIFFERENTIAL (CANCER CENTER ONLY)
Abs Immature Granulocytes: 0.01 10*3/uL (ref 0.00–0.07)
Basophils Absolute: 0 10*3/uL (ref 0.0–0.1)
Basophils Relative: 1 %
Eosinophils Absolute: 0 10*3/uL (ref 0.0–0.5)
Eosinophils Relative: 1 %
HCT: 43.3 % (ref 39.0–52.0)
Hemoglobin: 14 g/dL (ref 13.0–17.0)
Immature Granulocytes: 0 %
Lymphocytes Relative: 23 %
Lymphs Abs: 1 10*3/uL (ref 0.7–4.0)
MCH: 28 pg (ref 26.0–34.0)
MCHC: 32.3 g/dL (ref 30.0–36.0)
MCV: 86.6 fL (ref 80.0–100.0)
Monocytes Absolute: 0.5 10*3/uL (ref 0.1–1.0)
Monocytes Relative: 11 %
Neutro Abs: 2.7 10*3/uL (ref 1.7–7.7)
Neutrophils Relative %: 64 %
Platelet Count: 151 10*3/uL (ref 150–400)
RBC: 5 MIL/uL (ref 4.22–5.81)
RDW: 13.5 % (ref 11.5–15.5)
WBC Count: 4.3 10*3/uL (ref 4.0–10.5)
nRBC: 0 % (ref 0.0–0.2)

## 2024-03-03 LAB — FERRITIN: Ferritin: 53 ng/mL (ref 24–336)

## 2024-03-03 LAB — RETIC PANEL
Immature Retic Fract: 6.2 % (ref 2.3–15.9)
RBC.: 5.04 MIL/uL (ref 4.22–5.81)
Retic Count, Absolute: 74.6 10*3/uL (ref 19.0–186.0)
Retic Ct Pct: 1.5 % (ref 0.4–3.1)
Reticulocyte Hemoglobin: 30.7 pg

## 2024-03-03 LAB — CMP (CANCER CENTER ONLY)
ALT: 20 U/L (ref 0–44)
AST: 22 U/L (ref 15–41)
Albumin: 4.4 g/dL (ref 3.5–5.0)
Alkaline Phosphatase: 69 U/L (ref 38–126)
Anion gap: 11 (ref 5–15)
BUN: 12 mg/dL (ref 8–23)
CO2: 27 mmol/L (ref 22–32)
Calcium: 9.7 mg/dL (ref 8.9–10.3)
Chloride: 104 mmol/L (ref 98–111)
Creatinine: 0.94 mg/dL (ref 0.61–1.24)
GFR, Estimated: >60 mL/min
Glucose, Bld: 96 mg/dL (ref 70–99)
Potassium: 4.2 mmol/L (ref 3.5–5.1)
Sodium: 142 mmol/L (ref 135–145)
Total Bilirubin: 0.3 mg/dL (ref 0.0–1.2)
Total Protein: 7.3 g/dL (ref 6.5–8.1)

## 2024-03-03 LAB — IRON AND IRON BINDING CAPACITY (CC-WL,HP ONLY)
Iron: 68 ug/dL (ref 45–182)
Saturation Ratios: 20 % (ref 17.9–39.5)
TIBC: 333 ug/dL (ref 250–450)
UIBC: 266 ug/dL

## 2024-05-19 ENCOUNTER — Inpatient Hospital Stay: Payer: PRIVATE HEALTH INSURANCE | Attending: Hematology and Oncology

## 2024-05-22 ENCOUNTER — Ambulatory Visit: Payer: PRIVATE HEALTH INSURANCE | Admitting: Family Medicine

## 2024-05-26 ENCOUNTER — Inpatient Hospital Stay: Payer: PRIVATE HEALTH INSURANCE | Admitting: Hematology and Oncology
